# Patient Record
Sex: Male | Born: 1960 | Race: White | Hispanic: No | Marital: Married | Smoking: Current every day smoker
Health system: Southern US, Community
[De-identification: ages and names within clinical notes are randomized; demographics above are authoritative.]

## PROBLEM LIST (undated history)

## (undated) DIAGNOSIS — G459 Transient cerebral ischemic attack, unspecified: Secondary | ICD-10-CM

---

## 2006-01-21 ENCOUNTER — Ambulatory Visit: Payer: Self-pay | Admitting: Gastroenterology

## 2006-04-22 ENCOUNTER — Ambulatory Visit: Payer: Self-pay | Admitting: Gastroenterology

## 2006-06-04 ENCOUNTER — Encounter (INDEPENDENT_AMBULATORY_CARE_PROVIDER_SITE_OTHER): Payer: Self-pay | Admitting: Specialist

## 2006-06-04 ENCOUNTER — Ambulatory Visit: Admission: RE | Admit: 2006-06-04 | Discharge: 2006-06-04 | Payer: Self-pay | Admitting: Gastroenterology

## 2006-06-04 IMAGING — US US BIOPSY
1 series · 10 of 10 positions shown · non-contrast
Comparison: none

CLINICAL DATA: Hepatitis C.  Request is made for random core liver biopsy.
ULTRASOUND-GUIDED RANDOM CORE LIVER BIOPSY ? [DATE]:

[Series 1: unknown · 0.33mm/px · 10 of 10 slices shown]
[im 1/10]
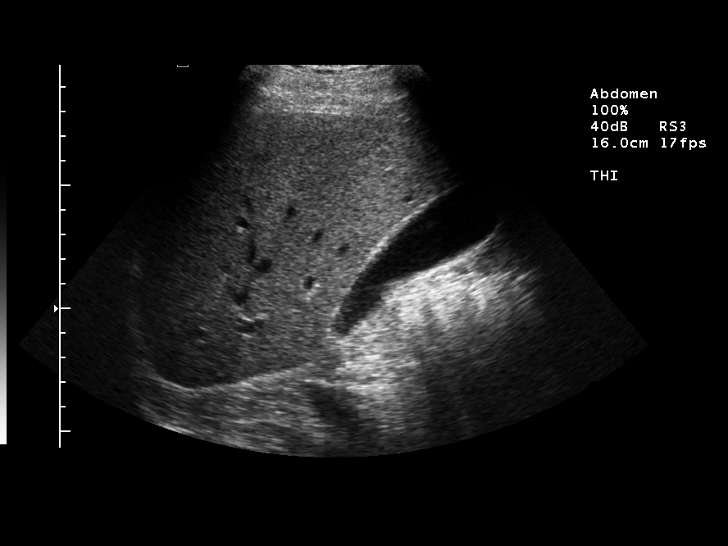
[im 2/10]
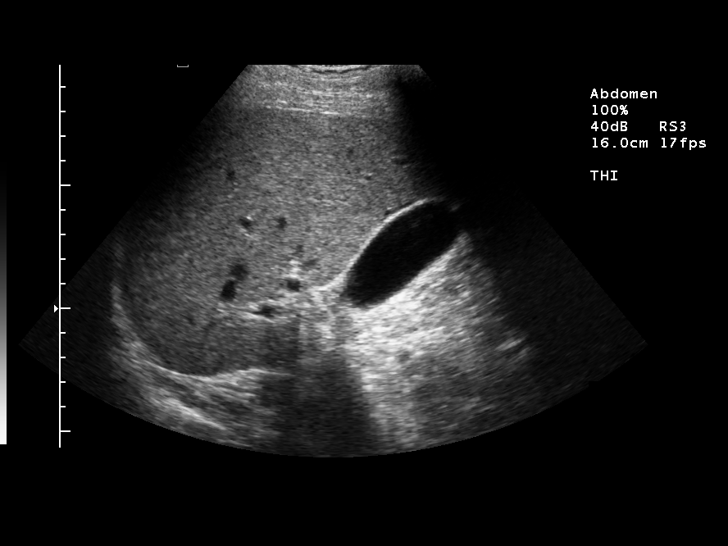
[im 3/10]
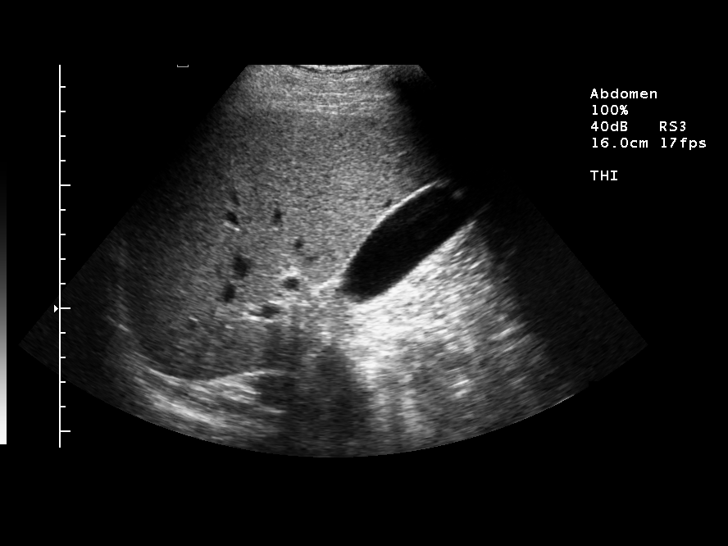
[im 4/10]
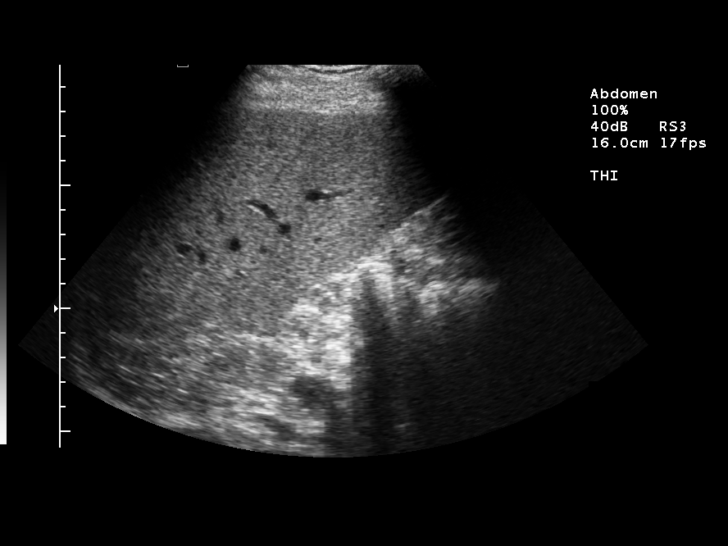
[im 5/10]
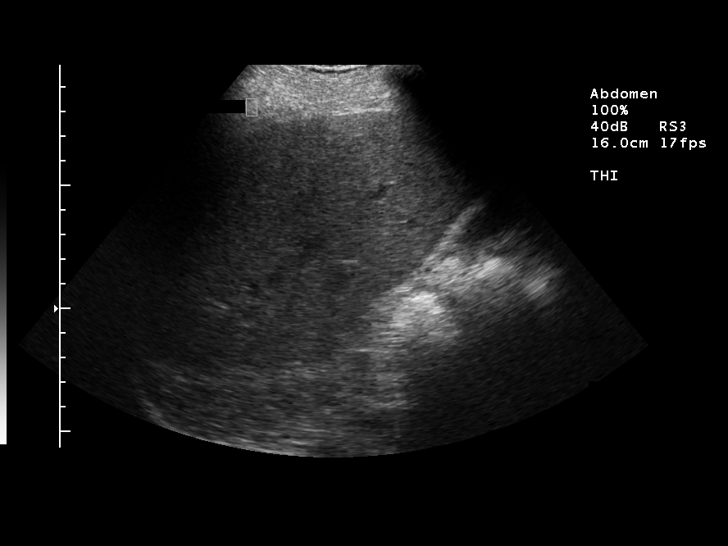
[im 6/10]
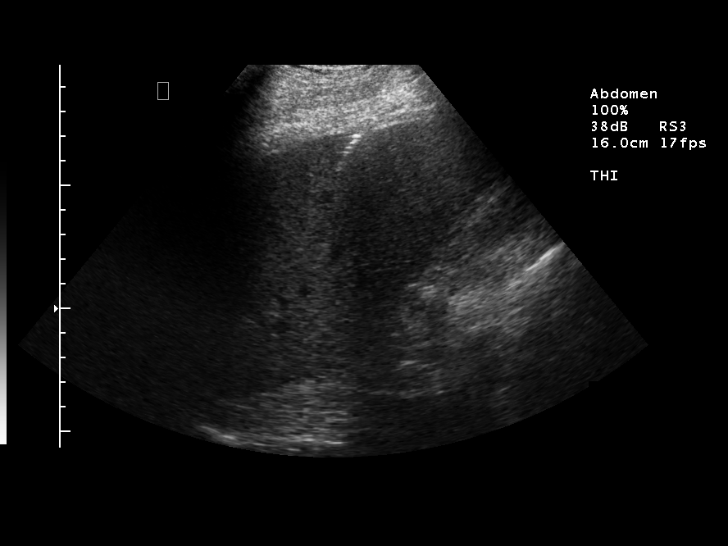
[im 7/10]
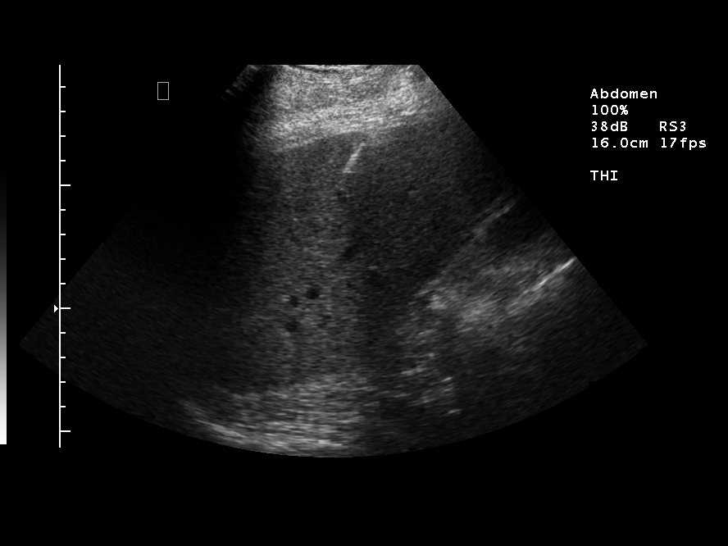
[im 8/10]
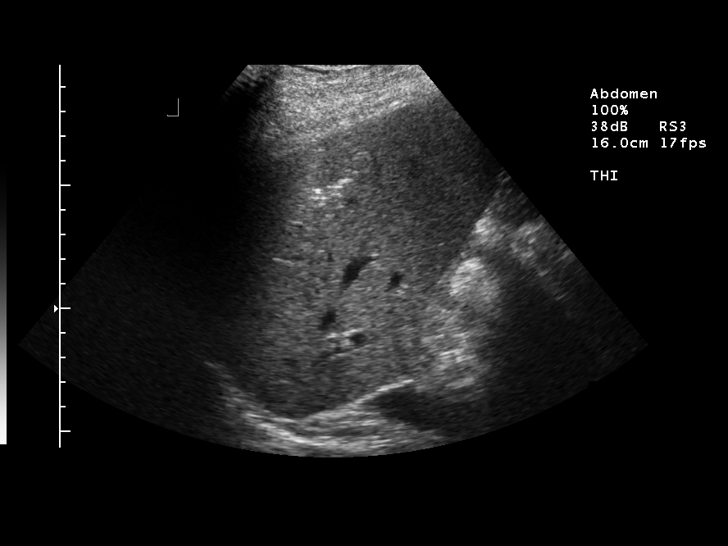
[im 9/10]
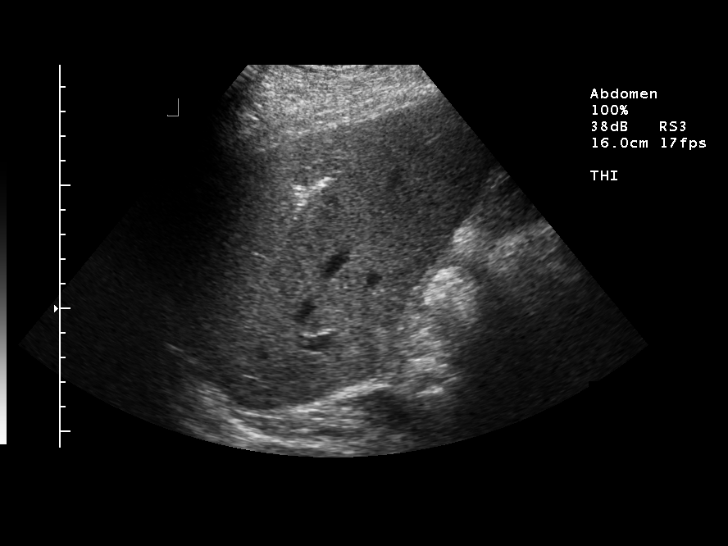
[im 10/10]
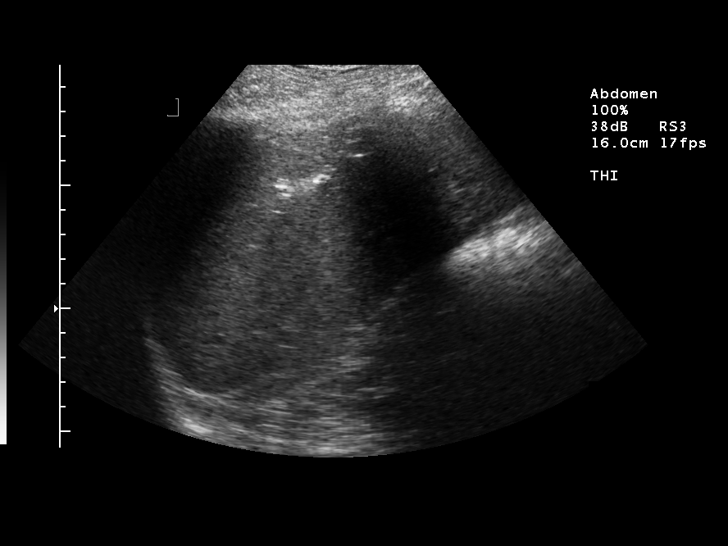

[10 of 10 positions shown; findings below may reference images not displayed]

FINDINGS: An ultrasound-guided random core liver biopsy was thoroughly discussed with the patient and questions were answered.  The benefits, risks, alternatives, and complications were also discussed.  The patient understands and wishes to proceed with the procedure.  Verbal as well as written consent was obtained.
Under ultrasound guidance, an appropriate skin site was marked.  The patient was then prepped and draped in the normal sterile fashion.  1% lidocaine was used for local anesthesia.  Through a 17-gauge guiding trocar, 4 passes were then made into the right hepatic lobe with an 18-gauge biopsy gun.  Ultrasound imaging confirmed appropriate needle placement in the liver parenchyma.  Specimens were sent to pathology for further evaluation.  The patient tolerated the procedure well and there were no immediate complications.  
Medications utilized:  Versed 4 mg IV, fentanyl 100 micrograms IV.  Cardiorespiratory monitoring was performed by the interventional radiology nurse during the procedure.  The patient?s vital signs remained stable throughout the procedure, and he will be observed in the [REDACTED] for an additional 3 hours post-biopsy and then discharged home afterwards if stable.
Total Time of Sedation:  20 minutes.
IMPRESSION: Successful ultrasound-guided random core liver biopsy of the right hepatic lobe as discussed above.

## 2006-07-08 ENCOUNTER — Ambulatory Visit: Payer: Self-pay | Admitting: Gastroenterology

## 2006-10-02 ENCOUNTER — Ambulatory Visit: Payer: Self-pay | Admitting: Gastroenterology

## 2006-11-13 ENCOUNTER — Ambulatory Visit: Payer: Self-pay | Admitting: Gastroenterology

## 2007-01-06 ENCOUNTER — Ambulatory Visit: Payer: Self-pay | Admitting: Gastroenterology

## 2007-04-23 ENCOUNTER — Ambulatory Visit: Payer: Self-pay | Admitting: Gastroenterology

## 2007-04-30 ENCOUNTER — Ambulatory Visit: Payer: Self-pay | Admitting: Gastroenterology

## 2007-05-14 ENCOUNTER — Ambulatory Visit: Payer: Self-pay | Admitting: Gastroenterology

## 2007-05-28 ENCOUNTER — Ambulatory Visit: Payer: Self-pay | Admitting: Gastroenterology

## 2007-06-11 ENCOUNTER — Ambulatory Visit: Payer: Self-pay | Admitting: Gastroenterology

## 2007-07-09 ENCOUNTER — Ambulatory Visit: Payer: Self-pay | Admitting: Gastroenterology

## 2007-08-06 ENCOUNTER — Ambulatory Visit: Payer: Self-pay | Admitting: Gastroenterology

## 2007-09-03 ENCOUNTER — Ambulatory Visit: Payer: Self-pay | Admitting: Gastroenterology

## 2007-10-01 ENCOUNTER — Ambulatory Visit: Payer: Self-pay | Admitting: Gastroenterology

## 2007-10-29 ENCOUNTER — Ambulatory Visit: Payer: Self-pay | Admitting: Gastroenterology

## 2007-11-26 ENCOUNTER — Ambulatory Visit: Payer: Self-pay | Admitting: Gastroenterology

## 2007-12-24 ENCOUNTER — Ambulatory Visit: Payer: Self-pay | Admitting: Gastroenterology

## 2008-01-26 ENCOUNTER — Ambulatory Visit: Payer: Self-pay | Admitting: Gastroenterology

## 2008-03-03 ENCOUNTER — Ambulatory Visit: Payer: Self-pay | Admitting: Gastroenterology

## 2008-06-30 ENCOUNTER — Ambulatory Visit: Payer: Self-pay | Admitting: Gastroenterology

## 2009-05-11 ENCOUNTER — Ambulatory Visit: Payer: Self-pay | Admitting: Gastroenterology

## 2019-09-09 ENCOUNTER — Encounter (HOSPITAL_COMMUNITY): Payer: Self-pay

## 2019-09-09 ENCOUNTER — Emergency Department (HOSPITAL_COMMUNITY): Payer: Worker's Compensation

## 2019-09-09 ENCOUNTER — Emergency Department (HOSPITAL_COMMUNITY)
Admission: EM | Admit: 2019-09-09 | Discharge: 2019-09-09 | Disposition: A | Discharge status: Home / Self Care | Payer: Worker's Compensation | Attending: Emergency Medicine | Admitting: Emergency Medicine

## 2019-09-09 DIAGNOSIS — S4991XA Unspecified injury of right shoulder and upper arm, initial encounter: Secondary | ICD-10-CM | POA: Insufficient documentation

## 2019-09-09 DIAGNOSIS — Y929 Unspecified place or not applicable: Secondary | ICD-10-CM | POA: Diagnosis not present

## 2019-09-09 DIAGNOSIS — M25511 Pain in right shoulder: Secondary | ICD-10-CM | POA: Insufficient documentation

## 2019-09-09 DIAGNOSIS — Y9389 Activity, other specified: Secondary | ICD-10-CM | POA: Diagnosis not present

## 2019-09-09 DIAGNOSIS — S0083XA Contusion of other part of head, initial encounter: Secondary | ICD-10-CM | POA: Insufficient documentation

## 2019-09-09 DIAGNOSIS — W010XXA Fall on same level from slipping, tripping and stumbling without subsequent striking against object, initial encounter: Secondary | ICD-10-CM | POA: Diagnosis not present

## 2019-09-09 DIAGNOSIS — Z7982 Long term (current) use of aspirin: Secondary | ICD-10-CM | POA: Insufficient documentation

## 2019-09-09 DIAGNOSIS — Y990 Civilian activity done for income or pay: Secondary | ICD-10-CM | POA: Insufficient documentation

## 2019-09-09 DIAGNOSIS — S0990XA Unspecified injury of head, initial encounter: Secondary | ICD-10-CM

## 2019-09-09 DIAGNOSIS — W19XXXA Unspecified fall, initial encounter: Secondary | ICD-10-CM

## 2019-09-09 DIAGNOSIS — Z8673 Personal history of transient ischemic attack (TIA), and cerebral infarction without residual deficits: Secondary | ICD-10-CM | POA: Insufficient documentation

## 2019-09-09 HISTORY — DX: Transient cerebral ischemic attack, unspecified: G45.9

## 2019-09-09 LAB — CBC
HCT: 40.7 % (ref 39.0–52.0)
Hemoglobin: 13.1 g/dL (ref 13.0–17.0)
MCH: 31.4 pg (ref 26.0–34.0)
MCHC: 32.2 g/dL (ref 30.0–36.0)
MCV: 97.6 fL (ref 80.0–100.0)
Platelets: 195 10*3/uL (ref 150–400)
RBC: 4.17 MIL/uL — ABNORMAL LOW (ref 4.22–5.81)
RDW: 13.8 % (ref 11.5–15.5)
WBC: 4.6 10*3/uL (ref 4.0–10.5)
nRBC: 0 % (ref 0.0–0.2)

## 2019-09-09 LAB — COMPREHENSIVE METABOLIC PANEL
ALT: 21 U/L (ref 0–44)
AST: 19 U/L (ref 15–41)
Albumin: 3.6 g/dL (ref 3.5–5.0)
Alkaline Phosphatase: 68 U/L (ref 38–126)
Anion gap: 10 (ref 5–15)
BUN: 17 mg/dL (ref 6–20)
CO2: 23 mmol/L (ref 22–32)
Calcium: 9 mg/dL (ref 8.9–10.3)
Chloride: 103 mmol/L (ref 98–111)
Creatinine, Ser: 1.15 mg/dL (ref 0.61–1.24)
GFR calc Af Amer: >60 mL/min (ref >60)
GFR calc non Af Amer: >60 mL/min (ref >60)
Glucose, Bld: 123 mg/dL — ABNORMAL HIGH (ref 70–99)
Potassium: 4.2 mmol/L (ref 3.5–5.1)
Sodium: 136 mmol/L (ref 135–145)
Total Bilirubin: 0.7 mg/dL (ref 0.3–1.2)
Total Protein: 6.9 g/dL (ref 6.5–8.1)

## 2019-09-09 LAB — PROTIME-INR
INR: 1.1 (ref 0.8–1.2)
Prothrombin Time: 13.8 seconds (ref 11.4–15.2)

## 2019-09-09 LAB — I-STAT CHEM 8, ED
BUN: 19 mg/dL (ref 6–20)
Calcium, Ion: 1.15 mmol/L (ref 1.15–1.40)
Chloride: 101 mmol/L (ref 98–111)
Creatinine, Ser: 1.1 mg/dL (ref 0.61–1.24)
Glucose, Bld: 120 mg/dL — ABNORMAL HIGH (ref 70–99)
HCT: 38 % — ABNORMAL LOW (ref 39.0–52.0)
Hemoglobin: 12.9 g/dL — ABNORMAL LOW (ref 13.0–17.0)
Potassium: 4.1 mmol/L (ref 3.5–5.1)
Sodium: 137 mmol/L (ref 135–145)
TCO2: 24 mmol/L (ref 22–32)

## 2019-09-09 LAB — DIFFERENTIAL
Abs Immature Granulocytes: 0.01 10*3/uL (ref 0.00–0.07)
Basophils Absolute: 0.1 10*3/uL (ref 0.0–0.1)
Basophils Relative: 1 %
Eosinophils Absolute: 0.1 10*3/uL (ref 0.0–0.5)
Eosinophils Relative: 2 %
Immature Granulocytes: 0 %
Lymphocytes Relative: 32 %
Lymphs Abs: 1.5 10*3/uL (ref 0.7–4.0)
Monocytes Absolute: 0.6 10*3/uL (ref 0.1–1.0)
Monocytes Relative: 13 %
Neutro Abs: 2.4 10*3/uL (ref 1.7–7.7)
Neutrophils Relative %: 52 %

## 2019-09-09 LAB — APTT: aPTT: 27 seconds (ref 24–36)

## 2019-09-09 IMAGING — CT CT HEAD W/O CM
3 series · 15 of 47 positions shown, 18 images · non-contrast
Comparison: None.

CLINICAL DATA: Head injury at work yesterday. No loss of
consciousness.

EXAM:
CT HEAD WITHOUT CONTRAST
TECHNIQUE: Contiguous axial images were obtained from the base of the skull
through the vertex without intravenous contrast.

[Series 3: head 5.0 h30s · axial · 0.44mm/px · z∈[-74,+61]mm · 9 of 33 slices shown, 12 images]
[im 3/33  brain]
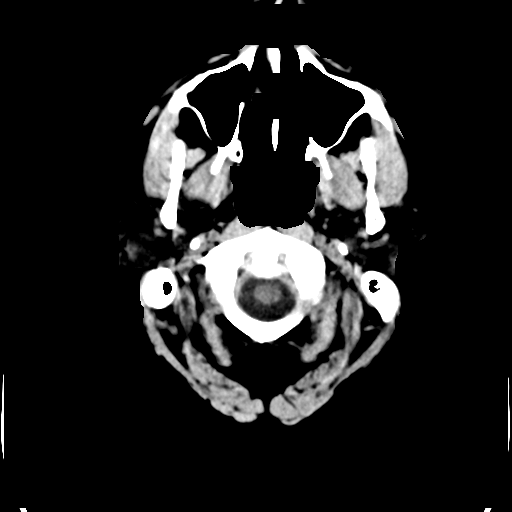
[im 3/33  bone]
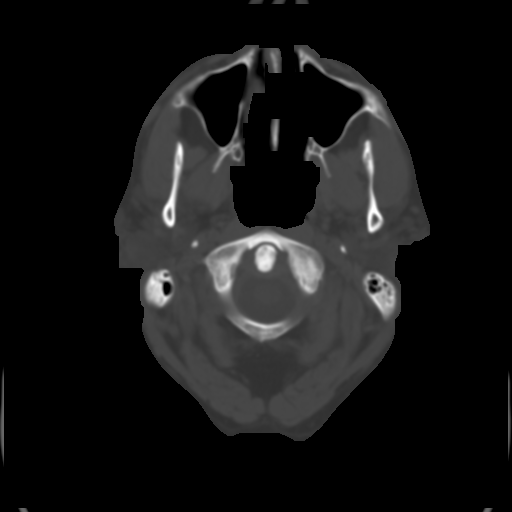
[im 6/33  brain]
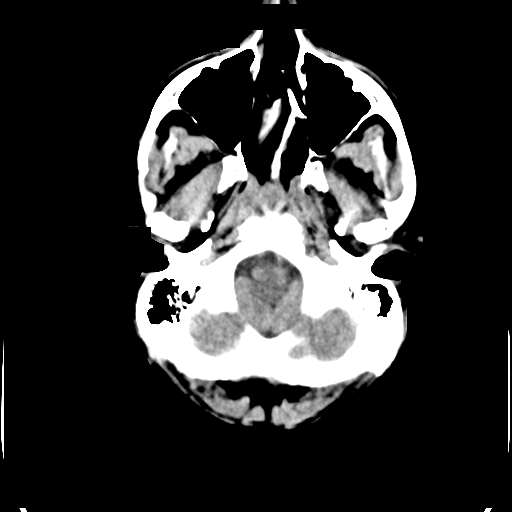
[im 9/33  brain]
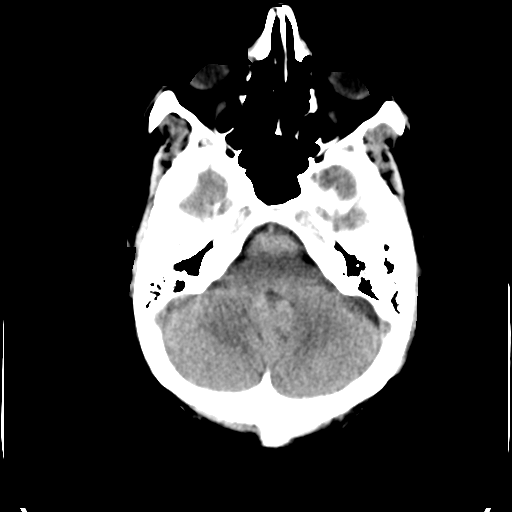
[im 13/33  brain]
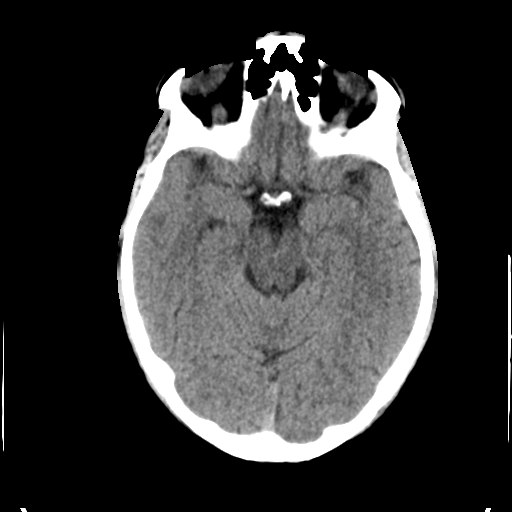
[im 17/33  brain]
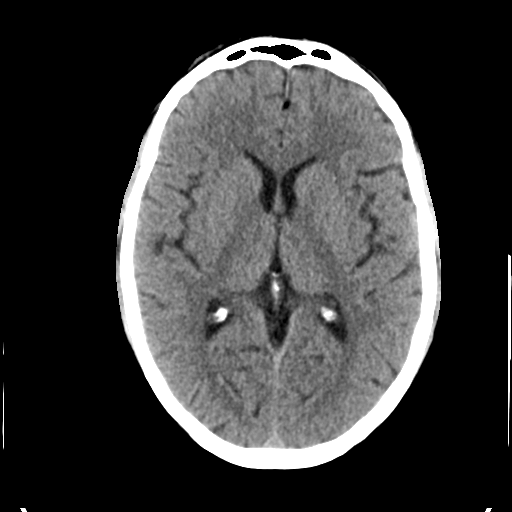
[im 17/33  bone]
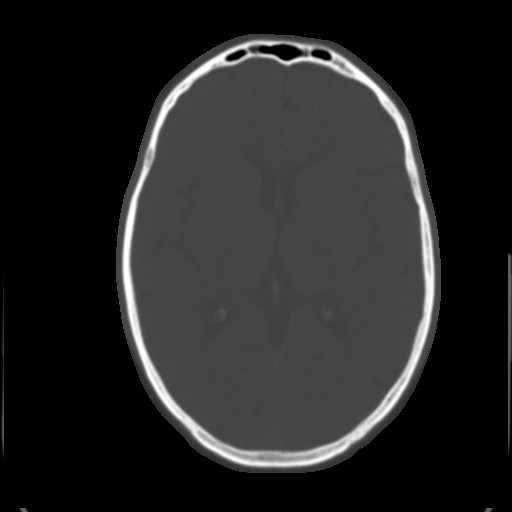
[im 20/33  brain]
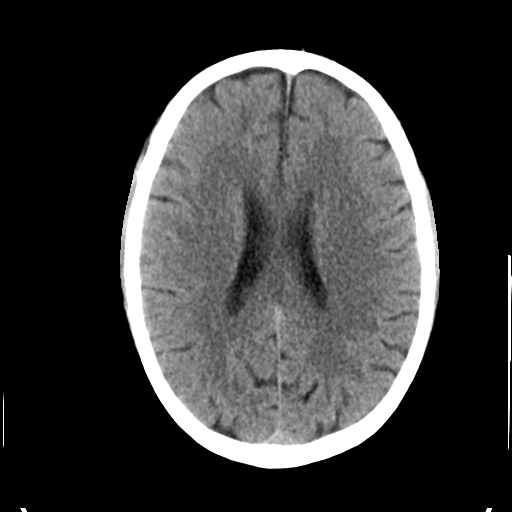
[im 24/33  brain]
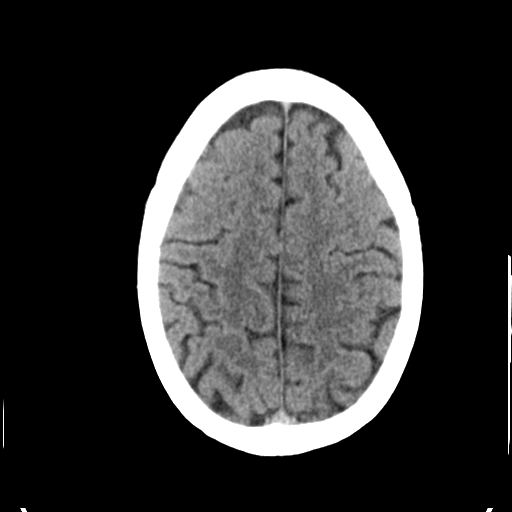
[im 27/33  brain]
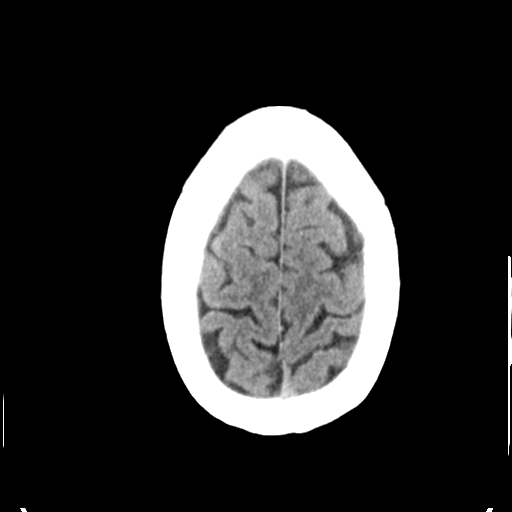
[im 30/33  brain]
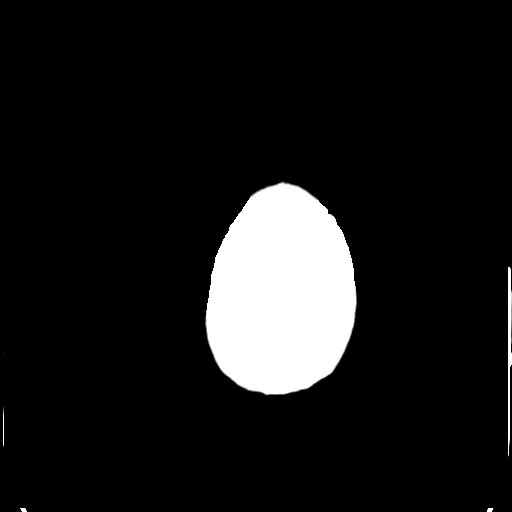
[im 30/33  bone]
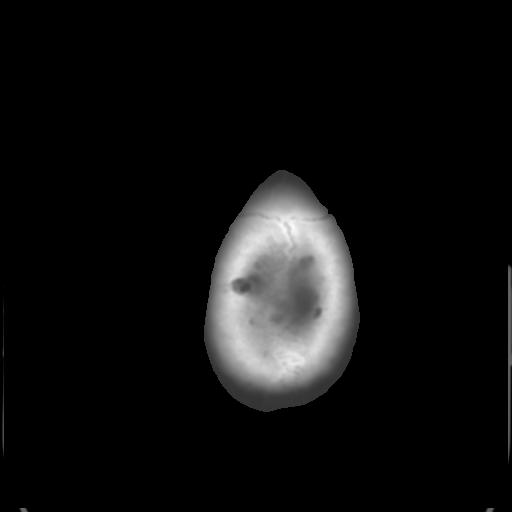

[Series 5: head 3.0 mpr cor · coronal · 0.32mm/px · 3 of 75 slices shown]
[im 25/75  brain]
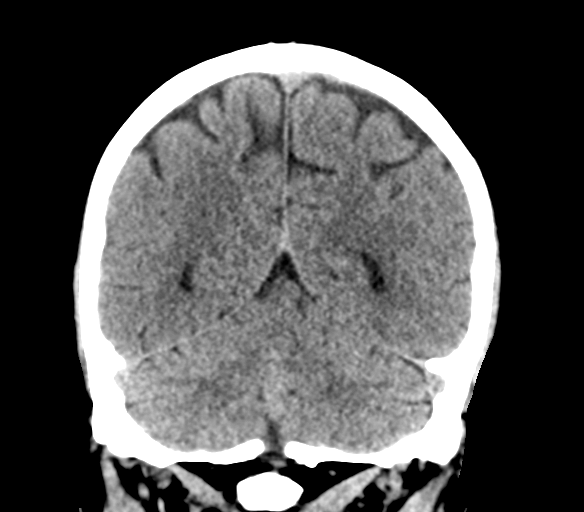
[im 33/75  brain]
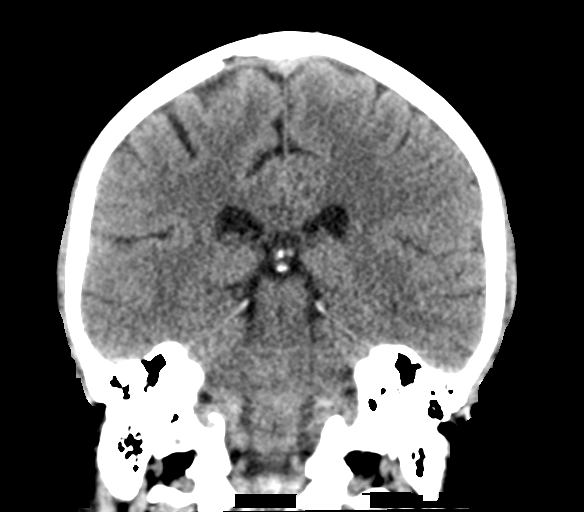
[im 42/75  brain]
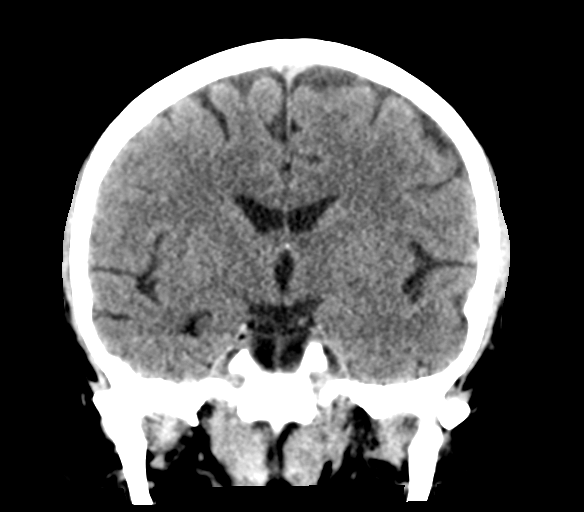

[Series 6: head 3.0 mpr sag · sagittal · 0.32mm/px · 3 of 67 slices shown]
[im 23/67  brain]
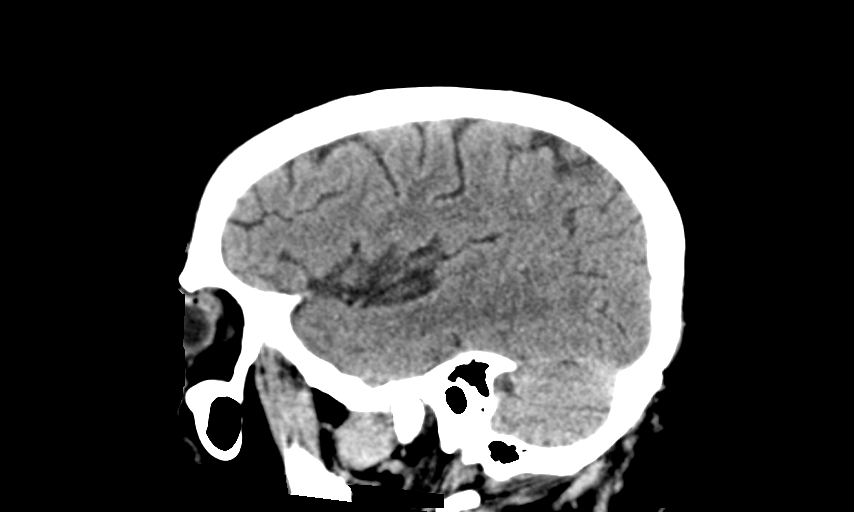
[im 34/67  brain]
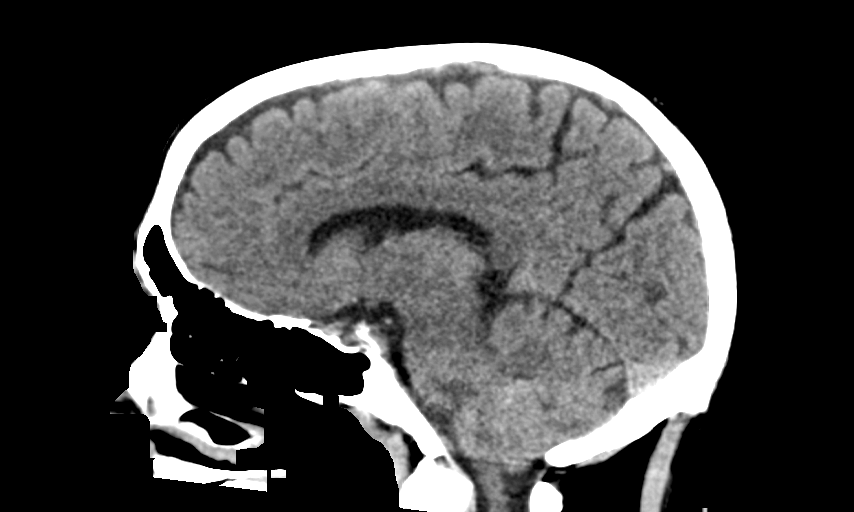
[im 45/67  brain]
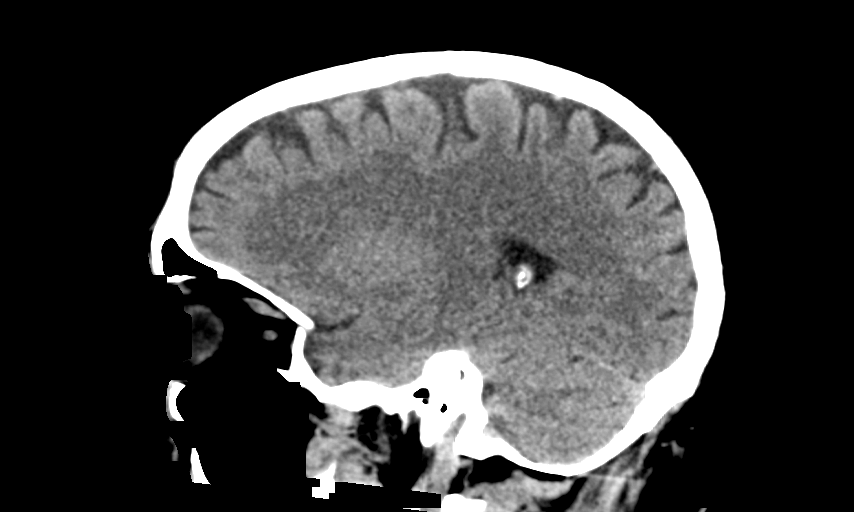

[15 of 47 positions shown; findings below may reference images not displayed]

FINDINGS: Brain: No evidence of acute infarction, hemorrhage, hydrocephalus,
extra-axial collection or mass lesion/mass effect.

Vascular: No hyperdense vessel or unexpected calcification.

Skull: Normal. Negative for fracture or focal lesion.

Sinuses/Orbits: No acute finding.

Other: None.
IMPRESSION: Normal head CT.

## 2019-09-09 IMAGING — DX DG SHOULDER 2+V*R*
3 series · 3 of 3 positions shown · non-contrast
Comparison: No recent.

CLINICAL DATA: Fall.

EXAM:
RIGHT SHOULDER - 2+ VIEW

[w shoulder external right]
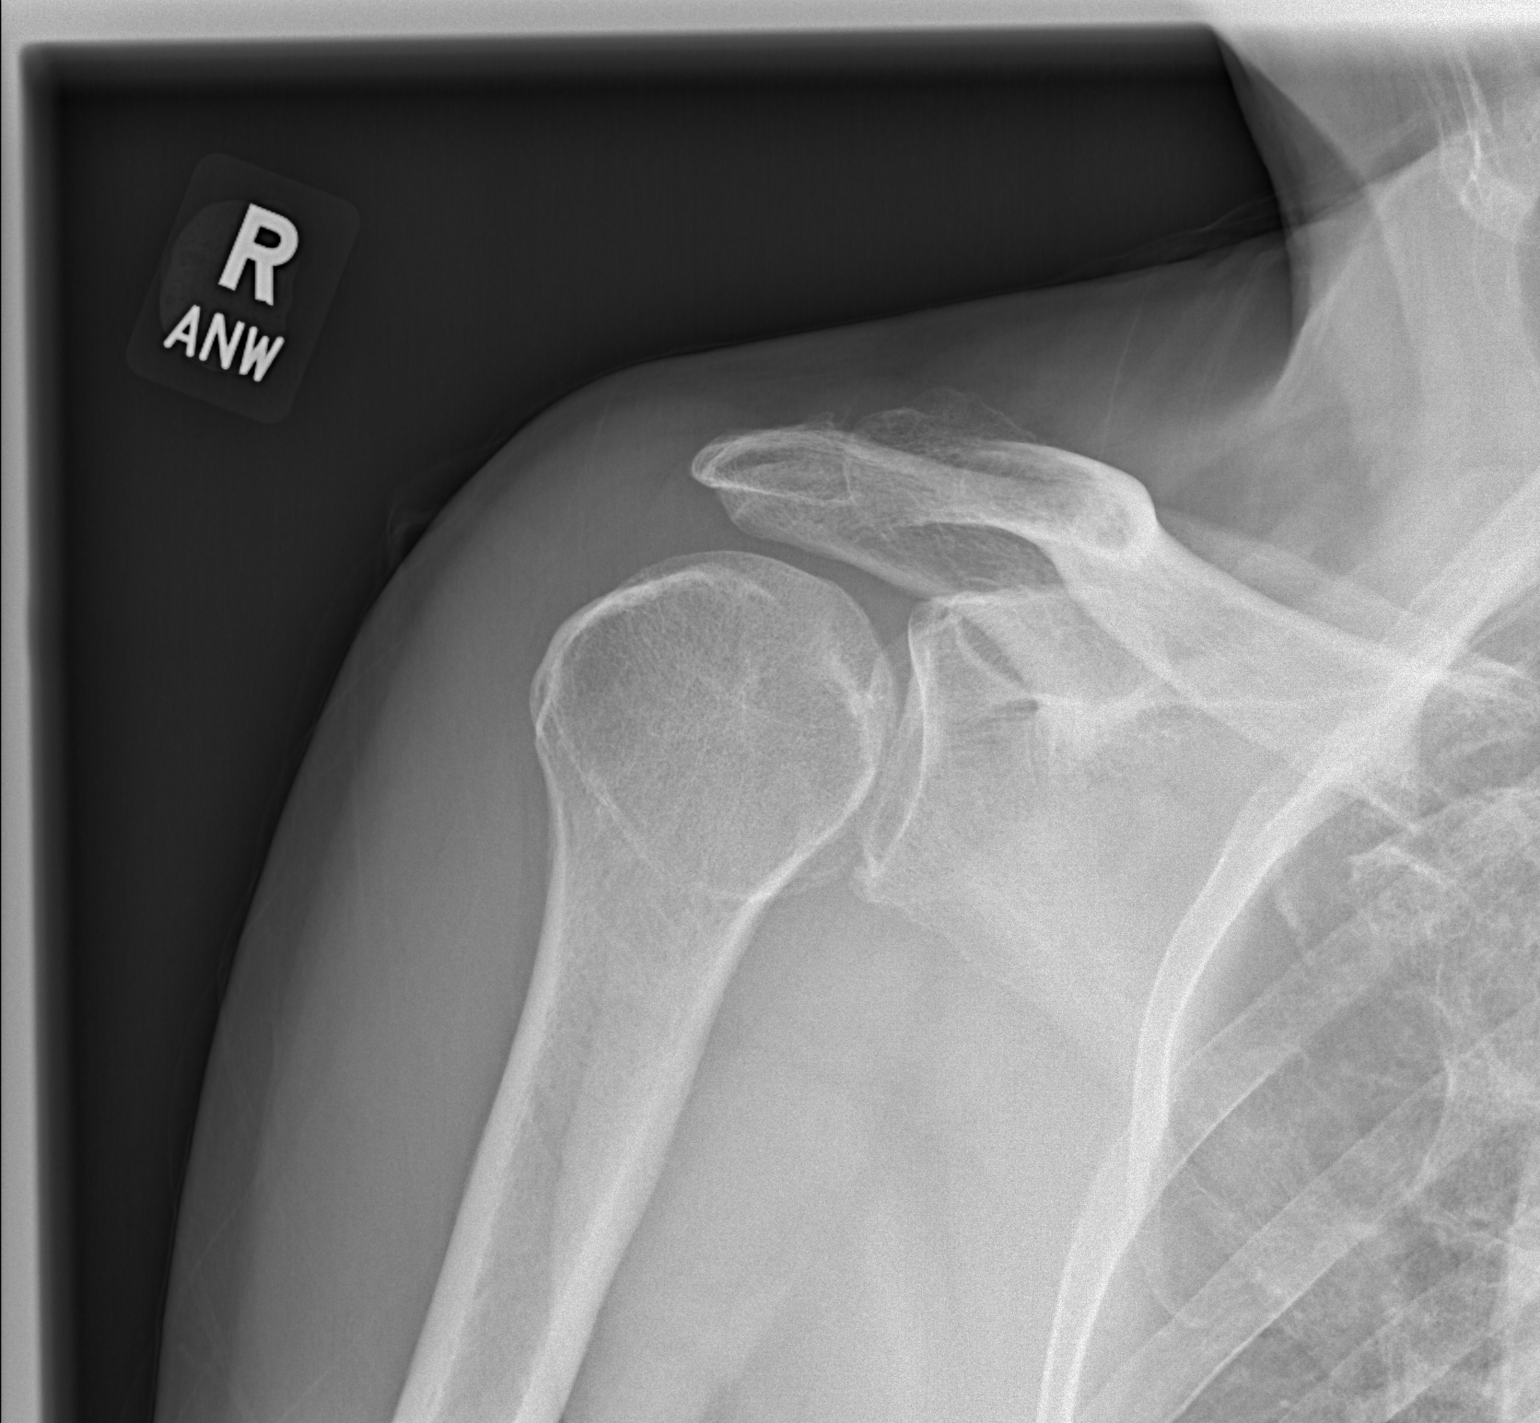

[w shoulder y-view right]
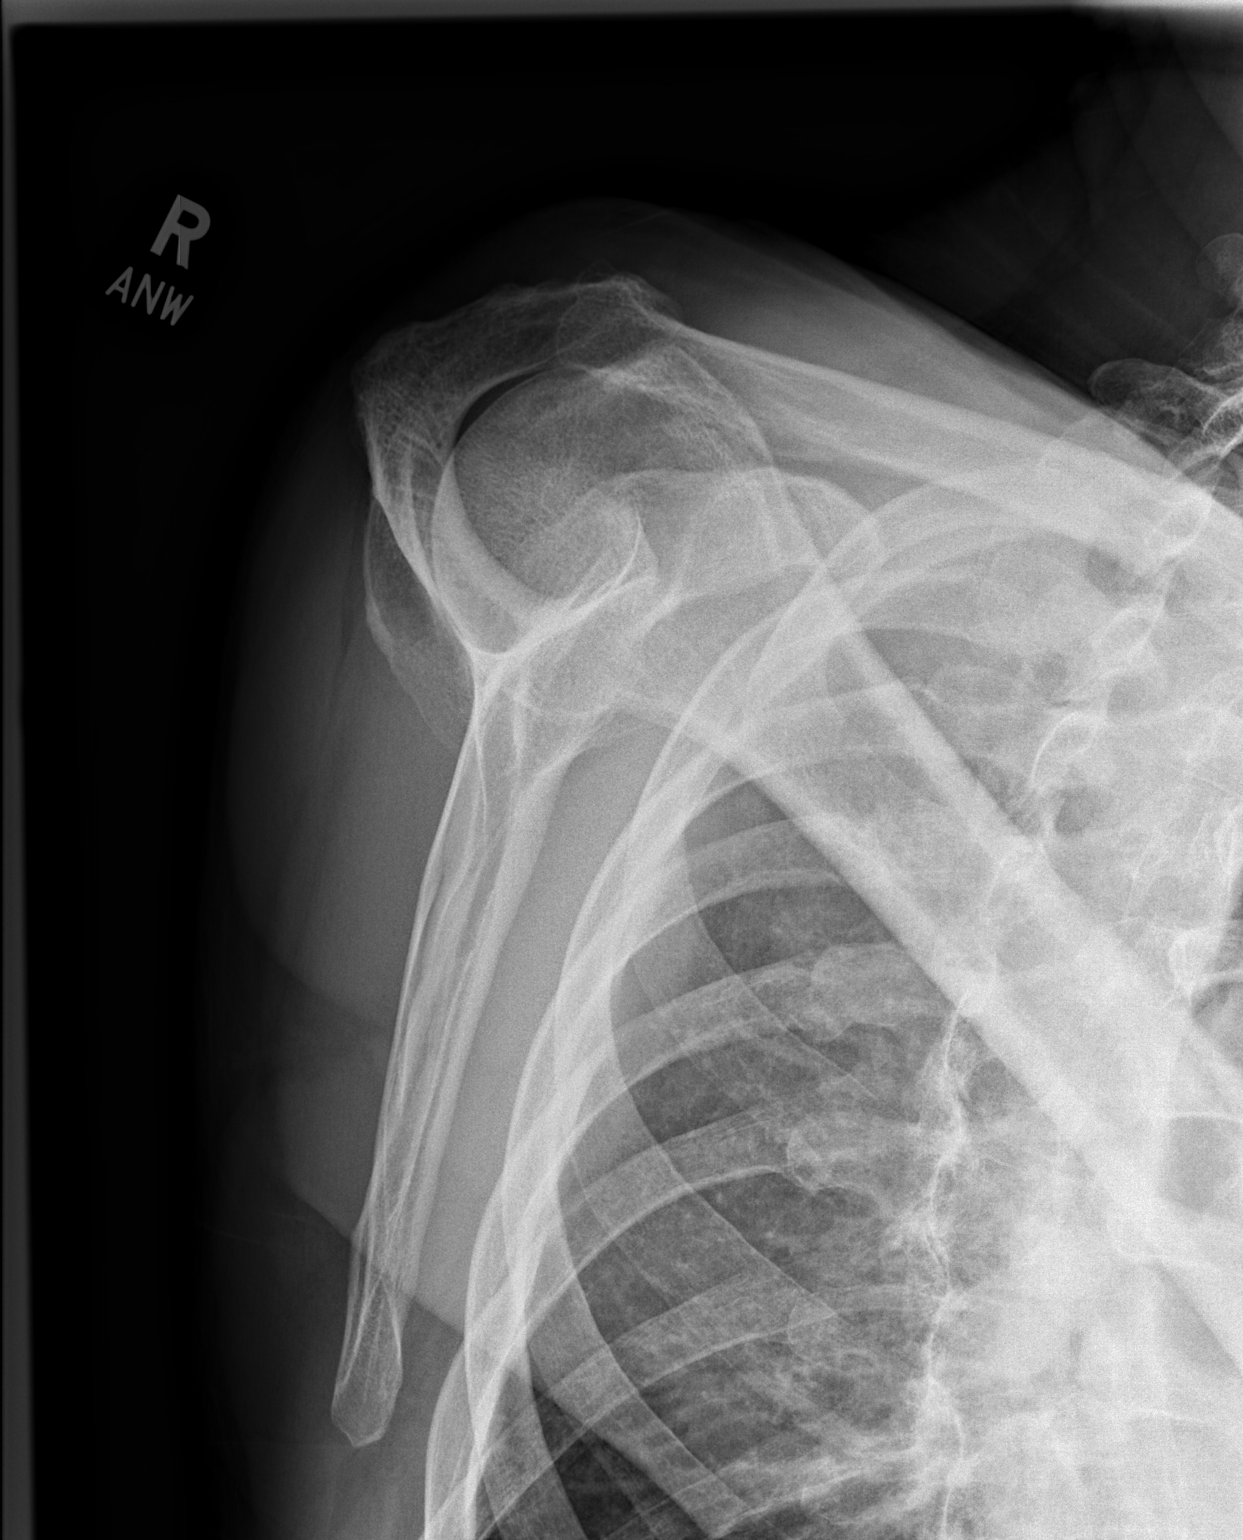

[w shoulder axillary right]
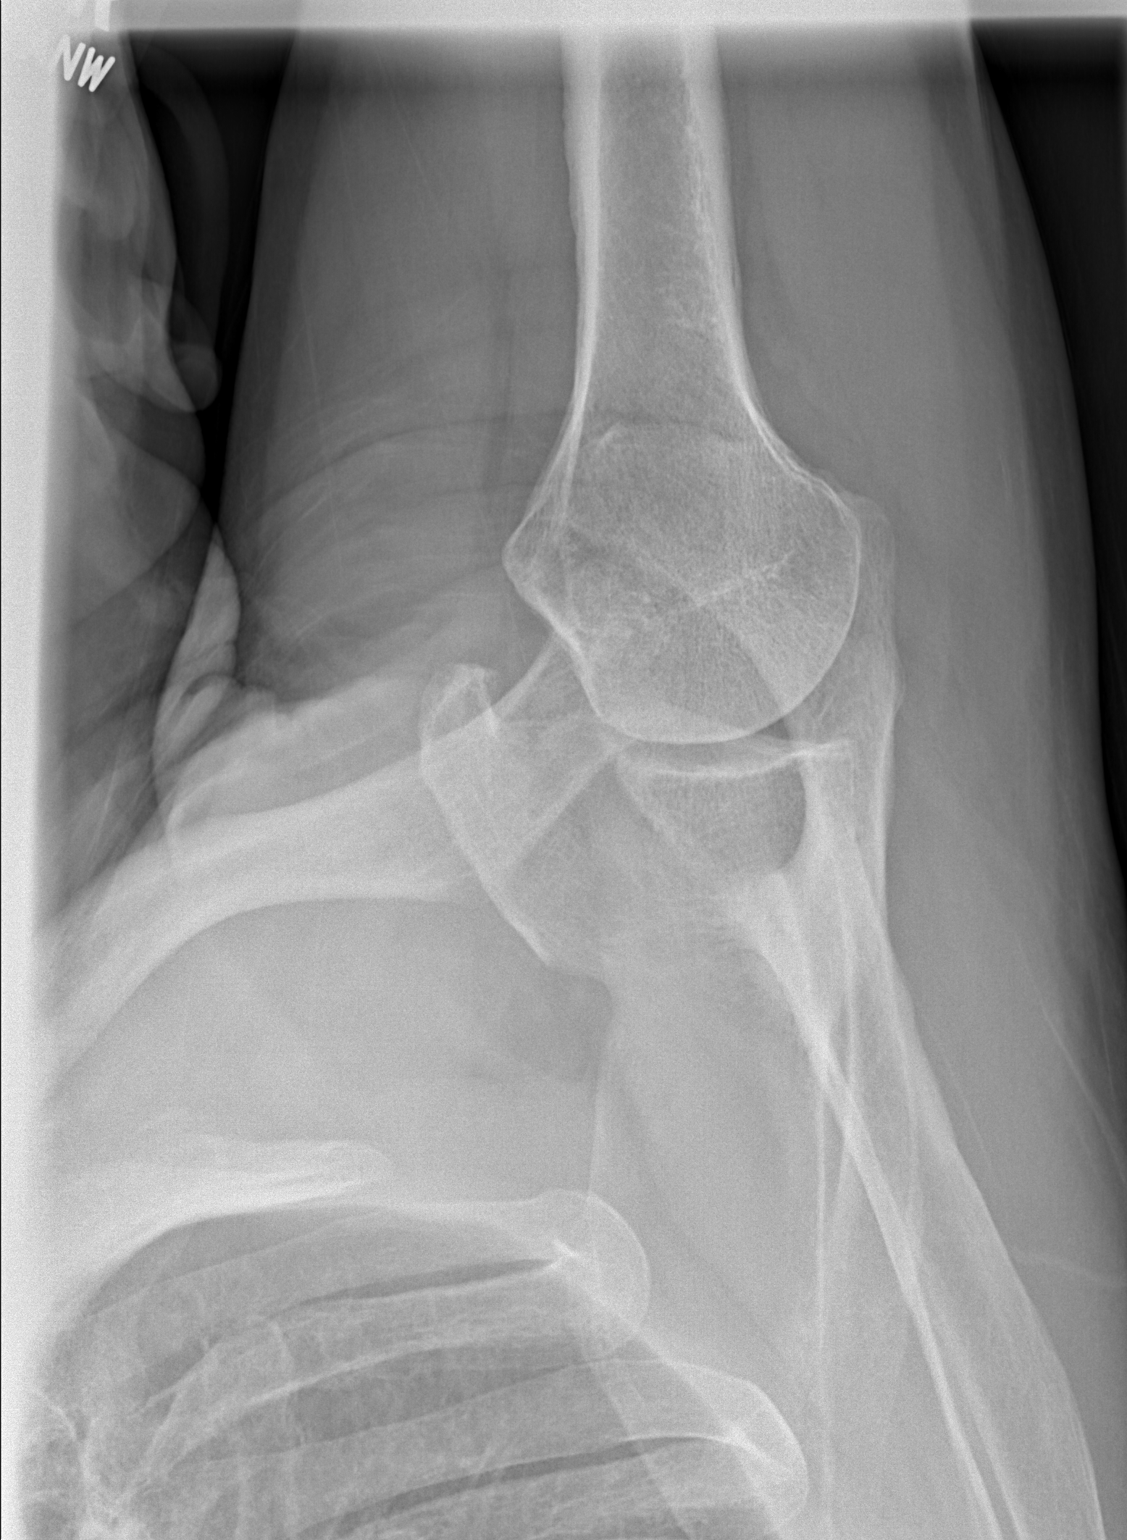

[3 of 3 positions shown; findings below may reference images not displayed]

FINDINGS: Acromioclavicular glenohumeral degenerative change. No acute
abnormality identified. No evidence of fracture. No evidence of
dislocation or separation.
IMPRESSION: Acromioclavicular glenohumeral degenerative change. No acute
abnormality.

## 2019-09-09 MED ORDER — SODIUM CHLORIDE 0.9% FLUSH
3.0000 mL | Freq: Once | INTRAVENOUS | Status: DC
Start: 2019-09-09 — End: 2019-09-10

## 2019-09-09 NOTE — ED Triage Notes (Signed)
Pt arrives to ED w/ c/o mechanical fall yesterday. Pt hit head on cement, denies loc. Pt takes 324 mg aspirin daily and since then c/o difficulty concentrating. Small hematoma noted to R side of head. Pt also c/o R sided shoulder pain that worsens w/ movement. Pt AOx4, neuro intact.

## 2019-09-09 NOTE — ED Provider Notes (Signed)
[facility] Provider Note   CSN: [id number] Arrival date & time: 09/09/19  1119     History Chief Complaint  Patient presents with  . Fall    Brian Ochoa is a 59 y.o. male.  HPI Patient is a 59 year old male with a history as noted below who presents due to a mechanical fall that occurred yesterday.  Patient works as a Development worker, community.  He states that he tripped over an object and struck his right shoulder and forehead on a cement surface.  No LOC at the time.  He notes a history of TIA and takes 324 mg of aspirin daily for this.  He woke this morning and felt that he was having difficulty concentrating.  He states that he seemed a bit forgetful this morning and due to this decided to come to the emergency department for evaluation.  Since arrival he feels that his symptoms have alleviated.  He notes some mild right-sided head pain as well as diffuse right shoulder pain that worsens with right upper extremity movement.  He has not taken anything for his symptoms.  He denies chest pain, shortness of breath, abdominal pain, nausea, vomiting, dizziness, lightheadedness, visual changes, bowel or bladder incontinence, numbness, tingling.    Past Medical History:  Diagnosis Date  . TIA (transient ischemic attack)     There are no problems to display for this patient.   History reviewed. No pertinent surgical history.     No family history on file.  Social History   Tobacco Use  . Smoking status: Not on file  Substance Use Topics  . Alcohol use: Not on file  . Drug use: Not on file    Home Medications Prior to Admission medications   Not on File    Allergies    Patient has no allergy information on record.  Review of Systems   Review of Systems  All other systems reviewed and are negative. Ten systems reviewed and are negative for acute change, except as noted in the HPI.    Physical Exam Updated Vital Signs BP 117/73 (BP Location:  Right Arm)   Pulse 76   Temp 98.3 F (36.8 C) (Oral)   Resp 16   Ht 5\' 11"  (1.803 m)   Wt 86.2 kg   SpO2 98%   BMI 26.50 kg/m   Physical Exam Vitals and nursing note reviewed.  Constitutional:      General: He is not in acute distress.    Appearance: Normal appearance. He is not ill-appearing, toxic-appearing or diaphoretic.  HENT:     Head: Normocephalic.     Comments: Moderate hematoma with overlying abrasion noted to the right forehead.    Right Ear: External ear normal.     Left Ear: External ear normal.     Nose: Nose normal.     Mouth/Throat:     Mouth: Mucous membranes are moist.     Pharynx: Oropharynx is clear. No oropharyngeal exudate or posterior oropharyngeal erythema.  Eyes:     General: No scleral icterus.       Right eye: No discharge.        Left eye: No discharge.     Extraocular Movements: Extraocular movements intact.     Conjunctiva/sclera: Conjunctivae normal.     Pupils: Pupils are equal, round, and reactive to light.  Neck:     Comments: No midline C, T, L-spine tenderness. Cardiovascular:     Rate and Rhythm: Normal rate and regular  rhythm.     Pulses: Normal pulses.     Heart sounds: Normal heart sounds. No murmur heard.  No friction rub. No gallop.   Pulmonary:     Effort: Pulmonary effort is normal. No respiratory distress.     Breath sounds: Normal breath sounds. No stridor. No wheezing, rhonchi or rales.  Chest:     Chest wall: No tenderness.  Abdominal:     General: Abdomen is flat.     Tenderness: There is no abdominal tenderness.  Musculoskeletal:        General: Normal range of motion.     Cervical back: Normal range of motion and neck supple. No tenderness.  Skin:    General: Skin is warm and dry.  Neurological:     General: No focal deficit present.     Mental Status: He is alert and oriented to person, place, and time.     Comments: Patient is oriented to person, place, time.  Patient is phonating clearly and coherently and  speaks in complete sentences.  Negative pronator drift.  Finger-to-nose intact bilaterally with no visible signs of dysmetria.  Patient is able to ambulate with a steady gait.  Distal sensation intact in all 4 extremities.  Strength is 5 out of 5 in all 4 extremities.  Psychiatric:        Mood and Affect: Mood normal.        Behavior: Behavior normal.    ED Results / Procedures / Treatments   Labs (all labs ordered are listed, but only abnormal results are displayed) Labs Reviewed  CBC - Abnormal; Notable for the following components:      Result Value   RBC 4.17 (*)    All other components within normal limits  COMPREHENSIVE METABOLIC PANEL - Abnormal; Notable for the following components:   Glucose, Bld 123 (*)    All other components within normal limits  I-STAT CHEM 8, ED - Abnormal; Notable for the following components:   Glucose, Bld 120 (*)    Hemoglobin 12.9 (*)    HCT 38.0 (*)    All other components within normal limits  PROTIME-INR  APTT  DIFFERENTIAL  CBG MONITORING, ED    EKG None  Radiology DG Shoulder Right  Result Date: 09/09/2019 CLINICAL DATA:  Fall. EXAM: RIGHT SHOULDER - 2+ VIEW COMPARISON:  No recent. FINDINGS: Acromioclavicular glenohumeral degenerative change. No acute abnormality identified. No evidence of fracture. No evidence of dislocation or separation. IMPRESSION: Acromioclavicular glenohumeral degenerative change. No acute abnormality. Electronically Signed   By: Marcello Moores  Register   On: 09/09/2019 12:07   CT HEAD WO CONTRAST  Result Date: 09/09/2019 CLINICAL DATA:  Head injury at work yesterday. No loss of consciousness. EXAM: CT HEAD WITHOUT CONTRAST TECHNIQUE: Contiguous axial images were obtained from the base of the skull through the vertex without intravenous contrast. COMPARISON:  None. FINDINGS: Brain: No evidence of acute infarction, hemorrhage, hydrocephalus, extra-axial collection or mass lesion/mass effect. Vascular: No hyperdense vessel  or unexpected calcification. Skull: Normal. Negative for fracture or focal lesion. Sinuses/Orbits: No acute finding. Other: None. IMPRESSION: Normal head CT. Electronically Signed   By: Marijo Conception M.D.   On: 09/09/2019 13:53    Procedures Procedures   Medications Ordered in ED Medications  sodium chloride flush (NS) 0.9 % injection 3 mL (has no administration in time range)    ED Course  I have reviewed the triage vital signs and the nursing notes.  Pertinent labs & imaging results that  were available during my care of the patient were reviewed by me and considered in my medical decision making (see chart for details).    MDM Rules/Calculators/A&P                          Patient is a pleasant 59 year old male that presents 1 day status post a mechanical fall.  Initial labs obtained in triage and are generally reassuring.  He has mildly anemic at 12.9.  Mildly hyperglycemic at 120.  CT head without contrast was obtained and was negative for any acute intracranial abnormalities.  X-ray of the right shoulder showed degenerative changes but nothing acute.  I discussed this with the patient.  His symptoms seem likely postconcussive in nature.  Due to his medical history of TIA, patient was given strict return precautions.  Additionally recommended that he follow-up with his primary care provider tomorrow morning regarding this visit to discuss his symptoms.  He and his wife are present and they understand he needs to return to the emergency department with any new or worsening symptoms.  We discussed signs and symptoms of intracranial injuries.  I recommended continued use of Tylenol for management of his right shoulder pain.  Range of motion as tolerated.  Ice/heat as needed.  Their questions were answered and they were amicable at the time of discharge.  His vital signs are stable.  Patient discharged to home/self care.  Condition at discharge: Stable  Note: Portions of this report  may have been transcribed using voice recognition software. Every effort was made to ensure accuracy; however, inadvertent computerized transcription errors may be present.    Final Clinical Impression(s) / ED Diagnoses Final diagnoses:  Fall, initial encounter  Acute pain of right shoulder  Injury of head, initial encounter   Rx / DC Orders ED Discharge Orders    None       Rayna Sexton, PA-C 09/09/19 [id number]    Tegeler, Gwenyth Allegra, MD 09/10/19 [phone]

## 2019-09-09 NOTE — Discharge Instructions (Addendum)
Per our discussion, I would continue to take Tylenol as needed for management of your pain.  I would apply ice/heat to your right shoulder as tolerated.  Continue to move the right upper extremity as your pain tolerates.  Please follow-up with your primary care provider tomorrow morning to discuss this visit as well as her symptoms.  If your symptoms worsen please do not hesitate to return to the emergency department for reevaluation.  It was a pleasure to meet you.

## 2019-12-15 ENCOUNTER — Emergency Department (HOSPITAL_COMMUNITY): Payer: Self-pay

## 2019-12-15 ENCOUNTER — Inpatient Hospital Stay (HOSPITAL_COMMUNITY)
Admission: EM | Admit: 2019-12-15 | Discharge: 2019-12-22 | DRG: 872 | Disposition: A | Discharge status: Home / Self Care | Payer: Self-pay | Attending: Internal Medicine | Admitting: Internal Medicine

## 2019-12-15 DIAGNOSIS — A419 Sepsis, unspecified organism: Principal | ICD-10-CM | POA: Diagnosis present

## 2019-12-15 DIAGNOSIS — Z8673 Personal history of transient ischemic attack (TIA), and cerebral infarction without residual deficits: Secondary | ICD-10-CM

## 2019-12-15 DIAGNOSIS — Z20822 Contact with and (suspected) exposure to covid-19: Secondary | ICD-10-CM | POA: Diagnosis present

## 2019-12-15 DIAGNOSIS — R7401 Elevation of levels of liver transaminase levels: Secondary | ICD-10-CM | POA: Diagnosis present

## 2019-12-15 DIAGNOSIS — K719 Toxic liver disease, unspecified: Secondary | ICD-10-CM | POA: Diagnosis present

## 2019-12-15 DIAGNOSIS — J40 Bronchitis, not specified as acute or chronic: Secondary | ICD-10-CM | POA: Diagnosis present

## 2019-12-15 DIAGNOSIS — K219 Gastro-esophageal reflux disease without esophagitis: Secondary | ICD-10-CM | POA: Diagnosis present

## 2019-12-15 DIAGNOSIS — F1010 Alcohol abuse, uncomplicated: Secondary | ICD-10-CM | POA: Diagnosis present

## 2019-12-15 DIAGNOSIS — F17290 Nicotine dependence, other tobacco product, uncomplicated: Secondary | ICD-10-CM | POA: Diagnosis present

## 2019-12-15 DIAGNOSIS — R509 Fever, unspecified: Secondary | ICD-10-CM

## 2019-12-15 DIAGNOSIS — K759 Inflammatory liver disease, unspecified: Secondary | ICD-10-CM | POA: Diagnosis present

## 2019-12-15 DIAGNOSIS — R748 Abnormal levels of other serum enzymes: Secondary | ICD-10-CM

## 2019-12-15 DIAGNOSIS — E871 Hypo-osmolality and hyponatremia: Secondary | ICD-10-CM | POA: Diagnosis present

## 2019-12-15 DIAGNOSIS — R634 Abnormal weight loss: Secondary | ICD-10-CM | POA: Diagnosis present

## 2019-12-15 DIAGNOSIS — D649 Anemia, unspecified: Secondary | ICD-10-CM | POA: Diagnosis present

## 2019-12-15 DIAGNOSIS — Z8619 Personal history of other infectious and parasitic diseases: Secondary | ICD-10-CM

## 2019-12-15 DIAGNOSIS — R768 Other specified abnormal immunological findings in serum: Secondary | ICD-10-CM | POA: Diagnosis present

## 2019-12-15 DIAGNOSIS — Z6826 Body mass index (BMI) 26.0-26.9, adult: Secondary | ICD-10-CM

## 2019-12-15 DIAGNOSIS — K760 Fatty (change of) liver, not elsewhere classified: Secondary | ICD-10-CM | POA: Diagnosis present

## 2019-12-15 DIAGNOSIS — R519 Headache, unspecified: Secondary | ICD-10-CM | POA: Diagnosis present

## 2019-12-15 DIAGNOSIS — R4182 Altered mental status, unspecified: Secondary | ICD-10-CM | POA: Diagnosis present

## 2019-12-15 LAB — COMPREHENSIVE METABOLIC PANEL
ALT: 289 U/L — ABNORMAL HIGH (ref 0–44)
AST: 143 U/L — ABNORMAL HIGH (ref 15–41)
Albumin: 2 g/dL — ABNORMAL LOW (ref 3.5–5.0)
Alkaline Phosphatase: 230 U/L — ABNORMAL HIGH (ref 38–126)
Anion gap: 11 (ref 5–15)
BUN: 15 mg/dL (ref 6–20)
CO2: 24 mmol/L (ref 22–32)
Calcium: 8.3 mg/dL — ABNORMAL LOW (ref 8.9–10.3)
Chloride: 97 mmol/L — ABNORMAL LOW (ref 98–111)
Creatinine, Ser: 1.05 mg/dL (ref 0.61–1.24)
GFR calc Af Amer: >60 mL/min (ref >60)
GFR calc non Af Amer: >60 mL/min (ref >60)
Glucose, Bld: 176 mg/dL — ABNORMAL HIGH (ref 70–99)
Potassium: 3.8 mmol/L (ref 3.5–5.1)
Sodium: 132 mmol/L — ABNORMAL LOW (ref 135–145)
Total Bilirubin: 0.7 mg/dL (ref 0.3–1.2)
Total Protein: 5.5 g/dL — ABNORMAL LOW (ref 6.5–8.1)

## 2019-12-15 LAB — CBC WITH DIFFERENTIAL/PLATELET
Abs Immature Granulocytes: 0.06 10*3/uL (ref 0.00–0.07)
Basophils Absolute: 0 10*3/uL (ref 0.0–0.1)
Basophils Relative: 0 %
Eosinophils Absolute: 0 10*3/uL (ref 0.0–0.5)
Eosinophils Relative: 0 %
HCT: 32.3 % — ABNORMAL LOW (ref 39.0–52.0)
Hemoglobin: 9.7 g/dL — ABNORMAL LOW (ref 13.0–17.0)
Immature Granulocytes: 1 %
Lymphocytes Relative: 7 %
Lymphs Abs: 0.7 10*3/uL (ref 0.7–4.0)
MCH: 27.3 pg (ref 26.0–34.0)
MCHC: 30 g/dL (ref 30.0–36.0)
MCV: 91 fL (ref 80.0–100.0)
Monocytes Absolute: 1.3 10*3/uL — ABNORMAL HIGH (ref 0.1–1.0)
Monocytes Relative: 14 %
Neutro Abs: 7.1 10*3/uL (ref 1.7–7.7)
Neutrophils Relative %: 78 %
Platelets: 246 10*3/uL (ref 150–400)
RBC: 3.55 MIL/uL — ABNORMAL LOW (ref 4.22–5.81)
RDW: 14.4 % (ref 11.5–15.5)
WBC: 9.2 10*3/uL (ref 4.0–10.5)
nRBC: 0 % (ref 0.0–0.2)

## 2019-12-15 LAB — URINALYSIS, ROUTINE W REFLEX MICROSCOPIC
Bilirubin Urine: NEGATIVE
Glucose, UA: NEGATIVE mg/dL
Hgb urine dipstick: NEGATIVE
Ketones, ur: NEGATIVE mg/dL
Leukocytes,Ua: NEGATIVE
Nitrite: NEGATIVE
Protein, ur: NEGATIVE mg/dL
Specific Gravity, Urine: 1.012 (ref 1.005–1.030)
pH: 6 (ref 5.0–8.0)

## 2019-12-15 LAB — SARS CORONAVIRUS 2 BY RT PCR (HOSPITAL ORDER, PERFORMED IN ~~LOC~~ HOSPITAL LAB): SARS Coronavirus 2: NEGATIVE

## 2019-12-15 LAB — HEPATITIS PANEL, ACUTE
HCV Ab: REACTIVE — AB
Hep A IgM: NONREACTIVE
Hep B C IgM: NONREACTIVE
Hepatitis B Surface Ag: NONREACTIVE

## 2019-12-15 LAB — RAPID URINE DRUG SCREEN, HOSP PERFORMED
Amphetamines: NOT DETECTED
Barbiturates: NOT DETECTED
Benzodiazepines: NOT DETECTED
Cocaine: NOT DETECTED
Opiates: NOT DETECTED
Tetrahydrocannabinol: NOT DETECTED

## 2019-12-15 LAB — PROTIME-INR
INR: 1.3 — ABNORMAL HIGH (ref 0.8–1.2)
Prothrombin Time: 15.4 seconds — ABNORMAL HIGH (ref 11.4–15.2)

## 2019-12-15 LAB — AMMONIA: Ammonia: 25 umol/L (ref 9–35)

## 2019-12-15 LAB — LACTIC ACID, PLASMA
Lactic Acid, Venous: 1.9 mmol/L (ref 0.5–1.9)
Lactic Acid, Venous: 1.4 mmol/L (ref 0.5–1.9)

## 2019-12-15 LAB — POC OCCULT BLOOD, ED: Fecal Occult Bld: NEGATIVE

## 2019-12-15 LAB — APTT: aPTT: 27 seconds (ref 24–36)

## 2019-12-15 IMAGING — US US ABDOMEN LIMITED
1 series · 14 of 25 positions shown · non-contrast
Comparison: None.

CLINICAL DATA: Transaminitis

EXAM:
ULTRASOUND ABDOMEN LIMITED RIGHT UPPER QUADRANT

[Series 1: us abdomen limited ruq · 14 of 81 slices shown]
[im 1/81]
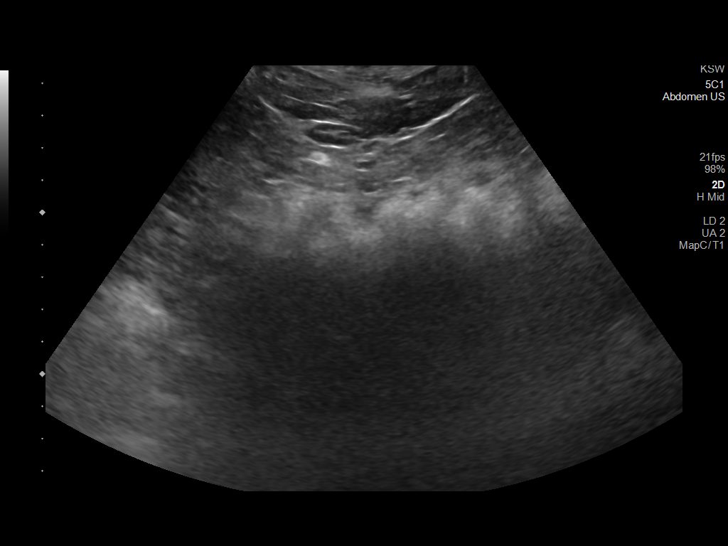
[im 7/81]
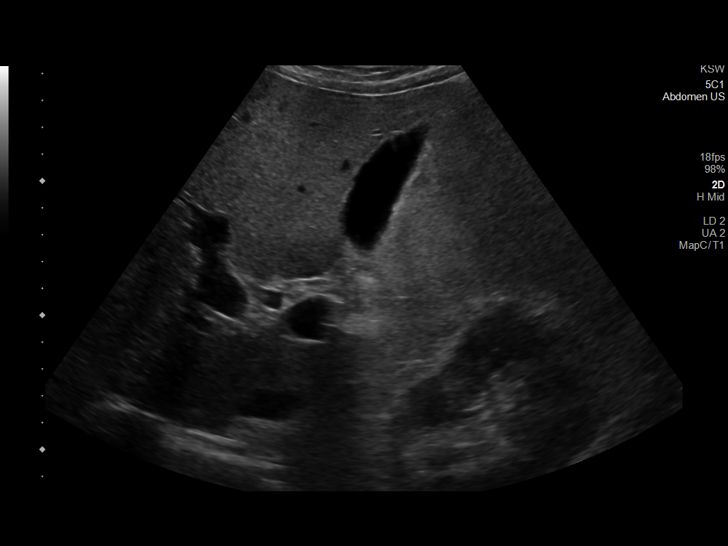
[im 14/81]
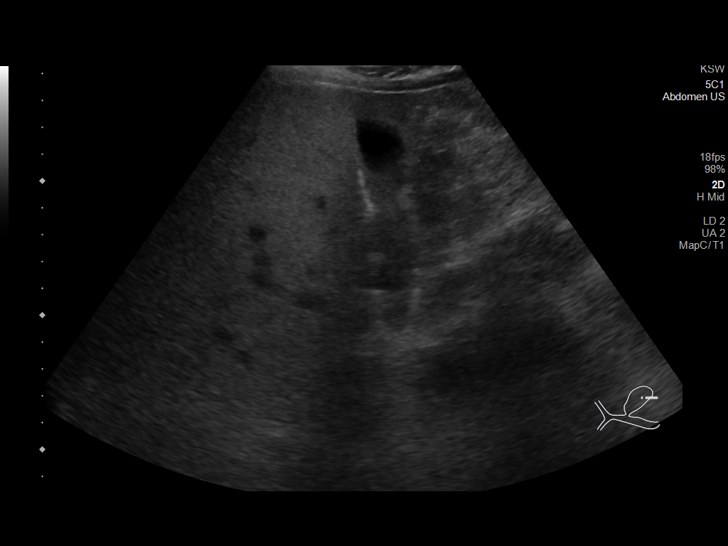
[im 21/81]
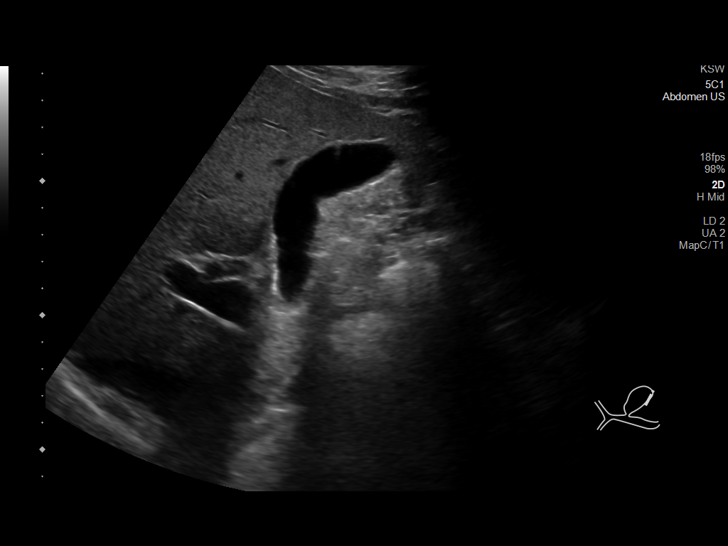
[im 27/81]
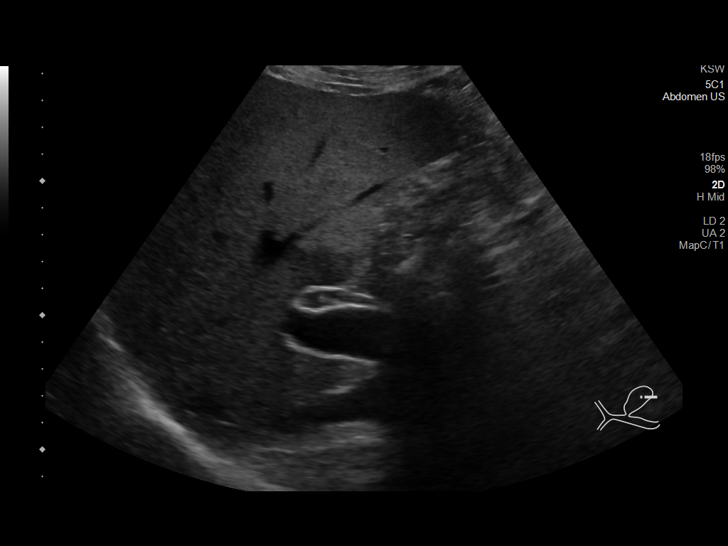
[im 31/81]
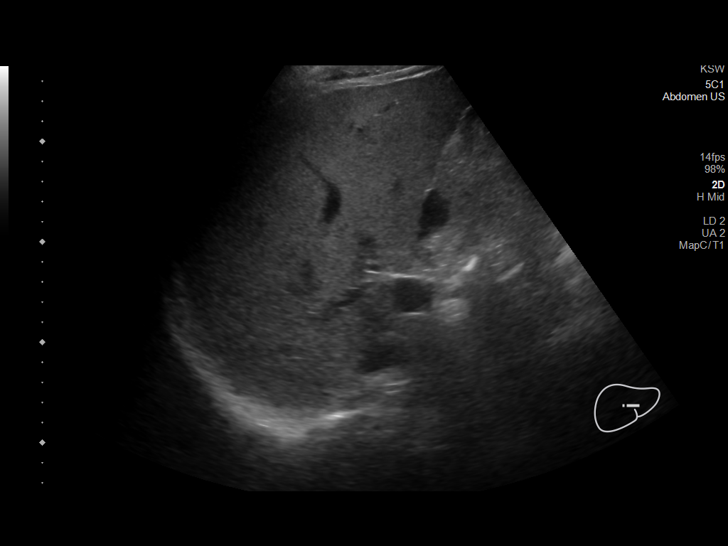
[im 37/81]
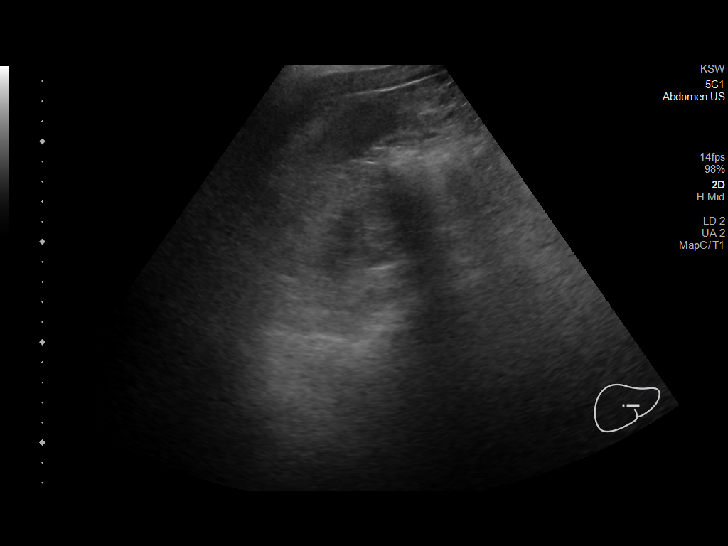
[im 44/81]
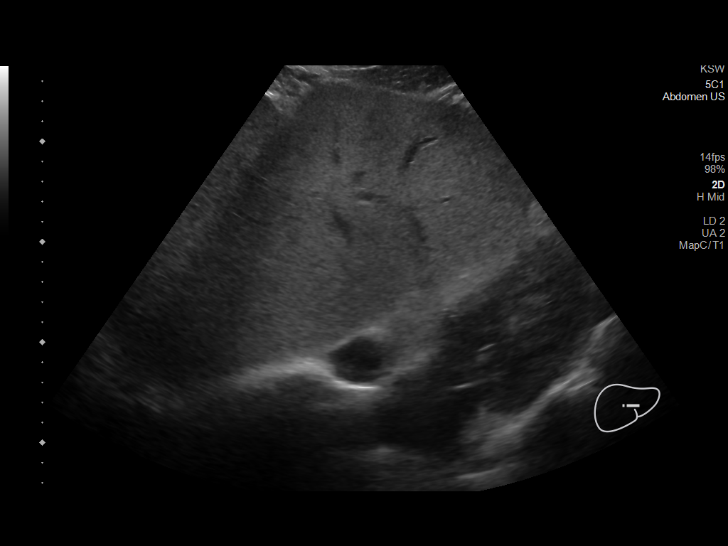
[im 51/81]
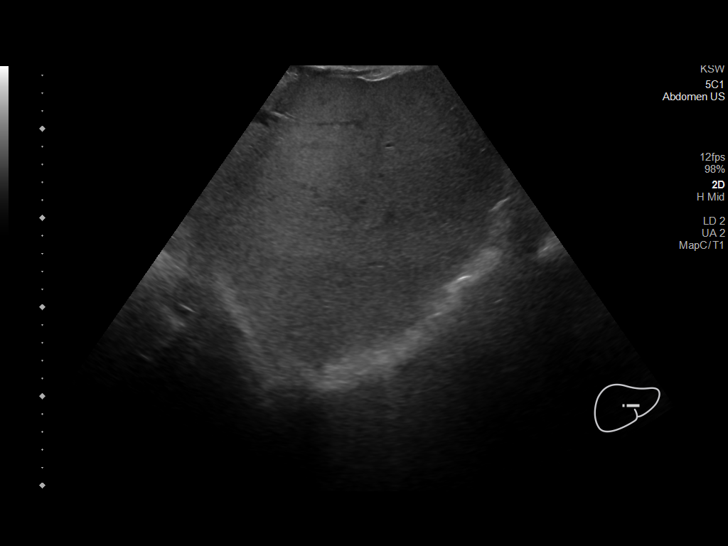
[im 54/81]
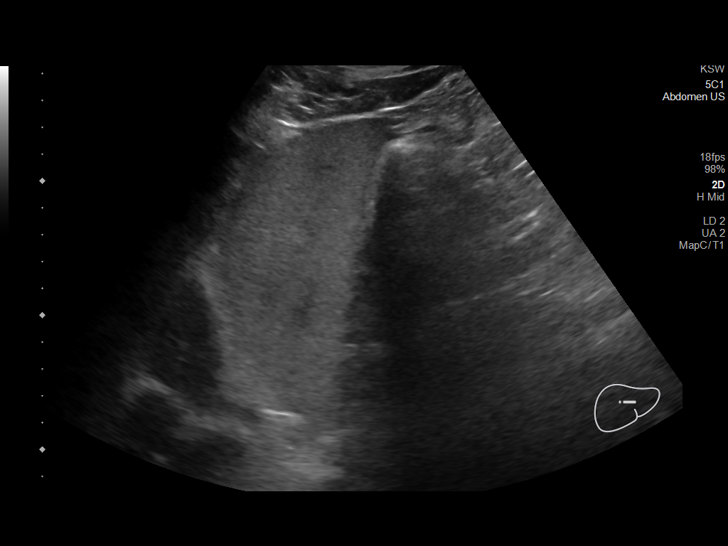
[im 61/81]
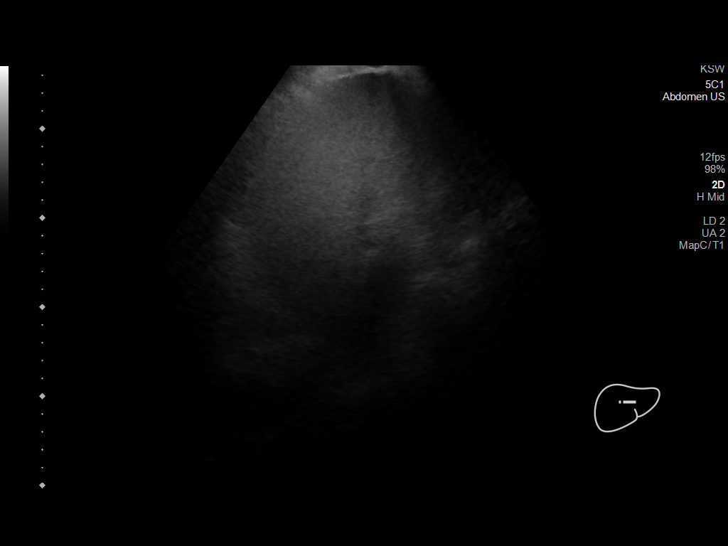
[im 67/81]
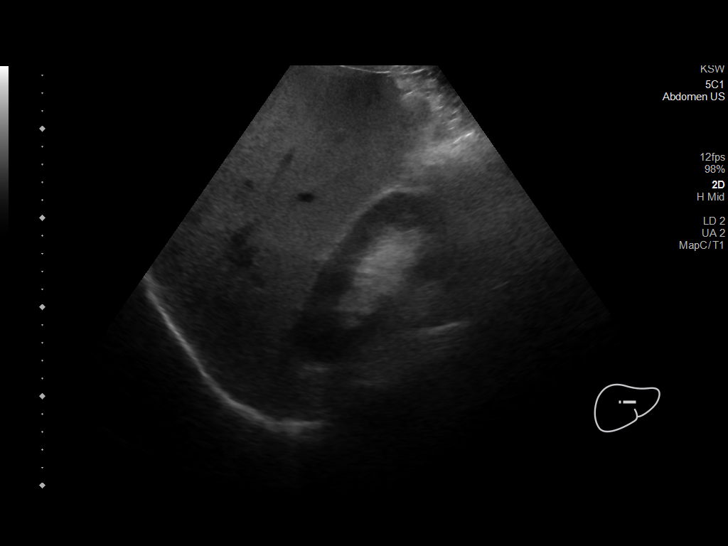
[im 74/81]
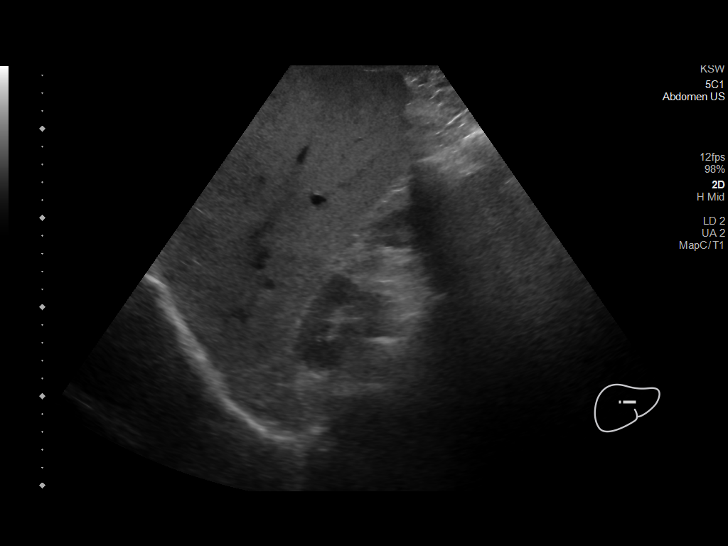
[im 81/81]
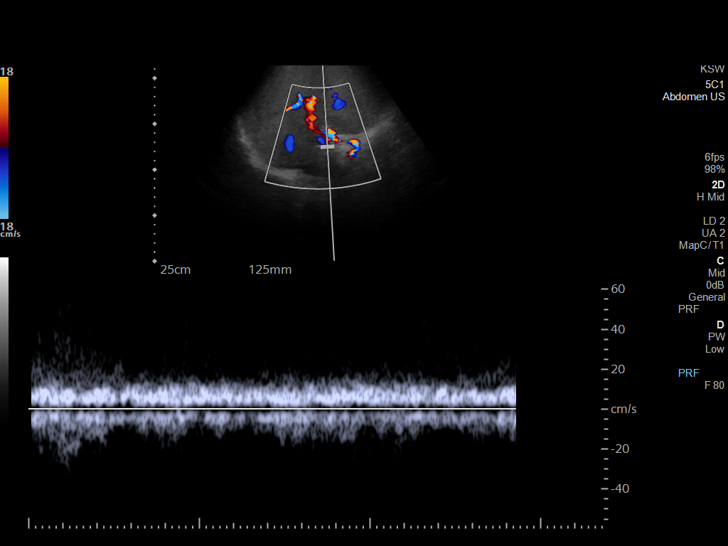

[14 of 25 positions shown; findings below may reference images not displayed]

FINDINGS: Gallbladder:

No gallstones or wall thickening visualized. No sonographic Murphy
sign noted by sonographer.

Common bile duct:

Diameter: 4 mm

Liver:

The liver is enlarged measuring 18.7 cm in the midclavicular line.
There is diffuse increased liver echotexture consistent with hepatic
steatosis. No focal liver abnormality. Portal vein is patent on
color Doppler imaging with normal direction of blood flow towards
the liver.

Other: Right kidney is unremarkable measuring 12.4 cm. No free
fluid.
IMPRESSION: 1. Hepatomegaly, with diffuse hepatic steatosis.
2. Otherwise unremarkable exam.

## 2019-12-15 IMAGING — DX DG CHEST 1V PORT
1 series · 1 of 1 positions shown · non-contrast
Comparison: None.

CLINICAL DATA: Sepsis

EXAM:
PORTABLE CHEST 1 VIEW

[chest ap]
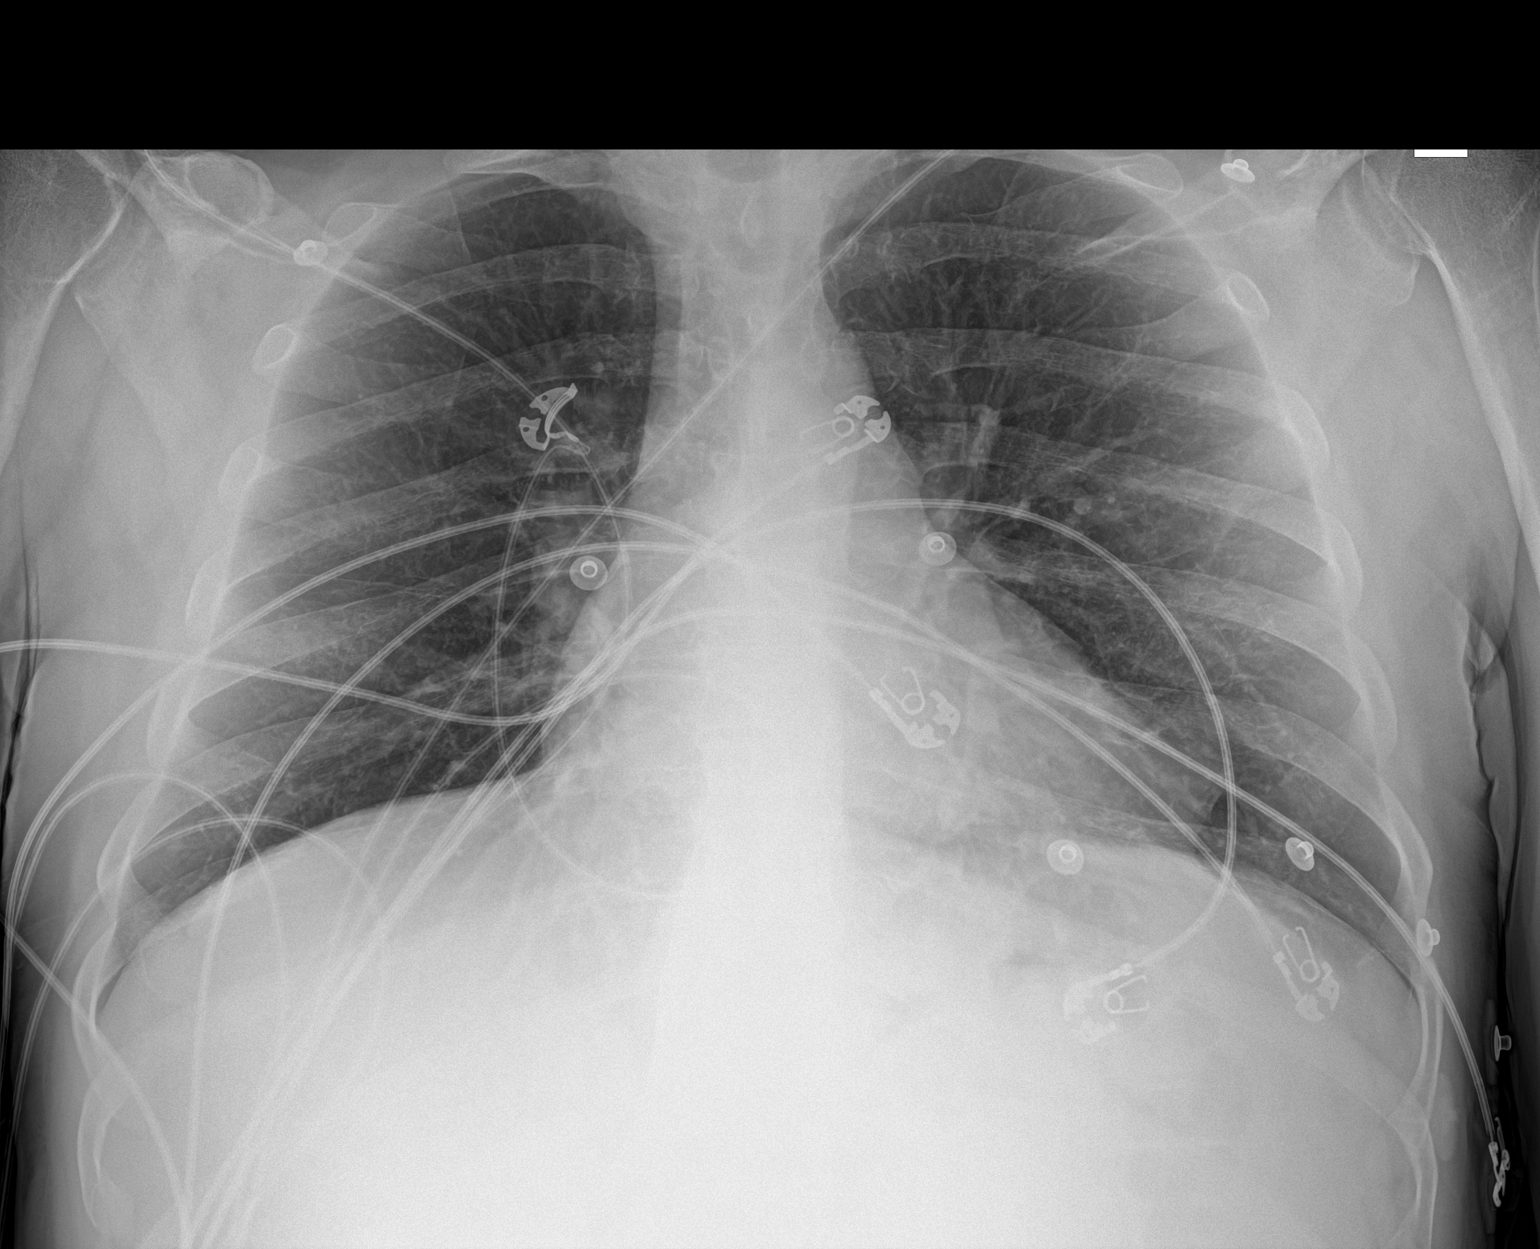

[1 of 1 positions shown; findings below may reference images not displayed]

FINDINGS: The heart size and mediastinal contours are within normal limits. No
focal airspace consolidation, pleural effusion, or pneumothorax. The
visualized skeletal structures are unremarkable.
IMPRESSION: No active disease.

## 2019-12-15 IMAGING — CT CT HEAD W/O CM
4 series · 17 of 47 positions shown, 19 images · non-contrast
Comparison: [DATE] head CT.

CLINICAL DATA: Mental status change

EXAM:
CT HEAD WITHOUT CONTRAST
TECHNIQUE: Contiguous axial images were obtained from the base of the skull
through the vertex without intravenous contrast.

[Series 3: head wo · axial · 0.46mm/px · z∈[+1218,+1333]mm · 7 of 31 slices shown, 9 images]
[im 4/31  brain]
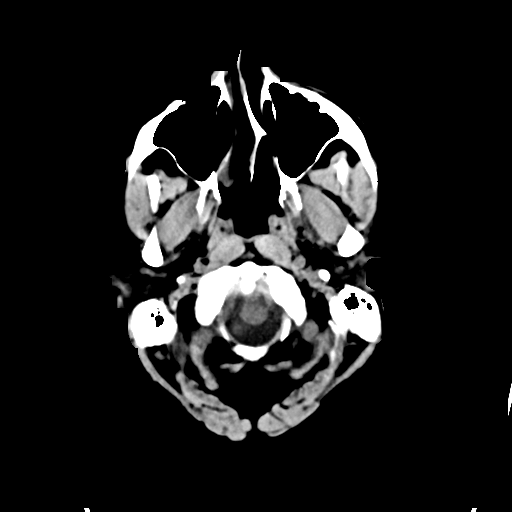
[im 4/31  bone]
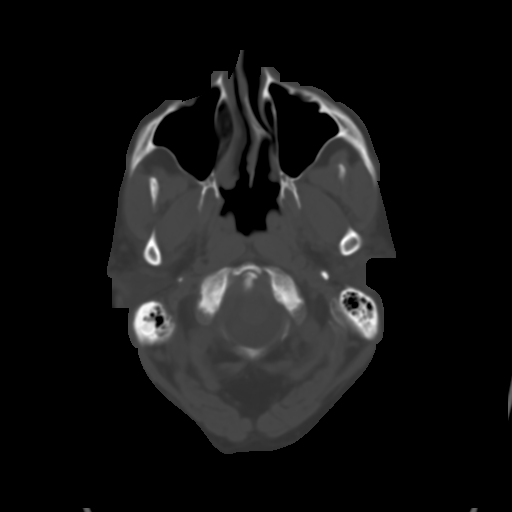
[im 8/31  brain]
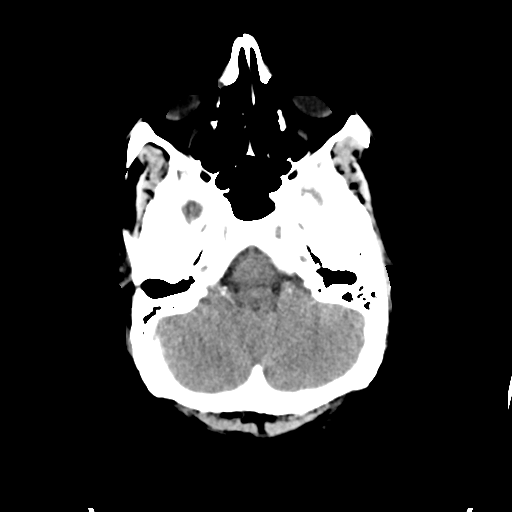
[im 12/31  brain]
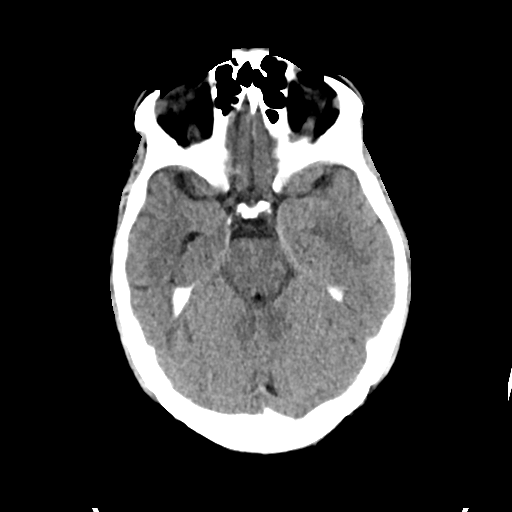
[im 16/31  brain]
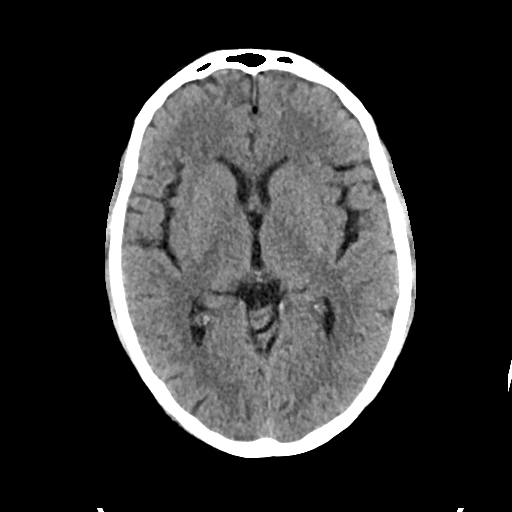
[im 19/31  brain]
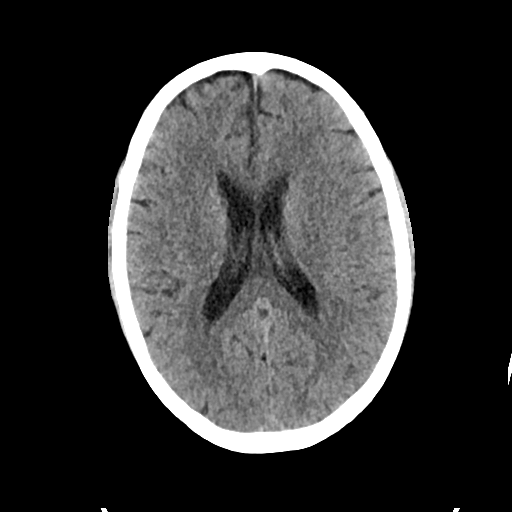
[im 19/31  bone]
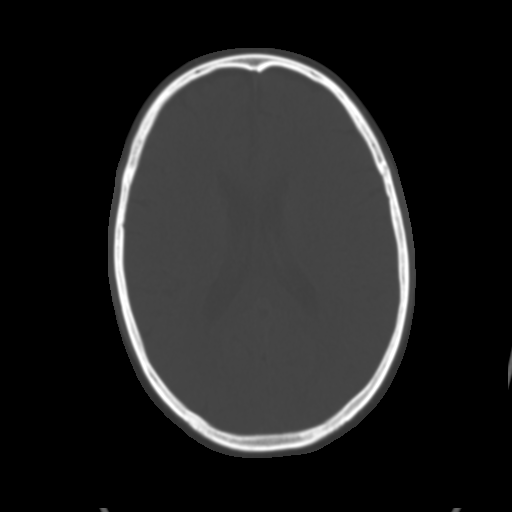
[im 23/31  brain]
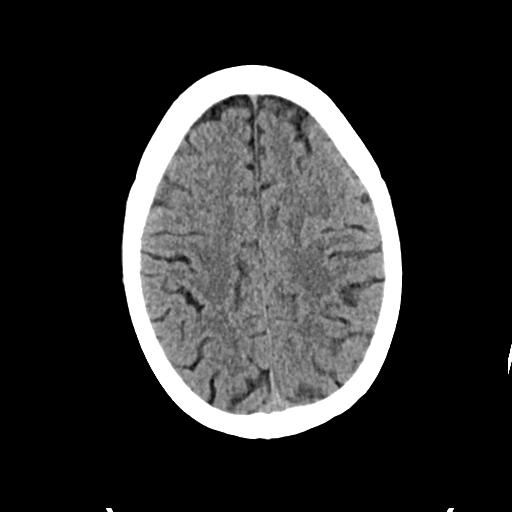
[im 27/31  brain]
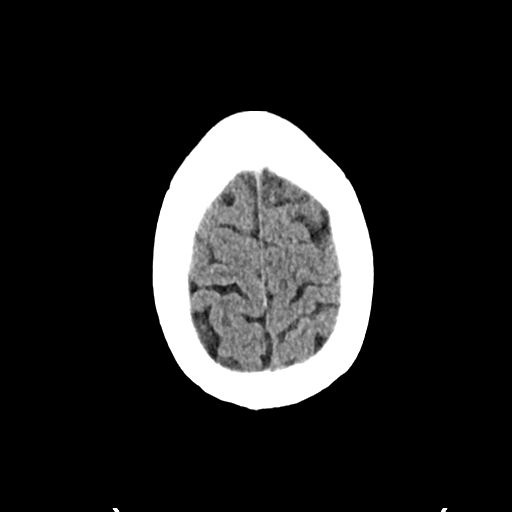

[Series 4: head bone · axial · 0.46mm/px · z∈[+1217,+1271]mm · 4 of 77 slices shown]
[im 8/77  bone]
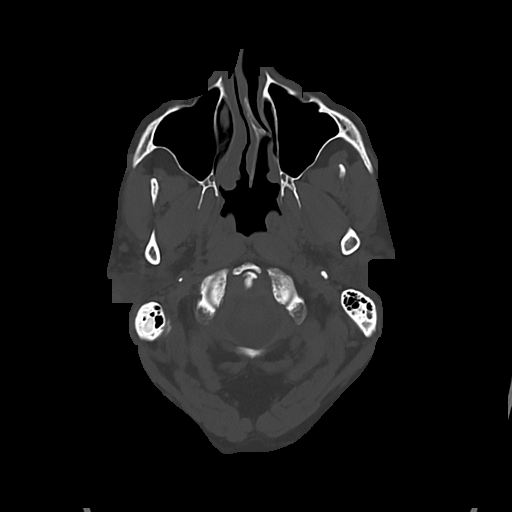
[im 16/77  bone]
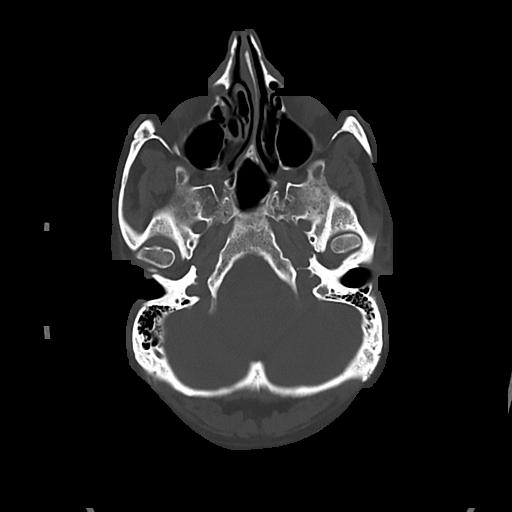
[im 23/77  bone]
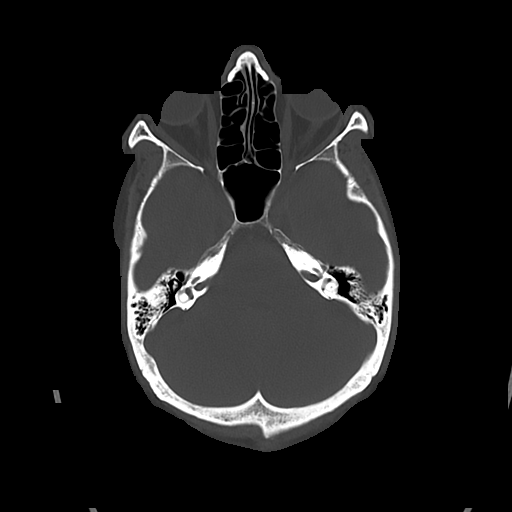
[im 35/77  bone]
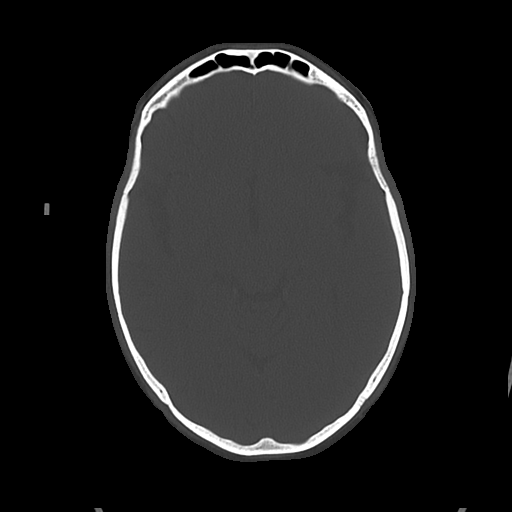

[Series 5: cor soft · coronal · 0.33mm/px · 3 of 72 slices shown]
[im 24/72  brain]
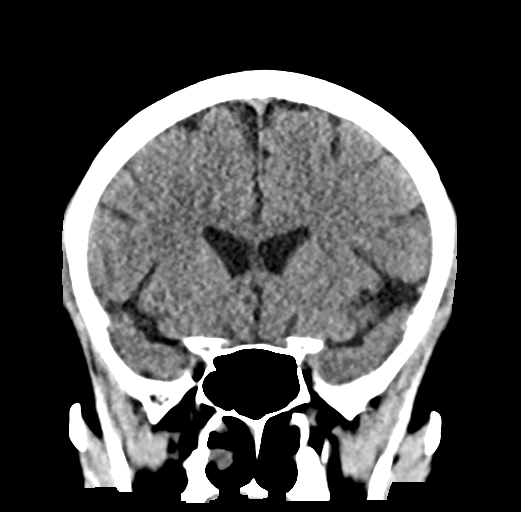
[im 32/72  brain]
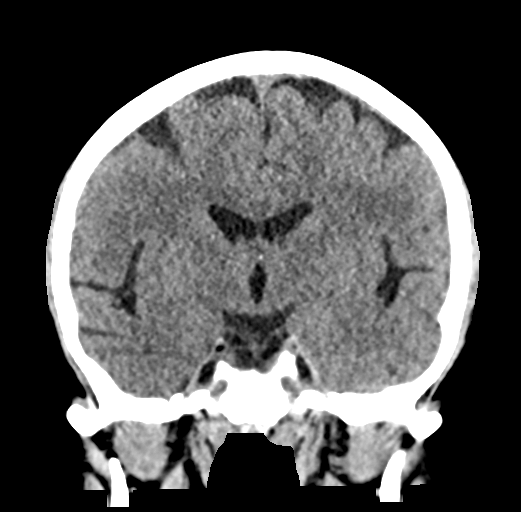
[im 40/72  brain]
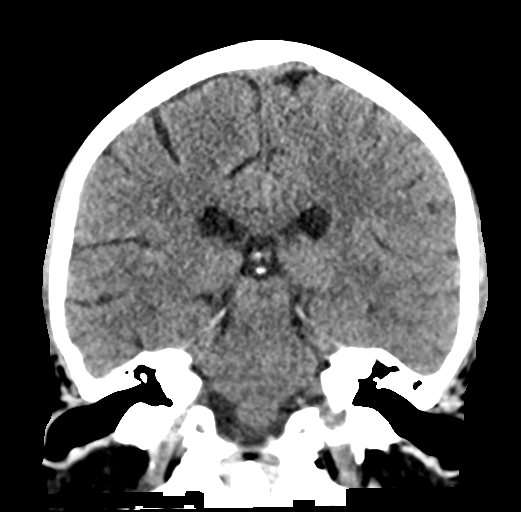

[Series 6: sag soft · sagittal · 0.34mm/px · 3 of 57 slices shown]
[im 19/57  brain]
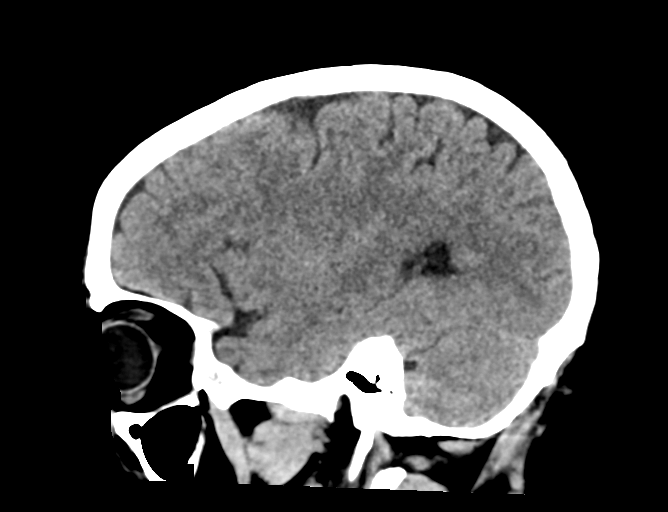
[im 29/57  brain]
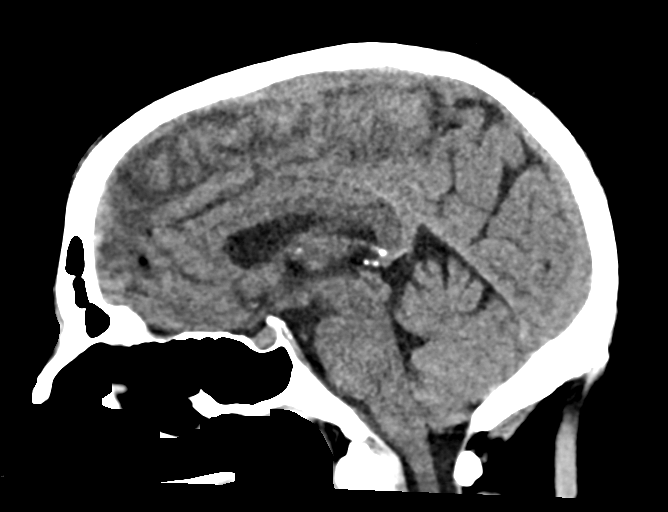
[im 38/57  brain]
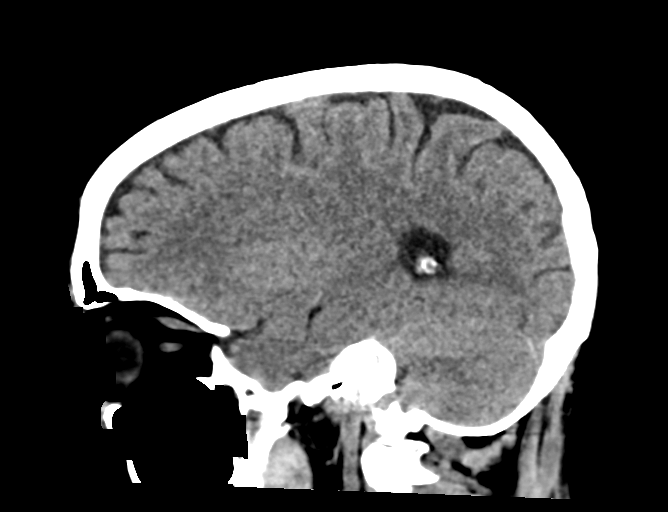

[17 of 47 positions shown; findings below may reference images not displayed]

FINDINGS: Brain: No acute infarct or intracranial hemorrhage. No mass lesion.
No midline shift, ventriculomegaly or extra-axial fluid collection.

Vascular: No hyperdense vessel or unexpected calcification.

Skull: Negative for fracture or focal lesion.

Sinuses/Orbits: Normal orbits. Clear paranasal sinuses. No mastoid
effusion.

Other: None.
IMPRESSION: No acute intracranial process.

## 2019-12-15 MED ORDER — LACTATED RINGERS IV BOLUS (SEPSIS)
1000.0000 mL | Freq: Once | INTRAVENOUS | Status: AC
  Administered 2019-12-15: 1000 mL via INTRAVENOUS

## 2019-12-15 MED ORDER — VANCOMYCIN HCL 1250 MG/250ML IV SOLN
1250.0000 mg | Freq: Two times a day (BID) | INTRAVENOUS | Status: DC
  Administered 2019-12-16 – 2019-12-17 (×3): 1250 mg via INTRAVENOUS
  Filled 2019-12-15 (×6): qty 250

## 2019-12-15 MED ORDER — LACTATED RINGERS IV BOLUS (SEPSIS): 1000.0000 mL | Freq: Once | INTRAVENOUS | Status: DC

## 2019-12-15 MED ORDER — METRONIDAZOLE IN NACL 5-0.79 MG/ML-% IV SOLN
500.0000 mg | Freq: Three times a day (TID) | INTRAVENOUS | Status: DC
  Administered 2019-12-15 – 2019-12-17 (×5): 500 mg via INTRAVENOUS
  Filled 2019-12-15 (×5): qty 100

## 2019-12-15 MED ORDER — MIDAZOLAM HCL 2 MG/2ML IJ SOLN
2.0000 mg | Freq: Once | INTRAMUSCULAR | Status: AC
  Administered 2019-12-15: 2 mg via INTRAVENOUS
  Filled 2019-12-15: qty 2

## 2019-12-15 MED ORDER — VANCOMYCIN HCL 2000 MG/400ML IV SOLN
2000.0000 mg | Freq: Once | INTRAVENOUS | Status: AC
  Administered 2019-12-15: 2000 mg via INTRAVENOUS
  Filled 2019-12-15: qty 400

## 2019-12-15 MED ORDER — VANCOMYCIN HCL IN DEXTROSE 1-5 GM/200ML-% IV SOLN
1000.0000 mg | Freq: Once | INTRAVENOUS | Status: DC
  Filled 2019-12-15: qty 200

## 2019-12-15 MED ORDER — SODIUM CHLORIDE 0.9 % IV SOLN
2.0000 g | Freq: Three times a day (TID) | INTRAVENOUS | Status: DC
  Administered 2019-12-15 – 2019-12-17 (×5): 2 g via INTRAVENOUS
  Filled 2019-12-15 (×5): qty 2

## 2019-12-15 MED ORDER — METRONIDAZOLE IN NACL 5-0.79 MG/ML-% IV SOLN
500.0000 mg | Freq: Once | INTRAVENOUS | Status: AC
  Administered 2019-12-15: 500 mg via INTRAVENOUS
  Filled 2019-12-15: qty 100

## 2019-12-15 MED ORDER — ONDANSETRON HCL 4 MG/2ML IJ SOLN: 4.0000 mg | Freq: Four times a day (QID) | INTRAMUSCULAR | Status: DC | PRN

## 2019-12-15 MED ORDER — SODIUM CHLORIDE 0.9 % IV SOLN
2.0000 g | Freq: Once | INTRAVENOUS | Status: AC
  Administered 2019-12-15: 2 g via INTRAVENOUS
  Filled 2019-12-15: qty 2

## 2019-12-15 MED ORDER — VANCOMYCIN HCL IN DEXTROSE 1-5 GM/200ML-% IV SOLN
1000.0000 mg | Freq: Once | INTRAVENOUS | Status: AC
  Administered 2019-12-16: 1000 mg via INTRAVENOUS
  Filled 2019-12-15: qty 200

## 2019-12-15 MED ORDER — ALBUTEROL SULFATE HFA 108 (90 BASE) MCG/ACT IN AERS
2.0000 | INHALATION_SPRAY | Freq: Four times a day (QID) | RESPIRATORY_TRACT | Status: DC | PRN
  Filled 2019-12-15: qty 6.7

## 2019-12-15 MED ORDER — SODIUM CHLORIDE 0.9 % IV SOLN: 2.0000 g | Freq: Once | INTRAVENOUS | Status: DC

## 2019-12-15 MED ORDER — BENZONATATE 100 MG PO CAPS
100.0000 mg | ORAL_CAPSULE | Freq: Three times a day (TID) | ORAL | Status: DC | PRN
  Administered 2019-12-17: 200 mg via ORAL
  Administered 2019-12-20 (×2): 100 mg via ORAL
  Filled 2019-12-15: qty 2
  Filled 2019-12-15 (×3): qty 1

## 2019-12-15 MED ORDER — LACTATED RINGERS IV SOLN: INTRAVENOUS | Status: AC

## 2019-12-15 MED ORDER — DEXTROSE-NACL 5-0.9 % IV SOLN: INTRAVENOUS | Status: DC

## 2019-12-15 MED ORDER — LIDOCAINE HCL 2 % IJ SOLN
5.0000 mL | Freq: Once | INTRAMUSCULAR | Status: AC
  Administered 2019-12-15: 100 mg via INTRADERMAL
  Filled 2019-12-15: qty 20

## 2019-12-15 MED ORDER — ONDANSETRON HCL 4 MG PO TABS: 4.0000 mg | ORAL_TABLET | Freq: Four times a day (QID) | ORAL | Status: DC | PRN

## 2019-12-15 NOTE — ED Triage Notes (Signed)
BIB by [facility] w/ presentation of AMS of possible infection. EMS states pt had a respiratory infection 3 weeks ago and finished course of antibiotics with no issue. Wife noticed yesterday around 1900 hrs that pt seems to become more altered in mentation. EMS reported a normal stroke screen but the pt has no distinction between R and L sides of body. AOx4. EMS reported fever of 102.5 BP 110/76 and HR 105-110. NAD at this time.

## 2019-12-15 NOTE — ED Notes (Signed)
Pt taken to CT.

## 2019-12-15 NOTE — ED Notes (Signed)
RN to room, pt is currently sleeping ,VSS at this time , pt appears to be in NAD at this moment, RN will continue to monitor

## 2019-12-15 NOTE — ED Provider Notes (Signed)
[facility] EMERGENCY DEPARTMENT Provider Note   CSN: [id number] Arrival date & time: 12/15/19  1248     History Chief Complaint  Patient presents with  . Altered Mental Status    Brian Ochoa is a 59 y.o. male with PMH of TIA presents to the ED via EMS for altered mental status.  History was obtained by EMS and they report that his wife noticed that at approximately 1900 hrs yesterday patient became more altered in his mentation.  On their examination, patient was unable to distinguish between right side and left side of body per EMS.  They also noted him to be febrile to 102.5 F and tachycardic.  He was given Tylenol in route to the hospital.  On my examination, patient is having difficulty remembering medications recently prescribed.  I asked him what year it was and he was having difficulty recalling that it was the year 2021.  He is able to exhibit full range of motion and strength of his extremities with strength intact against resistance.  He can follow commands.  He just feels "off".  He states that he does not feel "here right now".  He denies any illicit drug use or alcohol use.  He states that he developed a cough a few weeks ago and since then has been prescribed numerous medications which has not helped.  Instead, he feels as though he is getting worse.  He endorses intermittent headaches and dizziness.  I called and spoke with his wife, Brian Ochoa.  She reports that all began approximately 3 weeks ago when he was diagnosed with bronchitis versus pneumonia and treated with prednisone and antibiotics.  Since then, he has endorsed intermittent episodes of headache from all of his incessant coughing.  They prescribed Tessalon Perles in an effort to mitigate his cough and headache symptoms.  She states that he has been doing his best to stay hydrated, drinking Gatorade regularly.  However, he became progressively more altered and last evening around 7 PM he laid down for  bed much earlier than usual.  Upon waking up this morning, patient was much more altered.  He was unable to make coffee and was unaware of the current year.  This prompted her to call EMS.    HPI     Past Medical History:  Diagnosis Date  . TIA (transient ischemic attack)     There are no problems to display for this patient.   No past surgical history on file.     No family history on file.  Social History   Tobacco Use  . Smoking status: Not on file  Substance Use Topics  . Alcohol use: Not on file  . Drug use: Not on file    Home Medications Prior to Admission medications   Medication Sig Start Date End Date Taking? Authorizing Provider  albuterol (VENTOLIN HFA) 108 (90 Base) MCG/ACT inhaler Inhale 2 puffs into the lungs every 6 (six) hours as needed for wheezing or shortness of breath.   Yes [provider]  benzonatate (TESSALON) 100 MG capsule Take 100-200 mg by mouth 3 (three) times daily as needed for cough.   Yes [provider]    Allergies    Patient has no known allergies.  Review of Systems   Review of Systems  Unable to perform ROS: Mental status change    Physical Exam Updated Vital Signs BP 113/65   Pulse 88   Temp 98.5 F (36.9 C) (Oral)   Resp Marland Kitchen)  24   Wt 91.6 kg   SpO2 94%   BMI 28.17 kg/m   Physical Exam Vitals and nursing note reviewed. Exam conducted with a chaperone present.  Constitutional:      General: He is not in acute distress.    Appearance: He is ill-appearing.  HENT:     Head: Normocephalic and atraumatic.  Eyes:     General: No scleral icterus.    Conjunctiva/sclera: Conjunctivae normal.  Neck:     Comments: No meningismus. Cardiovascular:     Rate and Rhythm: Regular rhythm. Tachycardia present.     Pulses: Normal pulses.     Heart sounds: Normal heart sounds.  Pulmonary:     Effort: Pulmonary effort is normal. No respiratory distress.     Breath sounds: Normal breath sounds. No stridor. No  wheezing, rhonchi or rales.  Musculoskeletal:     Cervical back: Normal range of motion and neck supple. No rigidity.  Skin:    General: Skin is dry.  Neurological:     Mental Status: He is alert.     GCS: GCS eye subscore is 4. GCS verbal subscore is 5. GCS motor subscore is 6.  Psychiatric:        Mood and Affect: Mood normal.        Behavior: Behavior normal.        Thought Content: Thought content normal.     ED Results / Procedures / Treatments   Labs (all labs ordered are listed, but only abnormal results are displayed) Labs Reviewed  COMPREHENSIVE METABOLIC PANEL - Abnormal; Notable for the following components:      Result Value   Sodium 132 (*)    Chloride 97 (*)    Glucose, Bld 176 (*)    Calcium 8.3 (*)    Total Protein 5.5 (*)    Albumin 2.0 (*)    AST 143 (*)    ALT 289 (*)    Alkaline Phosphatase 230 (*)    All other components within normal limits  CBC WITH DIFFERENTIAL/PLATELET - Abnormal; Notable for the following components:   RBC 3.55 (*)    Hemoglobin 9.7 (*)    HCT 32.3 (*)    Monocytes Absolute 1.3 (*)    All other components within normal limits  PROTIME-INR - Abnormal; Notable for the following components:   Prothrombin Time 15.4 (*)    INR 1.3 (*)    All other components within normal limits  SARS CORONAVIRUS 2 BY RT PCR (HOSPITAL ORDER, [facility])  CULTURE, BLOOD (ROUTINE X 2)  CULTURE, BLOOD (ROUTINE X 2)  URINE CULTURE  CSF CULTURE  LACTIC ACID, PLASMA  APTT  LACTIC ACID, PLASMA  URINALYSIS, ROUTINE W REFLEX MICROSCOPIC  RAPID URINE DRUG SCREEN, HOSP PERFORMED  TSH  CSF CELL COUNT WITH DIFFERENTIAL  PROTEIN AND GLUCOSE, CSF  AMMONIA  HEPATITIS PANEL, ACUTE    EKG EKG Interpretation  Date/Time:  Wednesday December 15 2019 13:37:36 EDT Ventricular Rate:  94 PR Interval:    QRS Duration: 91 QT Interval:  342 QTC Calculation: 428 R Axis:   72 Text Interpretation: Sinus rhythm No STEMI Confirmed by  Octaviano Glow [phone]) on 12/15/2019 1:39:08 PM   Radiology CT Head Wo Contrast  Result Date: 12/15/2019 CLINICAL DATA:  Mental status change EXAM: CT HEAD WITHOUT CONTRAST TECHNIQUE: Contiguous axial images were obtained from the base of the skull through the vertex without intravenous contrast. COMPARISON:  09/09/2019 head CT. FINDINGS: Brain: No acute  infarct or intracranial hemorrhage. No mass lesion. No midline shift, ventriculomegaly or extra-axial fluid collection. Vascular: No hyperdense vessel or unexpected calcification. Skull: Negative for fracture or focal lesion. Sinuses/Orbits: Normal orbits. Clear paranasal sinuses. No mastoid effusion. Other: None. IMPRESSION: No acute intracranial process. Electronically Signed   By: Primitivo Gauze M.D.   On: 12/15/2019 14:21   DG Chest Port 1 View  Result Date: 12/15/2019 CLINICAL DATA:  Sepsis EXAM: PORTABLE CHEST 1 VIEW COMPARISON:  None. FINDINGS: The heart size and mediastinal contours are within normal limits. No focal airspace consolidation, pleural effusion, or pneumothorax. The visualized skeletal structures are unremarkable. IMPRESSION: No active disease. Electronically Signed   By: Davina Poke D.O.   On: 12/15/2019 14:03    Procedures Procedures (including critical care time)  Medications Ordered in ED Medications  lactated ringers infusion (has no administration in time range)  lactated ringers bolus 1,000 mL (1,000 mLs Intravenous New Bag/Given 12/15/19 1337)    And  lactated ringers bolus 1,000 mL (has no administration in time range)    And  lactated ringers bolus 1,000 mL (1,000 mLs Intravenous New Bag/Given 12/15/19 1431)  vancomycin (VANCOREADY) IVPB 2000 mg/400 mL (2,000 mg Intravenous New Bag/Given 12/15/19 1435)  lidocaine (XYLOCAINE) 2 % (with pres) injection 100 mg (has no administration in time range)  ceFEPIme (MAXIPIME) 2 g in sodium chloride 0.9 % 100 mL IVPB (has no administration in time range)    vancomycin (VANCOREADY) IVPB 1250 mg/250 mL (has no administration in time range)  ceFEPIme (MAXIPIME) 2 g in sodium chloride 0.9 % 100 mL IVPB (2 g Intravenous New Bag/Given 12/15/19 1354)  metroNIDAZOLE (FLAGYL) IVPB 500 mg (500 mg Intravenous New Bag/Given 12/15/19 1352)  midazolam (VERSED) injection 2 mg (2 mg Intravenous Given 12/15/19 1520)    ED Course  I have reviewed the triage vital signs and the nursing notes.  Pertinent labs & imaging results that were available during my care of the patient were reviewed by me and considered in my medical decision making (see chart for details).  Clinical Course as of Dec 15 1606  Wed Dec 15, 2019  1544 We attempted LP, but patient was not the most cooperative with procedure given his AMS.  Unsuccessful.  Patient will need to be admitted to hospitalist for his AMS with unclear etiology.  Ammonia still pending.   [GG]    Clinical Course User Index [GG] Corena Herter, PA-C   MDM Rules/Calculators/A&P                          Given fever of 102.5 F, tachycardia, mild tachypnea, and relatively soft blood pressure at 94/64, ED sepsis order set was initiated.  Patient had received 500 cc NS in route via EMS, will provide an additional 2 L IV LR to complete 30 cc/kg.  We will also obtain head CT given his AMS of unclear etiology and UDS.  Patient had been vaccinated for COVID-19 and tested negative x2, but given his exposures and cough symptoms, will repeat COVID-19 testing.    Labs CBC with differential: No leukocytosis.  New anemia with hemoglobin of 9.7, down from 12.9 and labs obtained 3 months ago. Blood cultures: Pending. Lactic acid: 1.9 >>  UA: Urine culture: Pending. UDS: CMP: New transaminitis with AST of 143 and ALT of 289, increased from normal limits in labs obtained 3 months ago.  Albumin is also significantly lower at 2.0 along with his protein at  5.5.  Elevated alkaline phosphatase to 230.  We will add an ammonia. COVID-19:  Negative. Ammonia: Pending.  Imaging I personally reviewed plain films obtained of chest which demonstrate no acute cardiopulmonary disease. CT head without contrast was personally reviewed and demonstrates no acute intracranial process.    EKG is reviewed and demonstrates normal sinus rhythm.  We attempted LP, but patient was not the most cooperative with procedure given his AMS.  Unsuccessful.  Patient will need to be admitted to hospitalist for his AMS with unclear etiology.  Ammonia still pending.  Will also obtain hepatitis panel.    At shift change care was transferred to Simonne Martinet, PA-C who will follow pending studies, re-evaluate, and determine disposition.  Plan is for admission for undifferentiated AMS.  She paged IR about possible LP prior to hospitalist admission.     Final Clinical Impression(s) / ED Diagnoses Final diagnoses:  Altered mental status, unspecified altered mental status type  Transaminitis    Rx / DC Orders ED Discharge Orders    None       Corena Herter, PA-C 12/15/19 1608    Wyvonnia Dusky, MD 12/15/19 1757

## 2019-12-15 NOTE — ED Notes (Signed)
Lumbar puncture attempted by MD. Failed. No sample collected.

## 2019-12-15 NOTE — Progress Notes (Signed)
Pharmacy Antibiotic Note  Brian Ochoa is a 59 y.o. male admitted on 12/15/2019 with sepsis.  Pharmacy has been consulted for vancomycin and cefepime dosing.  Patient presents with AMS. Recent hx of respiratory infection s/p abx and prednisone. Tmax 102.5, WBC 9.2, Lactic acid is 1.9. HR 100s, SBP 100s. Renal function at baseline with SCr 1.05/CrCl ~90.   Estimated AUC 519.  Plan: Vancomycin 2,000mg  IV once Vancomycin 1,250 IV every 12 hours.  Goal trough 15-20 mcg/mL.  Cefepime 2g IV every 8 hours  Follow up with cultures, antibiotic de-escalation and LOT Monitor renal function and clinical progress  Temp (24hrs), Avg:98.5 F (36.9 C), Min:98.5 F (36.9 C), Max:98.5 F (36.9 C)  No results for input(s): WBC, CREATININE, LATICACIDVEN, VANCOTROUGH, VANCOPEAK, VANCORANDOM, GENTTROUGH, GENTPEAK, GENTRANDOM, TOBRATROUGH, TOBRAPEAK, TOBRARND, AMIKACINPEAK, AMIKACINTROU, AMIKACIN in the last 168 hours.  CrCl cannot be calculated (Patient's most recent lab result is older than the maximum 21 days allowed.).    Not on File  Antimicrobials this admission: Vancomycin 9/22 >>  Cefepime 9/22 >>  Metronidazole 9/22 x1  Dose adjustments this admission: N/a  Microbiology results: 9/22 BCx: pending 9/22 UCx: pending  Thank you for allowing pharmacy to be a part of this patients care.  Mercy Riding, PharmD PGY1 Acute Care Pharmacy Resident Please refer to [facility] for unit-specific pharmacist

## 2019-12-15 NOTE — H&P (Signed)
History and Physical   Brian Ochoa JEH:[id number] DOB: [birth year] DOA: 12/15/2019  Referring MD/NP/PA: Ileene Musa, PA  PCP: Aletha Halim., PA-C   Outpatient Specialists: None  Patient coming from: Home  Chief Complaint: Altered mental status  HPI: Brian Ochoa is a 59 y.o. male with medical history significant of prior history of TIA who presented to the ER after the wife noted him to be altered and febrile.  He was also notably tachycardic.  Patient was last known normal few hours earlier.  When EMS arrived they noted patient's symptoms and started on the sepsis protocol.  In the ER also a code sepsis was initiated and patient evaluated.  No obvious source of his sepsis noted.  All work-up done so far proven negative except positive for hepatitis C and transaminitis.  Number puncture was attempted in the ER but failed.  IR to try again possibly tomorrow.  Patient is now more awake and alert and almost back to his baseline.  Drug screen was also negative.  At this point patient suspected to have acute hepatitis C.  As to the cause of his sepsis still unclear could be acute viral illness.  No other source of bacterial infection including UTI or pneumonia.  GI was consulted due to transaminitis and the reactive hepatitis C at this point..  ED Course: Temperature was 102 blood pressure 113/65 pulse 105 respirate 28 oxygen sats 94% room air.  White count 9.2 hemoglobin 9.7 platelet 246.  Sodium 132 potassium 3.8 chloride 97 CO2 24 BUN 15 creatinine 1.05 and calcium 8.3 INR 1.3 and glucose 176.  Urinalysis and urine drug screen were both negative.  Head CT without contrast negative ultrasound of the right upper quadrant showed no evidence of gallstones or obstructive uropathy.  Ammonia level 25 and lactic acid 1.4.  Acute hepatitis panel is only reactive for hepatitis C antibody.  Patient therefore suspected to have hepatitis C infection.  Not sure if this is acute or chronic.  Review of  Systems: As per HPI otherwise 10 point review of systems negative.    Past Medical History:  Diagnosis Date  . TIA (transient ischemic attack)     No past surgical history on file.   has no history on file for tobacco use, alcohol use, and drug use.  No Known Allergies  No family history on file.   Prior to Admission medications   Medication Sig Start Date End Date Taking? Authorizing Provider  albuterol (VENTOLIN HFA) 108 (90 Base) MCG/ACT inhaler Inhale 2 puffs into the lungs every 6 (six) hours as needed for wheezing or shortness of breath.   Yes [provider]  benzonatate (TESSALON) 100 MG capsule Take 100-200 mg by mouth 3 (three) times daily as needed for cough.   Yes [provider]    Physical Exam: Vitals:   12/15/19 1830 12/15/19 1900 12/15/19 1915 12/15/19 1930  BP: 111/75 105/71 107/77 110/87  Pulse: 92     Resp:      Temp:      TempSrc:      SpO2: 97%     Weight:      Height:          Constitutional: More awake and alert no distress Vitals:   12/15/19 1830 12/15/19 1900 12/15/19 1915 12/15/19 1930  BP: 111/75 105/71 107/77 110/87  Pulse: 92     Resp:      Temp:      TempSrc:      SpO2:  97%     Weight:      Height:       Eyes: PERRL, lids and conjunctivae normal ENMT: Mucous membranes are dry. Posterior pharynx clear of any exudate or lesions.Normal dentition.  Neck: normal, supple, no masses, no thyromegaly Respiratory: clear to auscultation bilaterally, no wheezing, no crackles. Normal respiratory effort. No accessory muscle use.  Cardiovascular: Regular rate and rhythm, no murmurs / rubs / gallops. No extremity edema. 2+ pedal pulses. No carotid bruits.  Abdomen: no tenderness, no masses palpated. No hepatosplenomegaly. Bowel sounds positive.  Musculoskeletal: no clubbing / cyanosis. No joint deformity upper and lower extremities. Good ROM, no contractures. Normal muscle tone.  Skin: no rashes, lesions, ulcers. No  induration Neurologic: CN 2-12 grossly intact. Sensation intact, DTR normal. Strength 5/5 in all 4.  Psychiatric: Normal judgment and insight. Alert and oriented x 3. Normal mood.     Labs on Admission: I have personally reviewed following labs and imaging studies  CBC: Recent Labs  Lab 12/15/19 1332  WBC 9.2  NEUTROABS 7.1  HGB 9.7*  HCT 32.3*  MCV 91.0  PLT 161   Basic Metabolic Panel: Recent Labs  Lab 12/15/19 1332  NA 132*  K 3.8  CL 97*  CO2 24  GLUCOSE 176*  BUN 15  CREATININE 1.05  CALCIUM 8.3*   GFR: Estimated Creatinine Clearance: 80.1 mL/min (by C-G formula based on SCr of 1.05 mg/dL). Liver Function Tests: Recent Labs  Lab 12/15/19 1332  AST 143*  ALT 289*  ALKPHOS 230*  BILITOT 0.7  PROT 5.5*  ALBUMIN 2.0*   No results for input(s): LIPASE, AMYLASE in the last 168 hours. Recent Labs  Lab 12/15/19 1600  AMMONIA 25   Coagulation Profile: Recent Labs  Lab 12/15/19 1332  INR 1.3*   Cardiac Enzymes: No results for input(s): CKTOTAL, CKMB, CKMBINDEX, TROPONINI in the last 168 hours. BNP (last 3 results) No results for input(s): PROBNP in the last 8760 hours. HbA1C: No results for input(s): HGBA1C in the last 72 hours. CBG: No results for input(s): GLUCAP in the last 168 hours. Lipid Profile: No results for input(s): CHOL, HDL, LDLCALC, TRIG, CHOLHDL, LDLDIRECT in the last 72 hours. Thyroid Function Tests: No results for input(s): TSH, T4TOTAL, FREET4, T3FREE, THYROIDAB in the last 72 hours. Anemia Panel: No results for input(s): VITAMINB12, FOLATE, FERRITIN, TIBC, IRON, RETICCTPCT in the last 72 hours. Urine analysis:    Component Value Date/Time   COLORURINE YELLOW 12/15/2019 1600   APPEARANCEUR CLEAR 12/15/2019 1600   LABSPEC 1.012 12/15/2019 1600   PHURINE 6.0 12/15/2019 1600   GLUCOSEU NEGATIVE 12/15/2019 1600   HGBUR NEGATIVE 12/15/2019 1600   BILIRUBINUR NEGATIVE 12/15/2019 1600   KETONESUR NEGATIVE 12/15/2019 1600    PROTEINUR NEGATIVE 12/15/2019 1600   NITRITE NEGATIVE 12/15/2019 1600   LEUKOCYTESUR NEGATIVE 12/15/2019 1600   Sepsis Labs: @LABRCNTIP (procalcitonin:4,lacticidven:4) ) Recent Results (from the past 240 hour(s))  SARS Coronavirus 2 by RT PCR (hospital order, performed in [place] hospital lab) Nasopharyngeal Nasopharyngeal Swab     Status: None   Collection Time: 12/15/19  2:09 PM   Specimen: Nasopharyngeal Swab  Result Value Ref Range Status   SARS Coronavirus 2 NEGATIVE NEGATIVE Final    Comment: (NOTE) SARS-CoV-2 target nucleic acids are NOT DETECTED.  The SARS-CoV-2 RNA is generally detectable in upper and lower respiratory specimens during the acute phase of infection. The lowest concentration of SARS-CoV-2 viral copies this assay can detect is 250 copies / mL. A negative result  does not preclude SARS-CoV-2 infection and should not be used as the sole basis for treatment or other patient management decisions.  A negative result may occur with improper specimen collection / handling, submission of specimen other than nasopharyngeal swab, presence of viral mutation(s) within the areas targeted by this assay, and inadequate number of viral copies (<250 copies / mL). A negative result must be combined with clinical observations, patient history, and epidemiological information.  Fact Sheet for Patients:   StrictlyIdeas.no  Fact Sheet for Healthcare Providers: BankingDealers.co.za  This test is not yet approved or  cleared by the [place] FDA and has been authorized for detection and/or diagnosis of SARS-CoV-2 by FDA under an Emergency Use Authorization (EUA).  This EUA will remain in effect (meaning this test can be used) for the duration of the COVID-19 declaration under Section 564(b)(1) of the Act, 21 U.S.C. section 360bbb-3(b)(1), unless the authorization is terminated or revoked sooner.  Performed at [facility], [place] [address] [place], [place] [address]      Radiological Exams on Admission: CT Head Wo Contrast  Result Date: 12/15/2019 CLINICAL DATA:  Mental status change EXAM: CT HEAD WITHOUT CONTRAST TECHNIQUE: Contiguous axial images were obtained from the base of the skull through the vertex without intravenous contrast. COMPARISON:  09/09/2019 head CT. FINDINGS: Brain: No acute infarct or intracranial hemorrhage. No mass lesion. No midline shift, ventriculomegaly or extra-axial fluid collection. Vascular: No hyperdense vessel or unexpected calcification. Skull: Negative for fracture or focal lesion. Sinuses/Orbits: Normal orbits. Clear paranasal sinuses. No mastoid effusion. Other: None. IMPRESSION: No acute intracranial process. Electronically Signed   By: Primitivo Gauze M.D.   On: 12/15/2019 14:21   DG Chest Port 1 View  Result Date: 12/15/2019 CLINICAL DATA:  Sepsis EXAM: PORTABLE CHEST 1 VIEW COMPARISON:  None. FINDINGS: The heart size and mediastinal contours are within normal limits. No focal airspace consolidation, pleural effusion, or pneumothorax. The visualized skeletal structures are unremarkable. IMPRESSION: No active disease. Electronically Signed   By: Davina Poke D.O.   On: 12/15/2019 14:03   US Abdomen Limited RUQ  Result Date: 12/15/2019 CLINICAL DATA:  Transaminitis EXAM: ULTRASOUND ABDOMEN LIMITED RIGHT UPPER QUADRANT COMPARISON:  None. FINDINGS: Gallbladder: No gallstones or wall thickening visualized. No sonographic Murphy sign noted by sonographer. Common bile duct: Diameter: 4 mm Liver: The liver is enlarged measuring 18.7 cm in the midclavicular line. There is diffuse increased liver echotexture consistent with hepatic steatosis. No focal liver abnormality. Portal vein is patent on color Doppler imaging with normal direction of blood flow towards the liver. Other: Right kidney is unremarkable measuring 12.4 cm. No free fluid. IMPRESSION: 1. Hepatomegaly, with  diffuse hepatic steatosis. 2. Otherwise unremarkable exam. Electronically Signed   By: Randa Ngo M.D.   On: 12/15/2019 18:25      Assessment/Plan Principal Problem:   Sepsis ([place]) Active Problems:   Hepatitis C antibody positive in blood   Hyponatremia   Transaminitis     #1 sepsis: Patient meets criteria for sepsis with a fever heart rate as well as respiratory rate.  No obvious source.  Patient will be initiated on the sepsis pathway with IV antibiotics fluid resuscitation.  Blood cultures obtained.  His positive hepatitis C and transaminitis could be part of the cause.  Unable to do lumbar puncture but again patient has no meningeal symptoms at this point.  We will continue supportive care and wait for culture results.  #2 hepatitis C positive: Not clear if  this is acute infection or cardiac status or even chronic hep C.  Patient had no prior diagnosis.  LFTs elevated at this point with AST 143 ALT 289 total protein 5.4 alkaline phosphatase 230 albumin down to 2.0.  Initial lactic acid as indicated was 1.9.  Patient has normal LFTs back in June.  GI consulted.  Does not appear to be obstructed.  Denied alcohol use denied any excessive Tylenol use or any hepatotoxic medications.  Will follow GI recommendations.  #3 hyponatremia: Resuscitated with some fluids.  Continue monitoring  #4 transaminitis: As per #2 above.  #5 altered mental status: In the setting of fever suspected intracranial cause including meningitis or encephalitis.  Patient however has recovered quickly.  Lumbar puncture attempted but unsuccessful.  Empirically on antibiotics.  Continue to monitor his mental capacity.  Consider MRI of the brain especially with past history of TIA   DVT prophylaxis: Lovenox Code Status: Full code Family Communication: Wife Disposition Plan: Home Consults called: Gastroenterology, LB Admission status: Inpatient  Severity of Illness: The appropriate patient status for this  patient is INPATIENT. Inpatient status is judged to be reasonable and necessary in order to provide the required intensity of service to ensure the patient's safety. The patient's presenting symptoms, physical exam findings, and initial radiographic and laboratory data in the context of their chronic comorbidities is felt to place them at high risk for further clinical deterioration. Furthermore, it is not anticipated that the patient will be medically stable for discharge from the hospital within 2 midnights of admission. The following factors support the patient status of inpatient.   " The patient's presenting symptoms include altered mental status. " The worrisome physical exam findings include mild confusion and dryness. " The initial radiographic and laboratory data are worrisome because of positive hep C and transaminitis. " The chronic co-morbidities include history of TIA.   * I certify that at the point of admission it is my clinical judgment that the patient will require inpatient hospital care spanning beyond 2 midnights from the point of admission due to high intensity of service, high risk for further deterioration and high frequency of surveillance required.Barbette Merino MD [facility] Pager [phone]  If 7PM-7AM, please contact night-coverage [email or web address] Password [facility]  12/15/2019, 7:56 PM

## 2019-12-15 NOTE — ED Provider Notes (Signed)
Care assumed from Krista Blue, PA-C at shift change with ammonia, RUQ U/S pending.  In brief, this patient is a 59 year old male past history of TIA who presents for evaluation of altered mental status.  Wife noticed that approximately on EMS arrival, patient was found to be febrile at 102.5 and tachycardic.  Code sepsis initiated.  Please see previous note from provider for full history/physical exam.   Physical Exam  BP 111/75    Pulse 92    Temp 98.7 F (37.1 C) (Oral)    Resp (!) 28    Ht _0  (1.778 m)    Wt 86.2 kg    SpO2 97%    BMI 27.26 kg/m   Physical Exam   The exam was performed with a chaperone present. Digital Rectal Exam reveals sphincter with good tone.  To nonthrombosed external hemorrhoids.. No masses or fissures. Stool color is brown with no overt blood.   ED Course/Procedures   Clinical Course as of Dec 14 1924  Wed Dec 15, 2019  1544 We attempted LP, but patient was not the most cooperative with procedure given his AMS.  Unsuccessful.  Patient will need to be admitted to hospitalist for his AMS with unclear etiology.  Ammonia still pending.   [GG]    Clinical Course User Index [GG] Corena Herter, PA-C    Procedures   Results for orders placed or performed during the hospital encounter of 12/15/19 (from the past 24 hour(s))  Lactic acid, plasma     Status: None   Collection Time: 12/15/19  1:32 PM  Result Value Ref Range   Lactic Acid, Venous 1.9 0.5 - 1.9 mmol/L  Comprehensive metabolic panel     Status: Abnormal   Collection Time: 12/15/19  1:32 PM  Result Value Ref Range   Sodium 132 (L) 135 - 145 mmol/L   Potassium 3.8 3.5 - 5.1 mmol/L   Chloride 97 (L) 98 - 111 mmol/L   CO2 24 22 - 32 mmol/L   Glucose, Bld 176 (H) 70 - 99 mg/dL   BUN 15 6 - 20 mg/dL   Creatinine, Ser 1.05 0.61 - 1.24 mg/dL   Calcium 8.3 (L) 8.9 - 10.3 mg/dL   Total Protein 5.5 (L) 6.5 - 8.1 g/dL   Albumin 2.0 (L) 3.5 - 5.0 g/dL   AST 143 (H) 15 - 41 U/L   ALT 289 (H) 0 -  44 U/L   Alkaline Phosphatase 230 (H) 38 - 126 U/L   Total Bilirubin 0.7 0.3 - 1.2 mg/dL   GFR calc non Af Amer >60 >60 mL/min   GFR calc Af Amer >60 >60 mL/min   Anion gap 11 5 - 15  CBC WITH DIFFERENTIAL     Status: Abnormal   Collection Time: 12/15/19  1:32 PM  Result Value Ref Range   WBC 9.2 4.0 - 10.5 K/uL   RBC 3.55 (L) 4.22 - 5.81 MIL/uL   Hemoglobin 9.7 (L) 13.0 - 17.0 g/dL   HCT 32.3 (L) 39 - 52 %   MCV 91.0 80.0 - 100.0 fL   MCH 27.3 26.0 - 34.0 pg   MCHC 30.0 30.0 - 36.0 g/dL   RDW 14.4 11.5 - 15.5 %   Platelets 246 150 - 400 K/uL   nRBC 0.0 0.0 - 0.2 %   Neutrophils Relative % 78 %   Neutro Abs 7.1 1.7 - 7.7 K/uL   Lymphocytes Relative 7 %   Lymphs Abs 0.7 0.7 - 4.0 K/uL  Monocytes Relative 14 %   Monocytes Absolute 1.3 (H) 0 - 1 K/uL   Eosinophils Relative 0 %   Eosinophils Absolute 0.0 0 - 0 K/uL   Basophils Relative 0 %   Basophils Absolute 0.0 0 - 0 K/uL   Immature Granulocytes 1 %   Abs Immature Granulocytes 0.06 0.00 - 0.07 K/uL  Protime-INR     Status: Abnormal   Collection Time: 12/15/19  1:32 PM  Result Value Ref Range   Prothrombin Time 15.4 (H) 11.4 - 15.2 seconds   INR 1.3 (H) 0.8 - 1.2  APTT     Status: None   Collection Time: 12/15/19  1:32 PM  Result Value Ref Range   aPTT 27 24 - 36 seconds  SARS Coronavirus 2 by RT PCR (hospital order, performed in [place] hospital lab) Nasopharyngeal Nasopharyngeal Swab     Status: None   Collection Time: 12/15/19  2:09 PM   Specimen: Nasopharyngeal Swab  Result Value Ref Range   SARS Coronavirus 2 NEGATIVE NEGATIVE  Lactic acid, plasma     Status: None   Collection Time: 12/15/19  4:00 PM  Result Value Ref Range   Lactic Acid, Venous 1.4 0.5 - 1.9 mmol/L  Urinalysis, Routine w reflex microscopic Urine, Clean Catch     Status: None   Collection Time: 12/15/19  4:00 PM  Result Value Ref Range   Color, Urine YELLOW YELLOW   APPearance CLEAR CLEAR   Specific Gravity, Urine 1.012 1.005 - 1.030    pH 6.0 5.0 - 8.0   Glucose, UA NEGATIVE NEGATIVE mg/dL   Hgb urine dipstick NEGATIVE NEGATIVE   Bilirubin Urine NEGATIVE NEGATIVE   Ketones, ur NEGATIVE NEGATIVE mg/dL   Protein, ur NEGATIVE NEGATIVE mg/dL   Nitrite NEGATIVE NEGATIVE   Leukocytes,Ua NEGATIVE NEGATIVE  Rapid urine drug screen (hospital performed)     Status: None   Collection Time: 12/15/19  4:00 PM  Result Value Ref Range   Opiates NONE DETECTED NONE DETECTED   Cocaine NONE DETECTED NONE DETECTED   Benzodiazepines NONE DETECTED NONE DETECTED   Amphetamines NONE DETECTED NONE DETECTED   Tetrahydrocannabinol NONE DETECTED NONE DETECTED   Barbiturates NONE DETECTED NONE DETECTED  Ammonia     Status: None   Collection Time: 12/15/19  4:00 PM  Result Value Ref Range   Ammonia 25 9 - 35 umol/L  Hepatitis panel, acute     Status: Abnormal   Collection Time: 12/15/19  4:30 PM  Result Value Ref Range   Hepatitis B Surface Ag NON REACTIVE NON REACTIVE   HCV Ab Reactive (A) NON REACTIVE   Hep A IgM NON REACTIVE NON REACTIVE   Hep B C IgM NON REACTIVE NON REACTIVE  POC occult blood, ED Provider will collect     Status: None   Collection Time: 12/15/19  6:50 PM  Result Value Ref Range   Fecal Occult Bld NEGATIVE NEGATIVE    MDM    PLAN: Patient pending ammonia, right upper quadrant ultrasound.  We will plan for admission.  MDM:  Covid negative.  Lactic is 1.9.  CBC shows no leukocytosis.  Hemoglobin is 9.7.  CMP shows BUN of 15, creatinine of 1.05.  His LFTs are significantly elevated with an AST of 143, ALT of 289, alk phos of 230.  Total bili is 0.7.  The last LFTs that he has gotten were about 3 months ago which were normal at that time.  Per previous provider, patient not significantly tender in  the right upper quadrant.  We will plan for right upper quadrant ultrasound.  Previous team attempted LP but patient was uncooperative and was moving.  Previous PA discussed with Dr. Nyoka Cowden (IR) who recommends stabilization  overnight with attempt at IR LP tomorrow. Patient has been covered with broad spectrum antibiotics.   Ammonia is normal. UDS is unremarkable. UA shows no evidence of infectious etiology.  HCV antibody is reactive.  Given his anemia, fecal occult was done.  No obvious blood on digital rectal exam, and is negative.  Patient will be admitted for altered mental status, transaminitis.  I have secure messaged [facility] for there consult during admission.   Discussed patient with Dr. Jonelle Sidle (hospitalist) who accepts patient for admission.   1. Altered mental status, unspecified altered mental status type   2. Transaminitis    Portions of this note were generated with [employer]. Dictation errors may occur despite best attempts at proofreading.    Volanda Napoleon, PA-C 12/15/19 1926    Breck Coons, MD 12/16/19 [phone]

## 2019-12-15 NOTE — ED Provider Notes (Signed)
.  Lumbar Puncture  Date/Time: 12/15/2019 5:57 PM Performed by: Wyvonnia Dusky, MD Authorized by: Wyvonnia Dusky, MD   Consent:    Consent obtained:  Written   Consent given by:  Spouse   Risks discussed:  Bleeding, infection, headache, nerve damage, repeat procedure and pain   Alternatives discussed:  No treatment and delayed treatment Pre-procedure details:    Procedure purpose:  Diagnostic   Preparation: Patient was prepped and draped in usual sterile fashion   Sedation:    Sedation type:  Anxiolysis Anesthesia (see MAR for exact dosages):    Anesthesia method:  Local infiltration   Local anesthetic:  Lidocaine 1% w/o epi Procedure details:    Lumbar space:  L4-L5 interspace   Patient position:  Sitting   Needle gauge:  22   Ultrasound guidance: no     Number of attempts:  2 Comments:     Unsuccessful LP due to patient noncompliance, aborted after 2 attempts to achieve CSF      Wyvonnia Dusky, MD 12/15/19 1758

## 2019-12-16 ENCOUNTER — Other Ambulatory Visit: Payer: Self-pay

## 2019-12-16 ENCOUNTER — Inpatient Hospital Stay (HOSPITAL_COMMUNITY): Payer: Self-pay

## 2019-12-16 LAB — COMPREHENSIVE METABOLIC PANEL
ALT: 307 U/L — ABNORMAL HIGH (ref 0–44)
AST: 132 U/L — ABNORMAL HIGH (ref 15–41)
Albumin: 1.9 g/dL — ABNORMAL LOW (ref 3.5–5.0)
Alkaline Phosphatase: 202 U/L — ABNORMAL HIGH (ref 38–126)
Anion gap: 11 (ref 5–15)
BUN: 11 mg/dL (ref 6–20)
CO2: 24 mmol/L (ref 22–32)
Calcium: 8.1 mg/dL — ABNORMAL LOW (ref 8.9–10.3)
Chloride: 100 mmol/L (ref 98–111)
Creatinine, Ser: 0.89 mg/dL (ref 0.61–1.24)
GFR calc Af Amer: >60 mL/min (ref >60)
GFR calc non Af Amer: >60 mL/min (ref >60)
Glucose, Bld: 150 mg/dL — ABNORMAL HIGH (ref 70–99)
Potassium: 3.7 mmol/L (ref 3.5–5.1)
Sodium: 135 mmol/L (ref 135–145)
Total Bilirubin: 0.9 mg/dL (ref 0.3–1.2)
Total Protein: 5.3 g/dL — ABNORMAL LOW (ref 6.5–8.1)

## 2019-12-16 LAB — CORTISOL-AM, BLOOD: Cortisol - AM: 21.8 ug/dL (ref 6.7–22.6)

## 2019-12-16 LAB — CBC
HCT: 29.9 % — ABNORMAL LOW (ref 39.0–52.0)
Hemoglobin: 9.1 g/dL — ABNORMAL LOW (ref 13.0–17.0)
MCH: 27.5 pg (ref 26.0–34.0)
MCHC: 30.4 g/dL (ref 30.0–36.0)
MCV: 90.3 fL (ref 80.0–100.0)
Platelets: 204 10*3/uL (ref 150–400)
RBC: 3.31 MIL/uL — ABNORMAL LOW (ref 4.22–5.81)
RDW: 14.6 % (ref 11.5–15.5)
WBC: 6.1 10*3/uL (ref 4.0–10.5)
nRBC: 0 % (ref 0.0–0.2)

## 2019-12-16 LAB — PROTIME-INR
INR: 1.3 — ABNORMAL HIGH (ref 0.8–1.2)
Prothrombin Time: 15.3 seconds — ABNORMAL HIGH (ref 11.4–15.2)

## 2019-12-16 LAB — URINE CULTURE: Culture: NO GROWTH

## 2019-12-16 LAB — HIV ANTIBODY (ROUTINE TESTING W REFLEX): HIV Screen 4th Generation wRfx: NONREACTIVE

## 2019-12-16 LAB — PROCALCITONIN: Procalcitonin: 0.25 ng/mL

## 2019-12-16 IMAGING — MR MR HEAD W/O CM
4 of 6 series · 19 of 48 positions shown · non-contrast
Comparison: Noncontrast head CT [DATE].

CLINICAL DATA: Provided history: Mental status change, unknown
cause. Additional provided: Altered and afebrile, tachycardic.

EXAM:
MRI HEAD WITHOUT CONTRAST
TECHNIQUE: Multiplanar, multiecho pulse sequences of the brain and surrounding
structures were obtained without intravenous contrast.

[Series 2: DWI · axial · 3.0mm · 0.94mm/px · z∈[-83,+63]mm · 9 of 100 slices shown (1 of 2)]
[im 1/100]
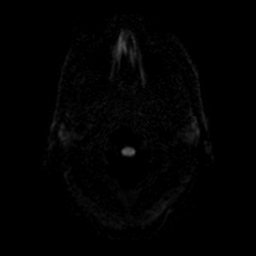
[im 15/100]
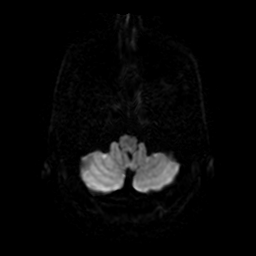
[im 29/100]
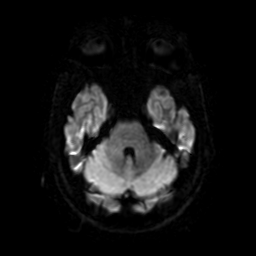
[im 43/100]
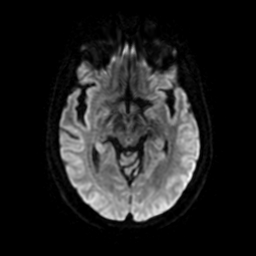
[im 50/100]
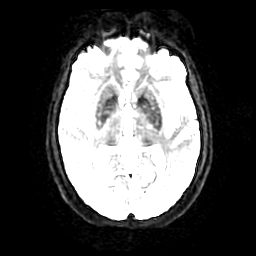
[im 57/100]
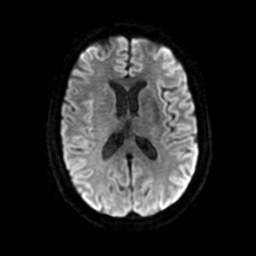
[im 71/100]
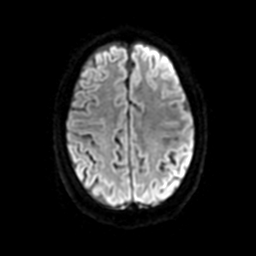
[im 85/100]
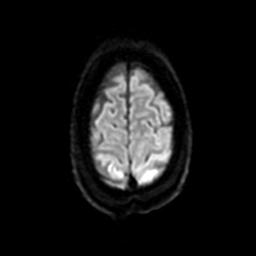
[im 100/100]
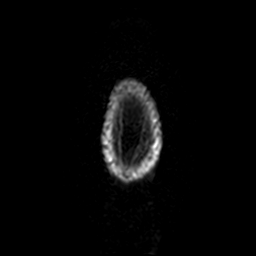

[Series 3: DWI · coronal · 4.0mm · 0.94mm/px · 3 of 74 slices shown (2 of 2)]
[im 8/74]
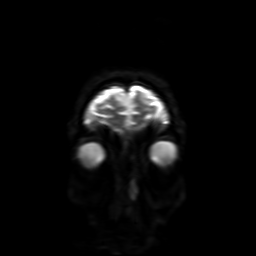
[im 37/74]
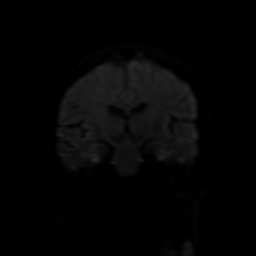
[im 66/74]
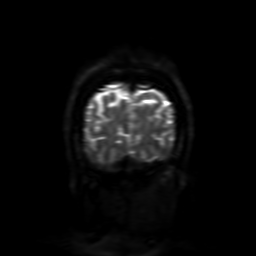

[Series 4: FLAIR · sagittal · 5.0mm · 0.23mm/px · 4 of 25 slices shown]
[im 1/25]
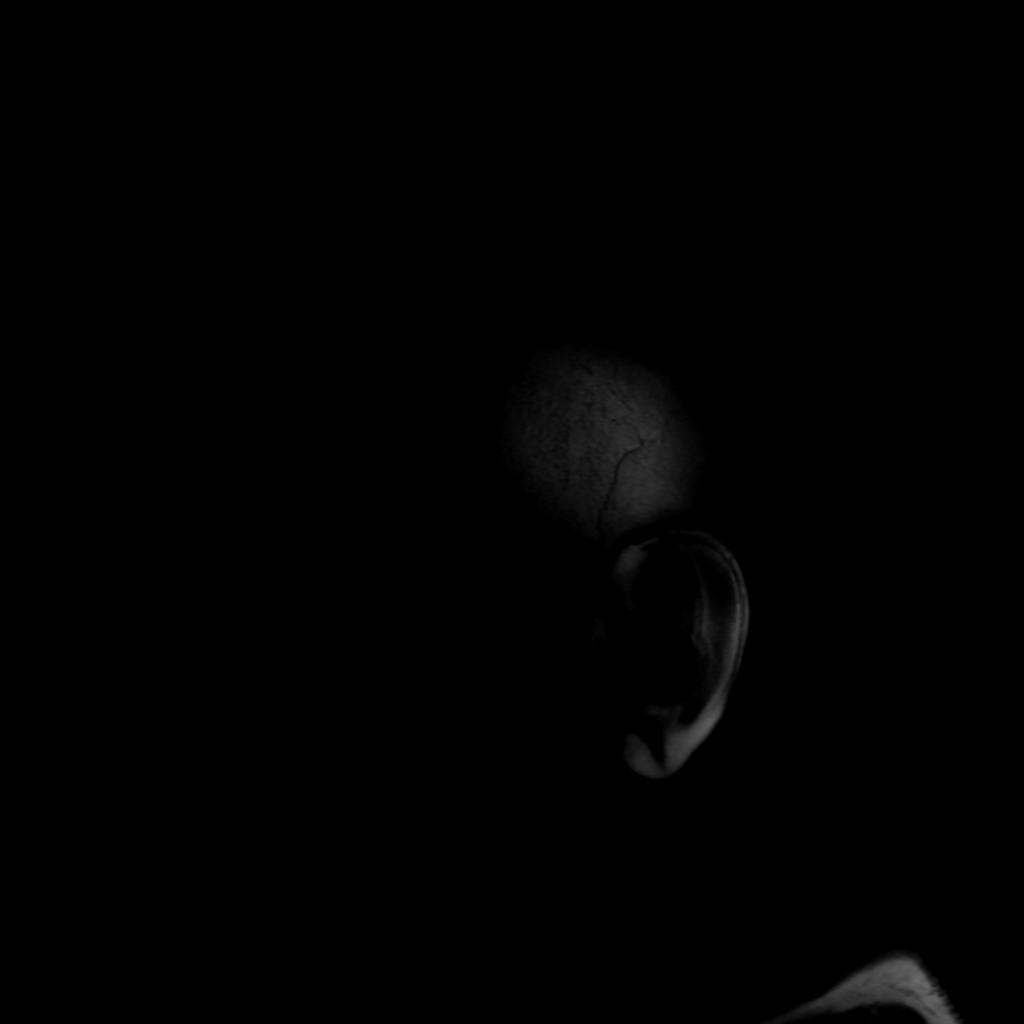
[im 9/25]
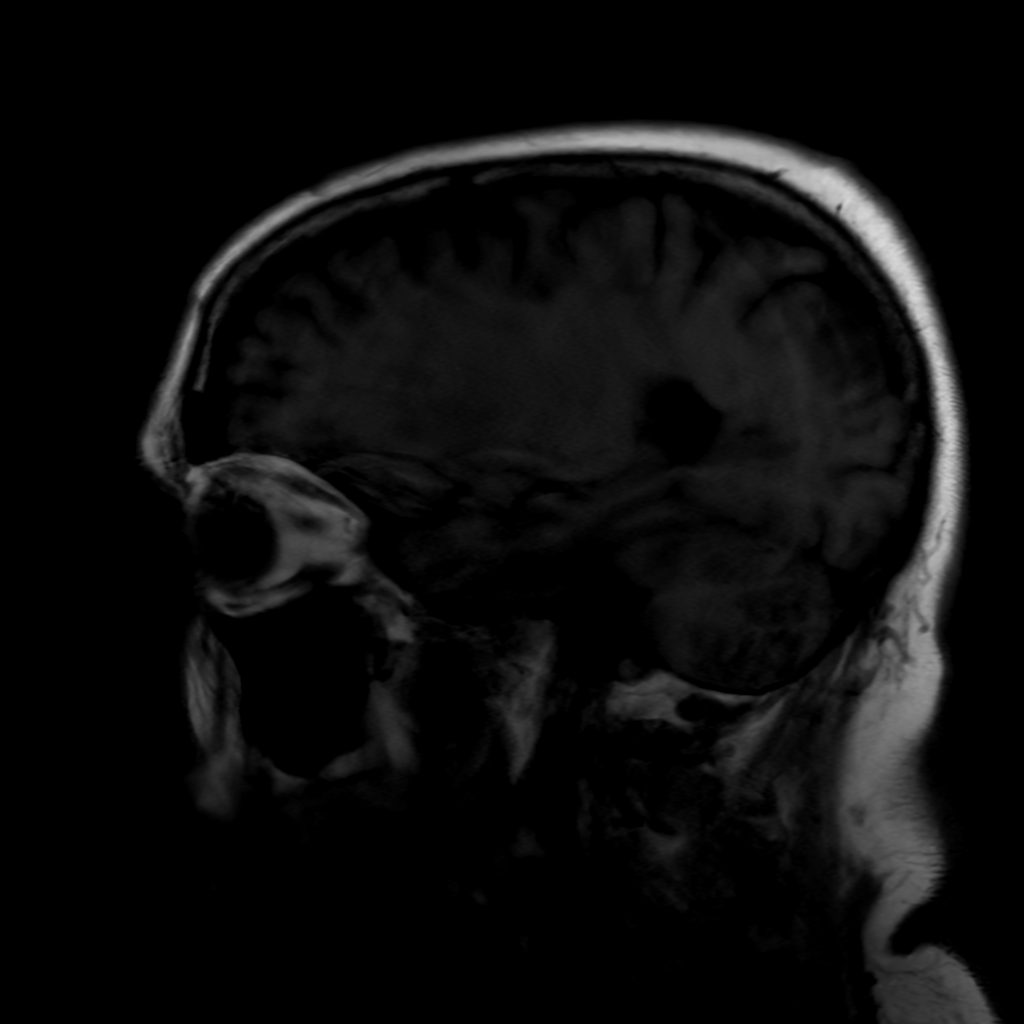
[im 17/25]
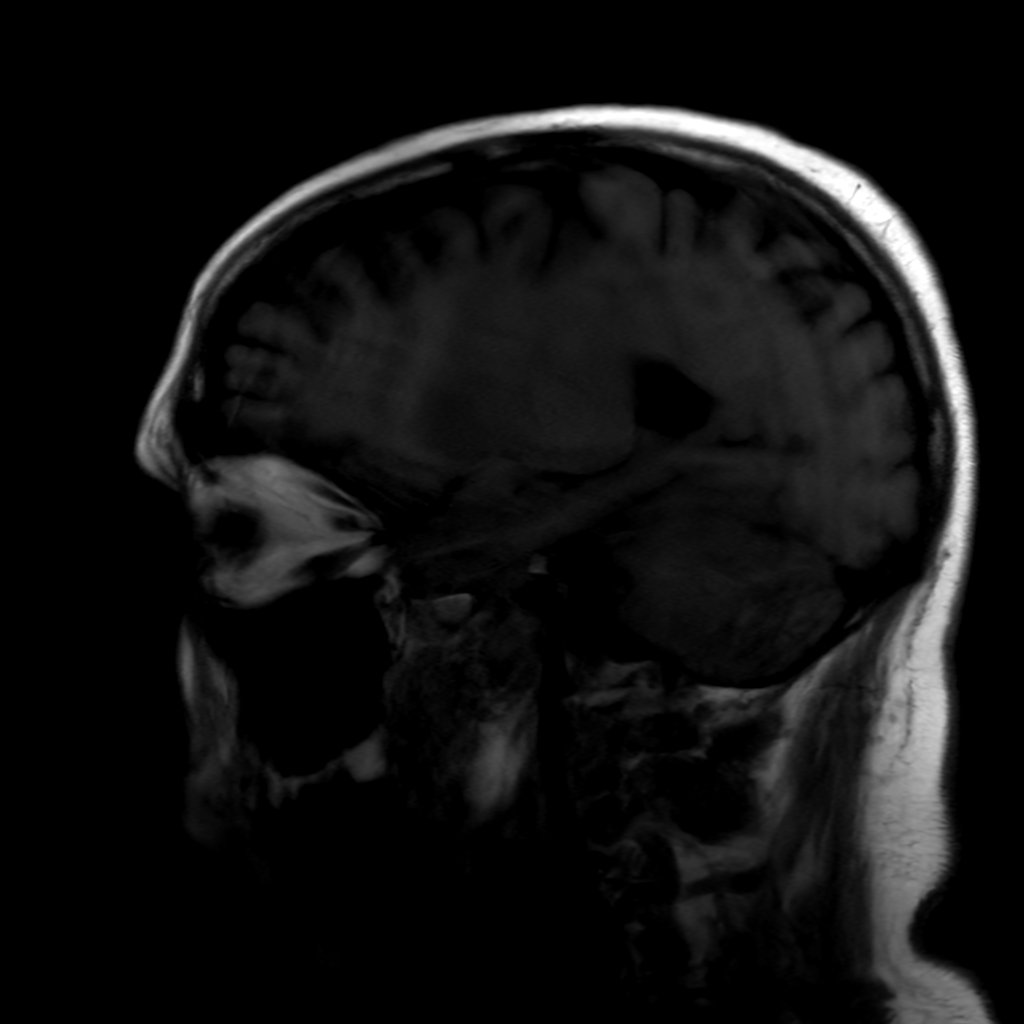
[im 25/25]
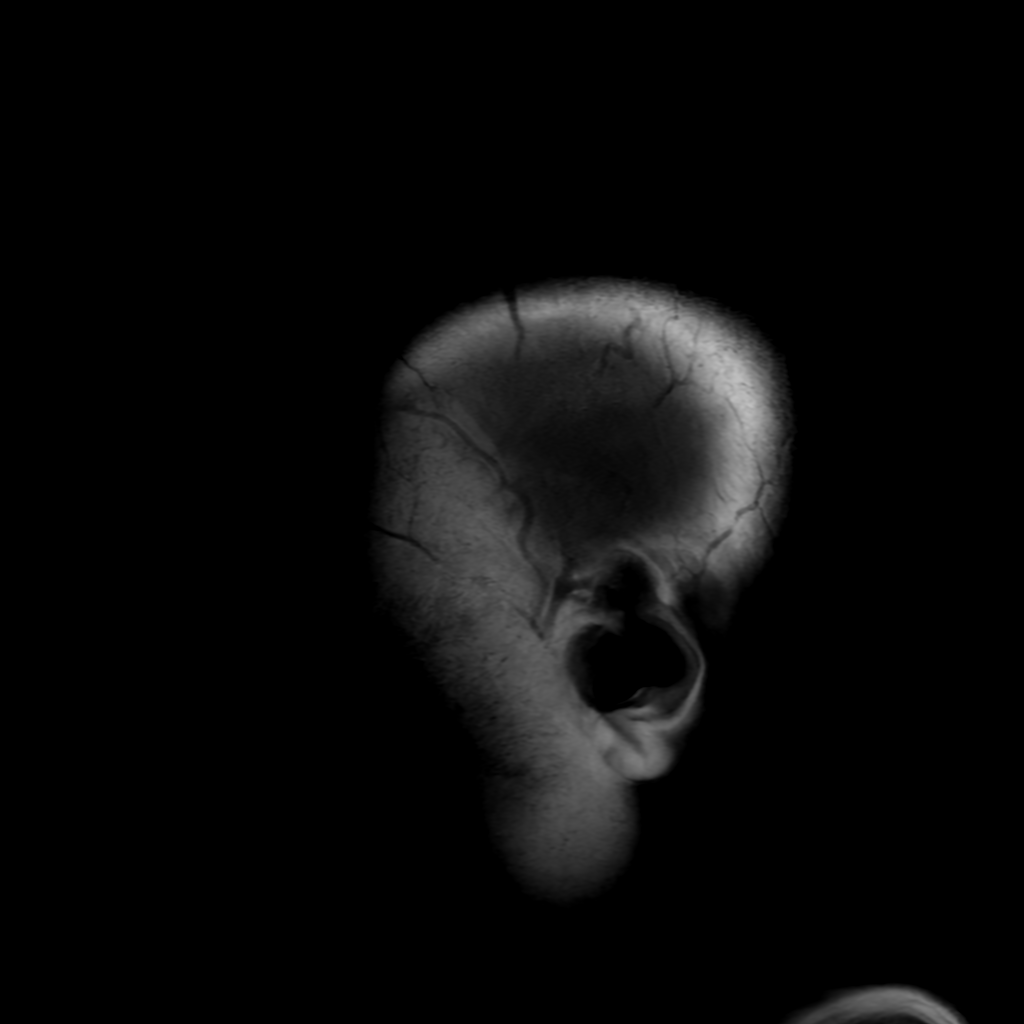

[Series 250: ADC · axial · 3.0mm · 0.94mm/px · z∈[-63,+42]mm · 3 of 50 slices shown]
[im 8/50]
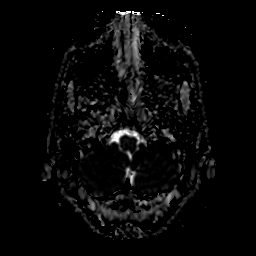
[im 29/50]
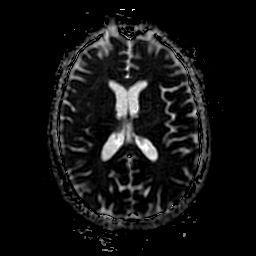
[im 43/50]
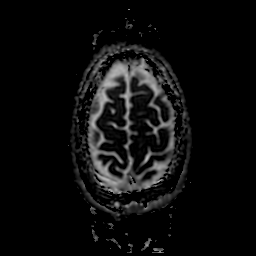

[19 of 48 positions shown; findings below may reference images not displayed]

FINDINGS: Brain:

The patient was unable to tolerate the full examination. As a
result, only axial and coronal diffusion-weighted sequences, an
axial T1 weighted sequence and an axial T2 weighted sequence could
be obtained. The sagittal T1 weighted and axial T2 weighted
sequences are moderately motion degraded.

Cerebral volume is normal for age.

There is no acute infarct.

Within described limitations, there is no evidence of an
intracranial mass. No extra-axial fluid collection is identified. No
midline shift.

Vascular: Expected proximal arterial flow voids.

Skull and upper cervical spine: Within described limitations, no
focal marrow lesion is identified.

Sinuses/Orbits: Visualized orbits show no acute finding. Mild
ethmoid sinus mucosal thickening. Trace left mastoid effusion.
IMPRESSION: Prematurely terminated and motion degraded examination as described.

The diffusion-weighted imaging is of good quality. There is no
evidence of acute infarct.

Mild ethmoid sinus mucosal thickening.

Trace left mastoid effusion.

## 2019-12-16 MED ORDER — ACETAMINOPHEN 325 MG PO TABS
650.0000 mg | ORAL_TABLET | Freq: Four times a day (QID) | ORAL | Status: DC | PRN
  Administered 2019-12-16 – 2019-12-21 (×8): 650 mg via ORAL
  Filled 2019-12-16 (×9): qty 2

## 2019-12-16 NOTE — ED Notes (Signed)
Attempted to call report, RN will call back.

## 2019-12-16 NOTE — ED Notes (Signed)
Pt continues to remove monitoring equipment despite redirecting patient. Pt laying in bed in NAD. Bed in low position.

## 2019-12-16 NOTE — ED Notes (Signed)
Assuming care of patient at this time. Pt resting with eyes closed. Resp even and unlabored. Skin warm and dry. Bed in low position.

## 2019-12-16 NOTE — ED Notes (Signed)
Pt found in restroom outside of room, gown off, leads off, PIV taken out and medication infusing into floor. Redirected patient back to room after using restroom, instructed pt to call if needing help or to use bathroom. Pt verbalized understanding. Gown replaced and pt placed back into bed with BP cuff and pulse ox on.

## 2019-12-16 NOTE — ED Notes (Signed)
Pt continues to remove telemetry leads despite redirection.

## 2019-12-16 NOTE — ED Notes (Addendum)
Pt removed infusing IV. No damage at site, IV infusions re-started with new tubing at other IV site.

## 2019-12-16 NOTE — ED Notes (Signed)
Report called, pt to go to 3M-6 via stretcher by tech.

## 2019-12-16 NOTE — Consult Note (Signed)
Reason for Consult: Abnormal liver enzymes, AMS, and history of HCV Referring Physician: [facility]  Maryella Shivers HPI: This is a 59 year old male with a PMH of TIA and HCV s/p treatment in 2008 through [facility] admitted with AMS.  The patient and his wife, who provides most of the history, reported that he started to experience an upper respiratory issue 3 weeks ago.  He tested negative for COVID at work and he was treated with steroids and some other medication.  He developed a worsening cough and two weeks ago he was started on doxycycline.  Over this past week the patient was not fairing any better and his wife noticed that he was not as mentally acute two days prior to admission.  On the day of admission, he was not himself and EMS was required to transport him to the hospital.  His work up showed that his liver enzymes were elevated:  AST 143, ALT 289, AP 230, and TB 0.7.  Imaging of his head did not reveal any acute abnormalities.  The RUQ U/S today showed hepatomegaly and steatosis.  With supportive care he is better today, but his wife states that he is not back to his baseline.  There is no history of any new medications outside of the recently treatment for his upper respiratory issues or any herbal medication use.  He does drink ETOH, but he states that it is minimal.  Certainly, he denies any use of ETOH over the past three weeks with his upper respiratory complaints.  At a baseline he only drinks 1 lemonade beer/seltzer every few days.  Past Medical History:  Diagnosis Date  . TIA (transient ischemic attack)     No past surgical history on file.  No family history on file.  Social History:  has no history on file for tobacco use, alcohol use, and drug use.  Allergies: No Known Allergies  Medications:  Scheduled:  Continuous: . ceFEPime (MAXIPIME) IV Stopped (12/16/19 9371)  . dextrose 5 % and 0.9% NaCl 100 mL/hr at 12/16/19 1046  . lactated ringers    . metronidazole  Stopped (12/16/19 6967)  . vancomycin      Results for orders placed or performed during the hospital encounter of 12/15/19 (from the past 24 hour(s))  Lactic acid, plasma     Status: None   Collection Time: 12/15/19  1:32 PM  Result Value Ref Range   Lactic Acid, Venous 1.9 0.5 - 1.9 mmol/L  Comprehensive metabolic panel     Status: Abnormal   Collection Time: 12/15/19  1:32 PM  Result Value Ref Range   Sodium 132 (L) 135 - 145 mmol/L   Potassium 3.8 3.5 - 5.1 mmol/L   Chloride 97 (L) 98 - 111 mmol/L   CO2 24 22 - 32 mmol/L   Glucose, Bld 176 (H) 70 - 99 mg/dL   BUN 15 6 - 20 mg/dL   Creatinine, Ser 1.05 0.61 - 1.24 mg/dL   Calcium 8.3 (L) 8.9 - 10.3 mg/dL   Total Protein 5.5 (L) 6.5 - 8.1 g/dL   Albumin 2.0 (L) 3.5 - 5.0 g/dL   AST 143 (H) 15 - 41 U/L   ALT 289 (H) 0 - 44 U/L   Alkaline Phosphatase 230 (H) 38 - 126 U/L   Total Bilirubin 0.7 0.3 - 1.2 mg/dL   GFR calc non Af Amer >60 >60 mL/min   GFR calc Af Amer >60 >60 mL/min   Anion gap 11 5 - 15  CBC WITH DIFFERENTIAL     Status: Abnormal   Collection Time: 12/15/19  1:32 PM  Result Value Ref Range   WBC 9.2 4.0 - 10.5 K/uL   RBC 3.55 (L) 4.22 - 5.81 MIL/uL   Hemoglobin 9.7 (L) 13.0 - 17.0 g/dL   HCT 32.3 (L) 39 - 52 %   MCV 91.0 80.0 - 100.0 fL   MCH 27.3 26.0 - 34.0 pg   MCHC 30.0 30.0 - 36.0 g/dL   RDW 14.4 11.5 - 15.5 %   Platelets 246 150 - 400 K/uL   nRBC 0.0 0.0 - 0.2 %   Neutrophils Relative % 78 %   Neutro Abs 7.1 1.7 - 7.7 K/uL   Lymphocytes Relative 7 %   Lymphs Abs 0.7 0.7 - 4.0 K/uL   Monocytes Relative 14 %   Monocytes Absolute 1.3 (H) 0 - 1 K/uL   Eosinophils Relative 0 %   Eosinophils Absolute 0.0 0 - 0 K/uL   Basophils Relative 0 %   Basophils Absolute 0.0 0 - 0 K/uL   Immature Granulocytes 1 %   Abs Immature Granulocytes 0.06 0.00 - 0.07 K/uL  Protime-INR     Status: Abnormal   Collection Time: 12/15/19  1:32 PM  Result Value Ref Range   Prothrombin Time 15.4 (H) 11.4 - 15.2 seconds    INR 1.3 (H) 0.8 - 1.2  APTT     Status: None   Collection Time: 12/15/19  1:32 PM  Result Value Ref Range   aPTT 27 24 - 36 seconds  Blood Culture (routine x 2)     Status: None (Preliminary result)   Collection Time: 12/15/19  1:32 PM   Specimen: BLOOD  Result Value Ref Range   Specimen Description BLOOD LEFT ANTECUBITAL    Special Requests      BOTTLES DRAWN AEROBIC AND ANAEROBIC Blood Culture results may not be optimal due to an inadequate volume of blood received in culture bottles   Culture      NO GROWTH < 24 HOURS Performed at [facility], [address], [place], [place] [address]    Report Status PENDING   Blood Culture (routine x 2)     Status: None (Preliminary result)   Collection Time: 12/15/19  1:48 PM   Specimen: BLOOD RIGHT FOREARM  Result Value Ref Range   Specimen Description BLOOD RIGHT FOREARM    Special Requests      BOTTLES DRAWN AEROBIC AND ANAEROBIC Blood Culture results may not be optimal due to an inadequate volume of blood received in culture bottles   Culture      NO GROWTH < 24 HOURS Performed at [facility], [place] [address], [place], [place] [address]    Report Status PENDING   SARS Coronavirus 2 by RT PCR (hospital order, performed in [facility] hospital lab) Nasopharyngeal Nasopharyngeal Swab     Status: None   Collection Time: 12/15/19  2:09 PM   Specimen: Nasopharyngeal Swab  Result Value Ref Range   SARS Coronavirus 2 NEGATIVE NEGATIVE  Lactic acid, plasma     Status: None   Collection Time: 12/15/19  4:00 PM  Result Value Ref Range   Lactic Acid, Venous 1.4 0.5 - 1.9 mmol/L  Urinalysis, Routine w reflex microscopic Urine, Clean Catch     Status: None   Collection Time: 12/15/19  4:00 PM  Result Value Ref Range   Color, Urine YELLOW YELLOW   APPearance CLEAR CLEAR  Specific Gravity, Urine 1.012 1.005 - 1.030   pH 6.0 5.0 - 8.0   Glucose, UA NEGATIVE NEGATIVE mg/dL   Hgb urine dipstick NEGATIVE NEGATIVE   Bilirubin Urine  NEGATIVE NEGATIVE   Ketones, ur NEGATIVE NEGATIVE mg/dL   Protein, ur NEGATIVE NEGATIVE mg/dL   Nitrite NEGATIVE NEGATIVE   Leukocytes,Ua NEGATIVE NEGATIVE  Rapid urine drug screen (hospital performed)     Status: None   Collection Time: 12/15/19  4:00 PM  Result Value Ref Range   Opiates NONE DETECTED NONE DETECTED   Cocaine NONE DETECTED NONE DETECTED   Benzodiazepines NONE DETECTED NONE DETECTED   Amphetamines NONE DETECTED NONE DETECTED   Tetrahydrocannabinol NONE DETECTED NONE DETECTED   Barbiturates NONE DETECTED NONE DETECTED  Ammonia     Status: None   Collection Time: 12/15/19  4:00 PM  Result Value Ref Range   Ammonia 25 9 - 35 umol/L  Hepatitis panel, acute     Status: Abnormal   Collection Time: 12/15/19  4:30 PM  Result Value Ref Range   Hepatitis B Surface Ag NON REACTIVE NON REACTIVE   HCV Ab Reactive (A) NON REACTIVE   Hep A IgM NON REACTIVE NON REACTIVE   Hep B C IgM NON REACTIVE NON REACTIVE  POC occult blood, ED Provider will collect     Status: None   Collection Time: 12/15/19  6:50 PM  Result Value Ref Range   Fecal Occult Bld NEGATIVE NEGATIVE  HIV Antibody (routine testing w rflx)     Status: None   Collection Time: 12/16/19  3:51 AM  Result Value Ref Range   HIV Screen 4th Generation wRfx Non Reactive Non Reactive  Protime-INR     Status: Abnormal   Collection Time: 12/16/19  3:51 AM  Result Value Ref Range   Prothrombin Time 15.3 (H) 11.4 - 15.2 seconds   INR 1.3 (H) 0.8 - 1.2  Cortisol-am, blood     Status: None   Collection Time: 12/16/19  3:51 AM  Result Value Ref Range   Cortisol - AM 21.8 6.7 - 22.6 ug/dL  Procalcitonin     Status: None   Collection Time: 12/16/19  3:51 AM  Result Value Ref Range   Procalcitonin 0.25 ng/mL  CBC     Status: Abnormal   Collection Time: 12/16/19  3:51 AM  Result Value Ref Range   WBC 6.1 4.0 - 10.5 K/uL   RBC 3.31 (L) 4.22 - 5.81 MIL/uL   Hemoglobin 9.1 (L) 13.0 - 17.0 g/dL   HCT 29.9 (L) 39 - 52 %   MCV  90.3 80.0 - 100.0 fL   MCH 27.5 26.0 - 34.0 pg   MCHC 30.4 30.0 - 36.0 g/dL   RDW 14.6 11.5 - 15.5 %   Platelets 204 150 - 400 K/uL   nRBC 0.0 0.0 - 0.2 %  Comprehensive metabolic panel     Status: Abnormal   Collection Time: 12/16/19  3:51 AM  Result Value Ref Range   Sodium 135 135 - 145 mmol/L   Potassium 3.7 3.5 - 5.1 mmol/L   Chloride 100 98 - 111 mmol/L   CO2 24 22 - 32 mmol/L   Glucose, Bld 150 (H) 70 - 99 mg/dL   BUN 11 6 - 20 mg/dL   Creatinine, Ser 0.89 0.61 - 1.24 mg/dL   Calcium 8.1 (L) 8.9 - 10.3 mg/dL   Total Protein 5.3 (L) 6.5 - 8.1 g/dL   Albumin 1.9 (L) 3.5 - 5.0 g/dL  AST 132 (H) 15 - 41 U/L   ALT 307 (H) 0 - 44 U/L   Alkaline Phosphatase 202 (H) 38 - 126 U/L   Total Bilirubin 0.9 0.3 - 1.2 mg/dL   GFR calc non Af Amer >60 >60 mL/min   GFR calc Af Amer >60 >60 mL/min   Anion gap 11 5 - 15     CT Head Wo Contrast  Result Date: 12/15/2019 CLINICAL DATA:  Mental status change EXAM: CT HEAD WITHOUT CONTRAST TECHNIQUE: Contiguous axial images were obtained from the base of the skull through the vertex without intravenous contrast. COMPARISON:  09/09/2019 head CT. FINDINGS: Brain: No acute infarct or intracranial hemorrhage. No mass lesion. No midline shift, ventriculomegaly or extra-axial fluid collection. Vascular: No hyperdense vessel or unexpected calcification. Skull: Negative for fracture or focal lesion. Sinuses/Orbits: Normal orbits. Clear paranasal sinuses. No mastoid effusion. Other: None. IMPRESSION: No acute intracranial process. Electronically Signed   By: Primitivo Gauze M.D.   On: 12/15/2019 14:21   MR BRAIN WO CONTRAST  Result Date: 12/16/2019 CLINICAL DATA:  Provided history: Mental status change, unknown cause. Additional provided: Altered and afebrile, tachycardic. EXAM: MRI HEAD WITHOUT CONTRAST TECHNIQUE: Multiplanar, multiecho pulse sequences of the brain and surrounding structures were obtained without intravenous contrast. COMPARISON:   Noncontrast head CT 12/15/2019. FINDINGS: Brain: The patient was unable to tolerate the full examination. As a result, only axial and coronal diffusion-weighted sequences, an axial T1 weighted sequence and an axial T2 weighted sequence could be obtained. The sagittal T1 weighted and axial T2 weighted sequences are moderately motion degraded. Cerebral volume is normal for age. There is no acute infarct. Within described limitations, there is no evidence of an intracranial mass. No extra-axial fluid collection is identified. No midline shift. Vascular: Expected proximal arterial flow voids. Skull and upper cervical spine: Within described limitations, no focal marrow lesion is identified. Sinuses/Orbits: Visualized orbits show no acute finding. Mild ethmoid sinus mucosal thickening. Trace left mastoid effusion. IMPRESSION: Prematurely terminated and motion degraded examination as described. The diffusion-weighted imaging is of good quality. There is no evidence of acute infarct. Mild ethmoid sinus mucosal thickening. Trace left mastoid effusion. Electronically Signed   By: Kellie Simmering DO   On: 12/16/2019 10:29   DG Chest Port 1 View  Result Date: 12/15/2019 CLINICAL DATA:  Sepsis EXAM: PORTABLE CHEST 1 VIEW COMPARISON:  None. FINDINGS: The heart size and mediastinal contours are within normal limits. No focal airspace consolidation, pleural effusion, or pneumothorax. The visualized skeletal structures are unremarkable. IMPRESSION: No active disease. Electronically Signed   By: Davina Poke D.O.   On: 12/15/2019 14:03   US Abdomen Limited RUQ  Result Date: 12/15/2019 CLINICAL DATA:  Transaminitis EXAM: ULTRASOUND ABDOMEN LIMITED RIGHT UPPER QUADRANT COMPARISON:  None. FINDINGS: Gallbladder: No gallstones or wall thickening visualized. No sonographic Murphy sign noted by sonographer. Common bile duct: Diameter: 4 mm Liver: The liver is enlarged measuring 18.7 cm in the midclavicular line. There is diffuse  increased liver echotexture consistent with hepatic steatosis. No focal liver abnormality. Portal vein is patent on color Doppler imaging with normal direction of blood flow towards the liver. Other: Right kidney is unremarkable measuring 12.4 cm. No free fluid. IMPRESSION: 1. Hepatomegaly, with diffuse hepatic steatosis. 2. Otherwise unremarkable exam. Electronically Signed   By: Randa Ngo M.D.   On: 12/15/2019 18:25    ROS:  As stated above in the HPI otherwise negative.  Blood pressure 107/60, pulse 97, temperature 98.8 F (37.1 C),  temperature source Oral, resp. rate 18, height 5\' 10"  (1.778 m), weight 86.2 kg, SpO2 94 %.    PE: Gen: NAD, Alert and Oriented HEENT:  [place]/AT, EOMI Neck: Supple, no LAD Lungs: CTA Bilaterally CV: RRR without M/G/R ABD: Soft, NTND, +BS Ext: No C/C/E, no asterixis  Assessment/Plan: 1) Abnormal liver enzymes. 2) AMS - improving. 3) History of HCV. 4) Hepatic steatosis.   The abnormal liver enzymes is most likely a drug reaction.  In this case it is DILI (Drug-induced liver injury).  Doxycycline is the probable cause as he denies any other recent medications. His liver enzymes appear to be improving.  It is not known if the DILI is the source of the AMS, contributing to the AMS, or independent of the AMS.  He has a history of HCV and it sounds as if he had genotype 1 as he was treated with INF and ribavirin for a years.  The records show that he was seen by Dr. Patsy Baltimore and Maceo Pro, but the actual records cannot be reviewed.  He recalls being told that he was "cured" of HCV, which is essentially SVR (sustained viral response).  His most recent liver enzymes on 08/2019 were completely normal.  It is unlikely that he has a recurrence of his HCV causing his liver enzyme abnormalities, but it is proper to check for any recurrence with an HCV RNA.  He will always be HCV Ab +, even with treatment.  Currently there is no evidence of hepatic encephalopathy.  Plan: 1)   Check HCV RNA to ensure that he does not have a flare. 2) Follow liver panel. 3) Continue supportive care. Nima Bamburg D 12/16/2019, 12:11 PM

## 2019-12-16 NOTE — ED Notes (Signed)
Pt up walking to restroom. Pt was naked when he first walked out of room.

## 2019-12-16 NOTE — ED Notes (Signed)
Pt ambulated to bathroom with no assistance. Pt ambulatory with steady gait. Wife at bedside.

## 2019-12-16 NOTE — Progress Notes (Signed)
PROGRESS NOTE    Brian Ochoa  XBM:[id number] DOB: Oct 12, 1960 DOA: 12/15/2019 PCP: Aletha Halim., PA-C   Brief Narrative:  59 years old male with medical history significant for prior history of TIA presented in the ER after his wife noted him to be confused and febrile.  Patient has been admitted for sepsis.  No obvious source was noted,  all the work-up done so far proved negative except positive for hepatitis C and elevated liver enzymes.  RUQ ultrasound showed no evidence of gallstones or obstructive uropathy.  Lumbar puncture was attempted but was not successful.  Urine drug screen is negative..  GI was consulted,  awaiting recommendation.  Given history of TIA,  MRI was completed which is unremarkable for acute infarct.  Assessment & Plan:   Principal Problem:   Sepsis ([place]) Active Problems:   Hepatitis C antibody positive in blood   Hyponatremia   Transaminitis   Sepsis could be due to acute hepatitis:  Patient met criteria for sepsis with a fever, increased heart rate as well as respiratory rate at admission.  No obvious source. Suspecting could be acute hepatitis C. Started on sepsis pathway with IV antibiotics and fluid resuscitation.  Initial lactic acid as indicated was 1.9. Blood cultures : No growth so far,  His positive hepatitis C and transaminitis could be part of the cause. LP attempted but not successful,  Patient has no meningeal symptoms at this point.  We will continue supportive care and wait for culture results.  Continue vancomycin and cefepime for now.  Hepatitis C positive:  Not clear if this is acute infection or chronic hep C.  Patient had no prior diagnosis.  LFTs elevated AST 143, ALT 289, Patient has normal LFTs back in June.  GI consulted.  Awaiting recommendation. Does not appear to be obstructed. Denies alcohol use denied any excessive Tylenol use or any hepatotoxic medications.    Hyponatremia: Resuscitated with some fluids.   Improved.  Elevated Liver Enzymes : Ultrasound abdomen shows hepatic steatosis.  Continue supportive care, recheck labs.  Altered mental status: >> Improving. In the setting of fever suspected intracranial cause including meningitis or encephalitis. Lumbar puncture attempted but unsuccessful.  Empirically started on antibiotics..  Patient however has recovered quickly.  MRI brain negative for any acute abnormality.  Patient is alert , oriented x 3, slightly confused.  DVT prophylaxis: Lovenox Code Status: Full code Family Communication: Discussed with patient no one at bedside. Disposition Plan:Status is: Inpatient  Remains inpatient appropriate because:Inpatient level of care appropriate due to severity of illness   Dispo: The patient is from: Home              Anticipated d/c is to: Home              Anticipated d/c date is: 2 days              Patient currently is not medically stable to d/c.  Consultants:   Gastroenterology  Procedures: CT head, MRI brain, ultrasound abdomen. Antimicrobials: Anti-infectives (From admission, onward)   Start     Dose/Rate Route Frequency Ordered Stop   12/16/19 1000  vancomycin (VANCOREADY) IVPB 1250 mg/250 mL        1,250 mg 166.7 mL/hr over 90 Minutes Intravenous Every 12 hours 12/15/19 1504     12/15/19 2345  ceFEPIme (MAXIPIME) 2 g in sodium chloride 0.9 % 100 mL IVPB  Status:  Discontinued        2 g 200 mL/hr  over 30 Minutes Intravenous  Once 12/15/19 2332 12/15/19 2336   12/15/19 2345  metroNIDAZOLE (FLAGYL) IVPB 500 mg        500 mg 100 mL/hr over 60 Minutes Intravenous Every 8 hours 12/15/19 2332     12/15/19 2345  vancomycin (VANCOCIN) IVPB 1000 mg/200 mL premix        1,000 mg 200 mL/hr over 60 Minutes Intravenous  Once 12/15/19 2332 12/16/19 0210   12/15/19 2200  ceFEPIme (MAXIPIME) 2 g in sodium chloride 0.9 % 100 mL IVPB        2 g 200 mL/hr over 30 Minutes Intravenous Every 8 hours 12/15/19 1504     12/15/19 1430   vancomycin (VANCOREADY) IVPB 2000 mg/400 mL        2,000 mg 200 mL/hr over 120 Minutes Intravenous  Once 12/15/19 1347 12/15/19 1635   12/15/19 1345  ceFEPIme (MAXIPIME) 2 g in sodium chloride 0.9 % 100 mL IVPB        2 g 200 mL/hr over 30 Minutes Intravenous  Once 12/15/19 1333 12/15/19 1424   12/15/19 1345  metroNIDAZOLE (FLAGYL) IVPB 500 mg        500 mg 100 mL/hr over 60 Minutes Intravenous  Once 12/15/19 1333 12/15/19 1452   12/15/19 1345  vancomycin (VANCOCIN) IVPB 1000 mg/200 mL premix  Status:  Discontinued        1,000 mg 200 mL/hr over 60 Minutes Intravenous  Once 12/15/19 1333 12/15/19 1347      Subjective: Patient was seen and examined at bedside.  Overnight events noted.  Patient is alert and oriented x3.   He denies any abdominal pain, nausea, vomiting, diarrhea.  Objective: Vitals:   12/16/19 0715 12/16/19 0800 12/16/19 0805 12/16/19 1100  BP: 94/63 108/64  (!) 107/49  Pulse:   88 99  Resp:      Temp:   98.8 F (37.1 C)   TempSrc:   Oral   SpO2:    95%  Weight:      Height:        Intake/Output Summary (Last 24 hours) at 12/16/2019 1156 Last data filed at 12/16/2019 0923 Gross per 24 hour  Intake 3600 ml  Output --  Net 3600 ml   Filed Weights   12/15/19 1300 12/15/19 1824  Weight: 91.6 kg 86.2 kg    Examination:  General exam: Appears calm and comfortable, Alert and oriented.  Respiratory system: Clear to auscultation. Respiratory effort normal. Cardiovascular system: S1 & S2 heard, RRR. No JVD, murmurs, rubs, gallops or clicks. No pedal edema. Gastrointestinal system: Abdomen is nondistended, soft and nontender. No organomegaly or masses felt.  Normal bowel sounds heard. Central nervous system: Alert and oriented. No focal neurological deficits. Extremities:  No leg edema, No cyanosis, no clubbing. Skin: No rashes, lesions or ulcers Psychiatry: Judgement and insight appear normal. Mood & affect appropriate.     Data Reviewed: I have personally  reviewed following labs and imaging studies  CBC: Recent Labs  Lab 12/15/19 1332 12/16/19 0351  WBC 9.2 6.1  NEUTROABS 7.1  --   HGB 9.7* 9.1*  HCT 32.3* 29.9*  MCV 91.0 90.3  PLT 246 559   Basic Metabolic Panel: Recent Labs  Lab 12/15/19 1332 12/16/19 0351  NA 132* 135  K 3.8 3.7  CL 97* 100  CO2 24 24  GLUCOSE 176* 150*  BUN 15 11  CREATININE 1.05 0.89  CALCIUM 8.3* 8.1*   GFR: Estimated Creatinine Clearance: 94.6 mL/min (by C-G  formula based on SCr of 0.89 mg/dL). Liver Function Tests: Recent Labs  Lab 12/15/19 1332 12/16/19 0351  AST 143* 132*  ALT 289* 307*  ALKPHOS 230* 202*  BILITOT 0.7 0.9  PROT 5.5* 5.3*  ALBUMIN 2.0* 1.9*   No results for input(s): LIPASE, AMYLASE in the last 168 hours. Recent Labs  Lab 12/15/19 1600  AMMONIA 25   Coagulation Profile: Recent Labs  Lab 12/15/19 1332 12/16/19 0351  INR 1.3* 1.3*   Cardiac Enzymes: No results for input(s): CKTOTAL, CKMB, CKMBINDEX, TROPONINI in the last 168 hours. BNP (last 3 results) No results for input(s): PROBNP in the last 8760 hours. HbA1C: No results for input(s): HGBA1C in the last 72 hours. CBG: No results for input(s): GLUCAP in the last 168 hours. Lipid Profile: No results for input(s): CHOL, HDL, LDLCALC, TRIG, CHOLHDL, LDLDIRECT in the last 72 hours. Thyroid Function Tests: No results for input(s): TSH, T4TOTAL, FREET4, T3FREE, THYROIDAB in the last 72 hours. Anemia Panel: No results for input(s): VITAMINB12, FOLATE, FERRITIN, TIBC, IRON, RETICCTPCT in the last 72 hours. Sepsis Labs: Recent Labs  Lab 12/15/19 1332 12/15/19 1600 12/16/19 0351  PROCALCITON  --   --  0.25  LATICACIDVEN 1.9 1.4  --     Recent Results (from the past 240 hour(s))  SARS Coronavirus 2 by RT PCR (hospital order, performed in [facility] hospital lab) Nasopharyngeal Nasopharyngeal Swab     Status: None   Collection Time: 12/15/19  2:09 PM   Specimen: Nasopharyngeal Swab  Result Value Ref  Range Status   SARS Coronavirus 2 NEGATIVE NEGATIVE Final    Comment: (NOTE) SARS-CoV-2 target nucleic acids are NOT DETECTED.  The SARS-CoV-2 RNA is generally detectable in upper and lower respiratory specimens during the acute phase of infection. The lowest concentration of SARS-CoV-2 viral copies this assay can detect is 250 copies / mL. A negative result does not preclude SARS-CoV-2 infection and should not be used as the sole basis for treatment or other patient management decisions.  A negative result may occur with improper specimen collection / handling, submission of specimen other than nasopharyngeal swab, presence of viral mutation(s) within the areas targeted by this assay, and inadequate number of viral copies (<250 copies / mL). A negative result must be combined with clinical observations, patient history, and epidemiological information.  Fact Sheet for Patients:   StrictlyIdeas.no  Fact Sheet for Healthcare Providers: BankingDealers.co.za  This test is not yet approved or  cleared by the [place] FDA and has been authorized for detection and/or diagnosis of SARS-CoV-2 by FDA under an Emergency Use Authorization (EUA).  This EUA will remain in effect (meaning this test can be used) for the duration of the COVID-19 declaration under Section 564(b)(1) of the Act, 21 U.S.C. section 360bbb-3(b)(1), unless the authorization is terminated or revoked sooner.  Performed at [facility], [place] [address] [place], [place] [address]      Radiology Studies: CT Head Wo Contrast  Result Date: 12/15/2019 CLINICAL DATA:  Mental status change EXAM: CT HEAD WITHOUT CONTRAST TECHNIQUE: Contiguous axial images were obtained from the base of the skull through the vertex without intravenous contrast. COMPARISON:  09/09/2019 head CT. FINDINGS: Brain: No acute infarct or intracranial hemorrhage. No mass lesion. No midline shift,  ventriculomegaly or extra-axial fluid collection. Vascular: No hyperdense vessel or unexpected calcification. Skull: Negative for fracture or focal lesion. Sinuses/Orbits: Normal orbits. Clear paranasal sinuses. No mastoid effusion. Other: None. IMPRESSION: No acute intracranial process. Electronically Signed  By: Primitivo Gauze M.D.   On: 12/15/2019 14:21   MR BRAIN WO CONTRAST  Result Date: 12/16/2019 CLINICAL DATA:  Provided history: Mental status change, unknown cause. Additional provided: Altered and afebrile, tachycardic. EXAM: MRI HEAD WITHOUT CONTRAST TECHNIQUE: Multiplanar, multiecho pulse sequences of the brain and surrounding structures were obtained without intravenous contrast. COMPARISON:  Noncontrast head CT 12/15/2019. FINDINGS: Brain: The patient was unable to tolerate the full examination. As a result, only axial and coronal diffusion-weighted sequences, an axial T1 weighted sequence and an axial T2 weighted sequence could be obtained. The sagittal T1 weighted and axial T2 weighted sequences are moderately motion degraded. Cerebral volume is normal for age. There is no acute infarct. Within described limitations, there is no evidence of an intracranial mass. No extra-axial fluid collection is identified. No midline shift. Vascular: Expected proximal arterial flow voids. Skull and upper cervical spine: Within described limitations, no focal marrow lesion is identified. Sinuses/Orbits: Visualized orbits show no acute finding. Mild ethmoid sinus mucosal thickening. Trace left mastoid effusion. IMPRESSION: Prematurely terminated and motion degraded examination as described. The diffusion-weighted imaging is of good quality. There is no evidence of acute infarct. Mild ethmoid sinus mucosal thickening. Trace left mastoid effusion. Electronically Signed   By: Kellie Simmering DO   On: 12/16/2019 10:29   DG Chest Port 1 View  Result Date: 12/15/2019 CLINICAL DATA:  Sepsis EXAM: PORTABLE CHEST 1  VIEW COMPARISON:  None. FINDINGS: The heart size and mediastinal contours are within normal limits. No focal airspace consolidation, pleural effusion, or pneumothorax. The visualized skeletal structures are unremarkable. IMPRESSION: No active disease. Electronically Signed   By: Davina Poke D.O.   On: 12/15/2019 14:03   US Abdomen Limited RUQ  Result Date: 12/15/2019 CLINICAL DATA:  Transaminitis EXAM: ULTRASOUND ABDOMEN LIMITED RIGHT UPPER QUADRANT COMPARISON:  None. FINDINGS: Gallbladder: No gallstones or wall thickening visualized. No sonographic Murphy sign noted by sonographer. Common bile duct: Diameter: 4 mm Liver: The liver is enlarged measuring 18.7 cm in the midclavicular line. There is diffuse increased liver echotexture consistent with hepatic steatosis. No focal liver abnormality. Portal vein is patent on color Doppler imaging with normal direction of blood flow towards the liver. Other: Right kidney is unremarkable measuring 12.4 cm. No free fluid. IMPRESSION: 1. Hepatomegaly, with diffuse hepatic steatosis. 2. Otherwise unremarkable exam. Electronically Signed   By: Randa Ngo M.D.   On: 12/15/2019 18:25   Scheduled Meds: Continuous Infusions: . ceFEPime (MAXIPIME) IV Stopped (12/16/19 3335)  . dextrose 5 % and 0.9% NaCl 100 mL/hr at 12/16/19 1046  . lactated ringers    . metronidazole Stopped (12/16/19 4562)  . vancomycin       LOS: 1 day    Time spent: 35 mins.    Shawna Clamp, MD [facility]   If 7PM-7AM, please contact night-coverage

## 2019-12-16 NOTE — ED Notes (Signed)
Pt in MRI.

## 2019-12-16 NOTE — ED Notes (Signed)
Pt taking off wires, took out PIV, dressing applied. Pt states he did not take off wires, wife at bedside states pt took leads off. Pt oriented to person and place, not oriented to time.

## 2019-12-16 NOTE — ED Notes (Signed)
Admit doctor at bedside discussing plan of care with patient. Report given to MRI.

## 2019-12-17 LAB — COMPREHENSIVE METABOLIC PANEL
ALT: 243 U/L — ABNORMAL HIGH (ref 0–44)
AST: 77 U/L — ABNORMAL HIGH (ref 15–41)
Albumin: 1.8 g/dL — ABNORMAL LOW (ref 3.5–5.0)
Alkaline Phosphatase: 179 U/L — ABNORMAL HIGH (ref 38–126)
Anion gap: 9 (ref 5–15)
BUN: 9 mg/dL (ref 6–20)
CO2: 24 mmol/L (ref 22–32)
Calcium: 7.9 mg/dL — ABNORMAL LOW (ref 8.9–10.3)
Chloride: 101 mmol/L (ref 98–111)
Creatinine, Ser: 0.79 mg/dL (ref 0.61–1.24)
GFR calc Af Amer: >60 mL/min (ref >60)
GFR calc non Af Amer: >60 mL/min (ref >60)
Glucose, Bld: 154 mg/dL — ABNORMAL HIGH (ref 70–99)
Potassium: 3.5 mmol/L (ref 3.5–5.1)
Sodium: 134 mmol/L — ABNORMAL LOW (ref 135–145)
Total Bilirubin: 0.3 mg/dL (ref 0.3–1.2)
Total Protein: 5.1 g/dL — ABNORMAL LOW (ref 6.5–8.1)

## 2019-12-17 LAB — PHOSPHORUS: Phosphorus: 3.3 mg/dL (ref 2.5–4.6)

## 2019-12-17 LAB — CBC
HCT: 29.1 % — ABNORMAL LOW (ref 39.0–52.0)
Hemoglobin: 8.9 g/dL — ABNORMAL LOW (ref 13.0–17.0)
MCH: 27.4 pg (ref 26.0–34.0)
MCHC: 30.6 g/dL (ref 30.0–36.0)
MCV: 89.5 fL (ref 80.0–100.0)
Platelets: 201 10*3/uL (ref 150–400)
RBC: 3.25 MIL/uL — ABNORMAL LOW (ref 4.22–5.81)
RDW: 14.6 % (ref 11.5–15.5)
WBC: 5.5 10*3/uL (ref 4.0–10.5)
nRBC: 0 % (ref 0.0–0.2)

## 2019-12-17 LAB — HCV RNA QUANT RFLX ULTRA OR GENOTYP
HCV RNA Qnt(log copy/mL): UNDETERMINED log10 IU/mL
HepC Qn: NOT DETECTED IU/mL

## 2019-12-17 LAB — MAGNESIUM: Magnesium: 2 mg/dL (ref 1.7–2.4)

## 2019-12-17 MED ORDER — PANTOPRAZOLE SODIUM 40 MG IV SOLR
40.0000 mg | INTRAVENOUS | Status: DC
  Administered 2019-12-17 – 2019-12-18 (×2): 40 mg via INTRAVENOUS
  Filled 2019-12-17 (×2): qty 40

## 2019-12-17 NOTE — Progress Notes (Signed)
New Admission Note:   Arrival Method: from ED via regular bed Mental Orientation: alert and oriented x2 Telemetry: 8m07,CCMD notified Assessment: to be completed Skin: Intact, warm and dry IV: LFA, infusing D5NS @100cc /hr Pain: 0/10 Tubes: None Safety Measures: Safety Fall Prevention Plan has been discussed  Admission: to be completed 5 [place] Orientation: Patient has been orientated to the room, unit and staff.   Family: none at bedside.   1:1 sitter for safety  Orders to be reviewed and implemented. Will continue to monitor the patient. Call light has been placed within reach and bed alarm has been activated.

## 2019-12-17 NOTE — Progress Notes (Signed)
Notified MD Dwyane Dee via secure chat of oral temp 101.32F.   1700 pt received PRN Tylenol per orders.   Paulla Fore, RN

## 2019-12-17 NOTE — Progress Notes (Signed)
Subjective: He thinks that he is feeling better.  No acute events overnight.  Objective: Vital signs in last 24 hours: Temp:  [98.2 F (36.8 C)-101.9 F (38.8 C)] 99.3 F (37.4 C) (09/24 0848) Pulse Rate:  [86-113] 100 (09/24 0848) Resp:  [16-36] 18 (09/24 0848) BP: (91-126)/(40-76) 118/73 (09/24 0848) SpO2:  [93 %-97 %] 96 % (09/24 0848) Weight:  [83.6 kg] 83.6 kg (09/24 0059) Last BM Date: 12/17/19  Intake/Output from previous day: 09/23 0701 - 09/24 0700 In: 3499.8 [P.O.:240; I.V.:2146.2; IV Piggyback:1113.6] Out: 1 [Urine:1] Intake/Output this shift: Total I/O In: 240 [P.O.:240] Out: -   General appearance: alert and no distress GI: soft, non-tender; bowel sounds normal; no masses,  no organomegaly  Lab Results: Recent Labs    12/15/19 1332 12/16/19 0351  WBC 9.2 6.1  HGB 9.7* 9.1*  HCT 32.3* 29.9*  PLT 246 204   BMET Recent Labs    12/15/19 1332 12/16/19 0351  NA 132* 135  K 3.8 3.7  CL 97* 100  CO2 24 24  GLUCOSE 176* 150*  BUN 15 11  CREATININE 1.05 0.89  CALCIUM 8.3* 8.1*   LFT Recent Labs    12/16/19 0351  PROT 5.3*  ALBUMIN 1.9*  AST 132*  ALT 307*  ALKPHOS 202*  BILITOT 0.9   PT/INR Recent Labs    12/15/19 1332 12/16/19 0351  LABPROT 15.4* 15.3*  INR 1.3* 1.3*   Hepatitis Panel Recent Labs    12/15/19 1630  HEPBSAG NON REACTIVE  HCVAB Reactive*  HEPAIGM NON REACTIVE  HEPBIGM NON REACTIVE   C-Diff No results for input(s): CDIFFTOX in the last 72 hours. Fecal Lactopherrin No results for input(s): FECLLACTOFRN in the last 72 hours.  Studies/Results: CT Head Wo Contrast  Result Date: 12/15/2019 CLINICAL DATA:  Mental status change EXAM: CT HEAD WITHOUT CONTRAST TECHNIQUE: Contiguous axial images were obtained from the base of the skull through the vertex without intravenous contrast. COMPARISON:  09/09/2019 head CT. FINDINGS: Brain: No acute infarct or intracranial hemorrhage. No mass lesion. No midline shift,  ventriculomegaly or extra-axial fluid collection. Vascular: No hyperdense vessel or unexpected calcification. Skull: Negative for fracture or focal lesion. Sinuses/Orbits: Normal orbits. Clear paranasal sinuses. No mastoid effusion. Other: None. IMPRESSION: No acute intracranial process. Electronically Signed   By: Primitivo Gauze M.D.   On: 12/15/2019 14:21   MR BRAIN WO CONTRAST  Result Date: 12/16/2019 CLINICAL DATA:  Provided history: Mental status change, unknown cause. Additional provided: Altered and afebrile, tachycardic. EXAM: MRI HEAD WITHOUT CONTRAST TECHNIQUE: Multiplanar, multiecho pulse sequences of the brain and surrounding structures were obtained without intravenous contrast. COMPARISON:  Noncontrast head CT 12/15/2019. FINDINGS: Brain: The patient was unable to tolerate the full examination. As a result, only axial and coronal diffusion-weighted sequences, an axial T1 weighted sequence and an axial T2 weighted sequence could be obtained. The sagittal T1 weighted and axial T2 weighted sequences are moderately motion degraded. Cerebral volume is normal for age. There is no acute infarct. Within described limitations, there is no evidence of an intracranial mass. No extra-axial fluid collection is identified. No midline shift. Vascular: Expected proximal arterial flow voids. Skull and upper cervical spine: Within described limitations, no focal marrow lesion is identified. Sinuses/Orbits: Visualized orbits show no acute finding. Mild ethmoid sinus mucosal thickening. Trace left mastoid effusion. IMPRESSION: Prematurely terminated and motion degraded examination as described. The diffusion-weighted imaging is of good quality. There is no evidence of acute infarct. Mild ethmoid sinus mucosal thickening. Trace left mastoid  effusion. Electronically Signed   By: Kellie Simmering DO   On: 12/16/2019 10:29   DG Chest Port 1 View  Result Date: 12/15/2019 CLINICAL DATA:  Sepsis EXAM: PORTABLE CHEST 1  VIEW COMPARISON:  None. FINDINGS: The heart size and mediastinal contours are within normal limits. No focal airspace consolidation, pleural effusion, or pneumothorax. The visualized skeletal structures are unremarkable. IMPRESSION: No active disease. Electronically Signed   By: Davina Poke D.O.   On: 12/15/2019 14:03   US Abdomen Limited RUQ  Result Date: 12/15/2019 CLINICAL DATA:  Transaminitis EXAM: ULTRASOUND ABDOMEN LIMITED RIGHT UPPER QUADRANT COMPARISON:  None. FINDINGS: Gallbladder: No gallstones or wall thickening visualized. No sonographic Murphy sign noted by sonographer. Common bile duct: Diameter: 4 mm Liver: The liver is enlarged measuring 18.7 cm in the midclavicular line. There is diffuse increased liver echotexture consistent with hepatic steatosis. No focal liver abnormality. Portal vein is patent on color Doppler imaging with normal direction of blood flow towards the liver. Other: Right kidney is unremarkable measuring 12.4 cm. No free fluid. IMPRESSION: 1. Hepatomegaly, with diffuse hepatic steatosis. 2. Otherwise unremarkable exam. Electronically Signed   By: Randa Ngo M.D.   On: 12/15/2019 18:25    Medications:  Scheduled:  Continuous: . ceFEPime (MAXIPIME) IV 2 g (12/17/19 0539)  . dextrose 5 % and 0.9% NaCl 100 mL/hr at 12/16/19 1932  . lactated ringers    . metronidazole 500 mg (12/17/19 0805)  . vancomycin 1,250 mg (12/16/19 2256)    Assessment/Plan: 1) Abnormal liver enzymes - presumed to be DILI. 2) AMS - improving.   The patient is stable.  His liver panel is pending for this AM.  Plan: 1) No new recommendations.  Continue with supportive care.  LOS: 2 days   Brian Ochoa D 12/17/2019, 9:56 AM

## 2019-12-17 NOTE — Progress Notes (Signed)
Pt to transfer to 3E07- waiting for room to be ready.

## 2019-12-17 NOTE — Progress Notes (Signed)
PROGRESS NOTE    Brian Ochoa  OIZ:[id number] DOB: 29-May-1960 DOA: 12/15/2019 PCP: Aletha Halim., PA-C   Brief Narrative:  59 years old male with medical history significant for prior history of TIA presented in the ER after his wife noted him to be confused and febrile.  Patient has been admitted for sepsis.  No obvious source was noted,  all the work-up done so far proved negative except positive for hepatitis C and elevated liver enzymes.  RUQ ultrasound showed no evidence of gallstones or obstructive uropathy.  Lumbar puncture was attempted but was not successful.  Urine drug screen is negative. GI was consulted, states this could drug induced liver injury ( DILI) sec to doxycycline. Advised supportive care. Given history of TIA,  MRI was completed which is unremarkable for acute infarct.  Assessment & Plan:   Principal Problem:   Sepsis ([place]) Active Problems:   Hepatitis C antibody positive in blood   Hyponatremia   Transaminitis   Sepsis could be due to acute hepatitis:  Patient met criteria for sepsis with a fever, increased heart rate as well as respiratory rate at admission.  No obvious source. Suspecting could be acute hepatitis C. Started on sepsis pathway with IV antibiotics and fluid resuscitation.  Initial lactic acid as indicated was 1.9. Blood cultures : No growth so far,  His positive hepatitis C and transaminitis could be part of the cause. LP attempted but not successful,  Patient has no meningeal symptoms at this point. We will continue supportive care and wait for culture results.  Continue vancomycin and cefepime for now. He spiked fever 101.9 yesterday  Hepatitis C positive:  Not clear if this is acute infection or chronic hep C.  Patient reports being treated in the past and completed treatment.  LFTs elevated AST 143, ALT 289, Patient has normal LFTs back in June. GI consulted. states this could drug induced liver injury ( DILI) sec. to doxycycline. Advised  supportive care.  Denies alcohol use denied any excessive Tylenol use or any hepatotoxic medications.    Hyponatremia: Resuscitated with some fluids.  Improved.  Elevated Liver Enzymes : Ultrasound abdomen shows hepatic steatosis.  Continue supportive care, recheck labs.  Altered mental status: >> Improving. In the setting of fever suspected intracranial cause including meningitis or encephalitis. Lumbar puncture attempted but unsuccessful.  Empirically started on antibiotics..  Patient however has recovered quickly.  MRI brain negative for any acute abnormality.  Patient is alert , oriented x 3, slightly confused.  DVT prophylaxis: Lovenox Code Status: Full code Family Communication: spoke with wife at bed side. Disposition Plan:Status is: Inpatient  Remains inpatient appropriate because:Inpatient level of care appropriate due to severity of illness   Dispo: The patient is from: Home              Anticipated d/c is to: Home              Anticipated d/c date is: 2 days              Patient currently is not medically stable to d/c.  Consultants:   Gastroenterology  Procedures: CT head, MRI brain, ultrasound abdomen. Antimicrobials: Anti-infectives (From admission, onward)   Start     Dose/Rate Route Frequency Ordered Stop   12/16/19 1000  vancomycin (VANCOREADY) IVPB 1250 mg/250 mL  Status:  Discontinued        1,250 mg 166.7 mL/hr over 90 Minutes Intravenous Every 12 hours 12/15/19 1504 12/17/19 1415   12/15/19 2345  ceFEPIme (MAXIPIME) 2 g in sodium chloride 0.9 % 100 mL IVPB  Status:  Discontinued        2 g 200 mL/hr over 30 Minutes Intravenous  Once 12/15/19 2332 12/15/19 2336   12/15/19 2345  metroNIDAZOLE (FLAGYL) IVPB 500 mg  Status:  Discontinued        500 mg 100 mL/hr over 60 Minutes Intravenous Every 8 hours 12/15/19 2332 12/17/19 1415   12/15/19 2345  vancomycin (VANCOCIN) IVPB 1000 mg/200 mL premix        1,000 mg 200 mL/hr over 60 Minutes Intravenous  Once  12/15/19 2332 12/16/19 0210   12/15/19 2200  ceFEPIme (MAXIPIME) 2 g in sodium chloride 0.9 % 100 mL IVPB  Status:  Discontinued        2 g 200 mL/hr over 30 Minutes Intravenous Every 8 hours 12/15/19 1504 12/17/19 1415   12/15/19 1430  vancomycin (VANCOREADY) IVPB 2000 mg/400 mL        2,000 mg 200 mL/hr over 120 Minutes Intravenous  Once 12/15/19 1347 12/15/19 1635   12/15/19 1345  ceFEPIme (MAXIPIME) 2 g in sodium chloride 0.9 % 100 mL IVPB        2 g 200 mL/hr over 30 Minutes Intravenous  Once 12/15/19 1333 12/15/19 1424   12/15/19 1345  metroNIDAZOLE (FLAGYL) IVPB 500 mg        500 mg 100 mL/hr over 60 Minutes Intravenous  Once 12/15/19 1333 12/15/19 1452   12/15/19 1345  vancomycin (VANCOCIN) IVPB 1000 mg/200 mL premix  Status:  Discontinued        1,000 mg 200 mL/hr over 60 Minutes Intravenous  Once 12/15/19 1333 12/15/19 1347      Subjective: Patient was seen and examined at bedside.Overnight events noted. Patient is alert and oriented x3.  He reports feeling better,  denies any other concerns.Wife at bed side, has questions which were answered.   Objective: Vitals:   12/17/19 0059 12/17/19 0450 12/17/19 0848 12/17/19 1414  BP:  116/74 118/73   Pulse:  100 100   Resp:  19 18   Temp:  98.2 F (36.8 C) 99.3 F (37.4 C) 99.9 F (37.7 C)  TempSrc:   Oral Oral  SpO2:  94% 96%   Weight: 83.6 kg     Height:        Intake/Output Summary (Last 24 hours) at 12/17/2019 1550 Last data filed at 12/17/2019 1300 Gross per 24 hour  Intake 2729.84 ml  Output 1 ml  Net 2728.84 ml   Filed Weights   12/15/19 1300 12/15/19 1824 12/17/19 0059  Weight: 91.6 kg 86.2 kg 83.6 kg    Examination:  General exam: Appears calm and comfortable, Alert and oriented x3  Respiratory system: Clear to auscultation. Respiratory effort normal. Cardiovascular system: S1 & S2 heard, RRR. No JVD, murmurs, rubs, gallops or clicks. No pedal edema. Gastrointestinal system: Abdomen is nondistended,  soft and nontender. No organomegaly or masses felt.  Normal bowel sounds heard. Central nervous system: Alert and oriented. No focal neurological deficits. Extremities:  No leg edema, No cyanosis, no clubbing. Skin: No rashes, lesions or ulcers Psychiatry: Judgement and insight appear normal. Mood & affect appropriate.     Data Reviewed: I have personally reviewed following labs and imaging studies  CBC: Recent Labs  Lab 12/15/19 1332 12/16/19 0351 12/17/19 0920  WBC 9.2 6.1 5.5  NEUTROABS 7.1  --   --   HGB 9.7* 9.1* 8.9*  HCT 32.3* 29.9* 29.1*  MCV  91.0 90.3 89.5  PLT 246 204 626   Basic Metabolic Panel: Recent Labs  Lab 12/15/19 1332 12/16/19 0351 12/17/19 0920  NA 132* 135 134*  K 3.8 3.7 3.5  CL 97* 100 101  CO2 24 24 24   GLUCOSE 176* 150* 154*  BUN 15 11 9   CREATININE 1.05 0.89 0.79  CALCIUM 8.3* 8.1* 7.9*  MG  --   --  2.0  PHOS  --   --  3.3   GFR: Estimated Creatinine Clearance: 105.2 mL/min (by C-G formula based on SCr of 0.79 mg/dL). Liver Function Tests: Recent Labs  Lab 12/15/19 1332 12/16/19 0351 12/17/19 0920  AST 143* 132* 77*  ALT 289* 307* 243*  ALKPHOS 230* 202* 179*  BILITOT 0.7 0.9 0.3  PROT 5.5* 5.3* 5.1*  ALBUMIN 2.0* 1.9* 1.8*   No results for input(s): LIPASE, AMYLASE in the last 168 hours. Recent Labs  Lab 12/15/19 1600  AMMONIA 25   Coagulation Profile: Recent Labs  Lab 12/15/19 1332 12/16/19 0351  INR 1.3* 1.3*   Cardiac Enzymes: No results for input(s): CKTOTAL, CKMB, CKMBINDEX, TROPONINI in the last 168 hours. BNP (last 3 results) No results for input(s): PROBNP in the last 8760 hours. HbA1C: No results for input(s): HGBA1C in the last 72 hours. CBG: No results for input(s): GLUCAP in the last 168 hours. Lipid Profile: No results for input(s): CHOL, HDL, LDLCALC, TRIG, CHOLHDL, LDLDIRECT in the last 72 hours. Thyroid Function Tests: No results for input(s): TSH, T4TOTAL, FREET4, T3FREE, THYROIDAB in the last  72 hours. Anemia Panel: No results for input(s): VITAMINB12, FOLATE, FERRITIN, TIBC, IRON, RETICCTPCT in the last 72 hours. Sepsis Labs: Recent Labs  Lab 12/15/19 1332 12/15/19 1600 12/16/19 0351  PROCALCITON  --   --  0.25  LATICACIDVEN 1.9 1.4  --     Recent Results (from the past 240 hour(s))  Blood Culture (routine x 2)     Status: None (Preliminary result)   Collection Time: 12/15/19  1:32 PM   Specimen: BLOOD  Result Value Ref Range Status   Specimen Description BLOOD LEFT ANTECUBITAL  Final   Special Requests   Final    BOTTLES DRAWN AEROBIC AND ANAEROBIC Blood Culture results may not be optimal due to an inadequate volume of blood received in culture bottles   Culture   Final    NO GROWTH 1 DAY Performed at [facility] [address] [place], [place] [address]    Report Status PENDING  Incomplete  Urine culture     Status: None   Collection Time: 12/15/19  1:32 PM   Specimen: In/Out Cath Urine  Result Value Ref Range Status   Specimen Description IN/OUT CATH URINE  Final   Special Requests NONE  Final   Culture   Final    NO GROWTH Performed at [facility], [place] [address], [place], [place] [address]    Report Status 12/16/2019 FINAL  Final  Blood Culture (routine x 2)     Status: None (Preliminary result)   Collection Time: 12/15/19  1:48 PM   Specimen: BLOOD RIGHT FOREARM  Result Value Ref Range Status   Specimen Description BLOOD RIGHT FOREARM  Final   Special Requests   Final    BOTTLES DRAWN AEROBIC AND ANAEROBIC Blood Culture results may not be optimal due to an inadequate volume of blood received in culture bottles   Culture   Final    NO GROWTH 1 DAY Performed at [facility], [place] [address] [place], [place] [address]    Report Status PENDING  Incomplete  SARS Coronavirus 2 by RT PCR (hospital order, performed in [facility] hospital lab) Nasopharyngeal Nasopharyngeal Swab     Status: None   Collection Time: 12/15/19  2:09 PM    Specimen: Nasopharyngeal Swab  Result Value Ref Range Status   SARS Coronavirus 2 NEGATIVE NEGATIVE Final    Comment: (NOTE) SARS-CoV-2 target nucleic acids are NOT DETECTED.  The SARS-CoV-2 RNA is generally detectable in upper and lower respiratory specimens during the acute phase of infection. The lowest concentration of SARS-CoV-2 viral copies this assay can detect is 250 copies / mL. A negative result does not preclude SARS-CoV-2 infection and should not be used as the sole basis for treatment or other patient management decisions.  A negative result may occur with improper specimen collection / handling, submission of specimen other than nasopharyngeal swab, presence of viral mutation(s) within the areas targeted by this assay, and inadequate number of viral copies (<250 copies / mL). A negative result must be combined with clinical observations, patient history, and epidemiological information.  Fact Sheet for Patients:   StrictlyIdeas.no  Fact Sheet for Healthcare Providers: BankingDealers.co.za  This test is not yet approved or  cleared by the [place] FDA and has been authorized for detection and/or diagnosis of SARS-CoV-2 by FDA under an Emergency Use Authorization (EUA).  This EUA will remain in effect (meaning this test can be used) for the duration of the COVID-19 declaration under Section 564(b)(1) of the Act, 21 U.S.C. section 360bbb-3(b)(1), unless the authorization is terminated or revoked sooner.  Performed at [facility], [place] [address] [place], [place] [address]      Radiology Studies: MR BRAIN WO CONTRAST  Result Date: 12/16/2019 CLINICAL DATA:  Provided history: Mental status change, unknown cause. Additional provided: Altered and afebrile, tachycardic. EXAM: MRI HEAD WITHOUT CONTRAST TECHNIQUE: Multiplanar, multiecho pulse sequences of the brain and surrounding structures were obtained without  intravenous contrast. COMPARISON:  Noncontrast head CT 12/15/2019. FINDINGS: Brain: The patient was unable to tolerate the full examination. As a result, only axial and coronal diffusion-weighted sequences, an axial T1 weighted sequence and an axial T2 weighted sequence could be obtained. The sagittal T1 weighted and axial T2 weighted sequences are moderately motion degraded. Cerebral volume is normal for age. There is no acute infarct. Within described limitations, there is no evidence of an intracranial mass. No extra-axial fluid collection is identified. No midline shift. Vascular: Expected proximal arterial flow voids. Skull and upper cervical spine: Within described limitations, no focal marrow lesion is identified. Sinuses/Orbits: Visualized orbits show no acute finding. Mild ethmoid sinus mucosal thickening. Trace left mastoid effusion. IMPRESSION: Prematurely terminated and motion degraded examination as described. The diffusion-weighted imaging is of good quality. There is no evidence of acute infarct. Mild ethmoid sinus mucosal thickening. Trace left mastoid effusion. Electronically Signed   By: Kellie Simmering DO   On: 12/16/2019 10:29   US Abdomen Limited RUQ  Result Date: 12/15/2019 CLINICAL DATA:  Transaminitis EXAM: ULTRASOUND ABDOMEN LIMITED RIGHT UPPER QUADRANT COMPARISON:  None. FINDINGS: Gallbladder: No gallstones or wall thickening visualized. No sonographic Murphy sign noted by sonographer. Common bile duct: Diameter: 4 mm Liver: The liver is enlarged measuring 18.7 cm in the midclavicular line. There is diffuse increased liver echotexture consistent with hepatic steatosis. No focal liver abnormality. Portal vein is patent on color Doppler imaging with normal direction of blood flow towards  the liver. Other: Right kidney is unremarkable measuring 12.4 cm. No free fluid. IMPRESSION: 1. Hepatomegaly, with diffuse hepatic steatosis. 2. Otherwise unremarkable exam. Electronically Signed   By:  Randa Ngo M.D.   On: 12/15/2019 18:25   Scheduled Meds: . [START ON 12/18/2019] pantoprazole (PROTONIX) IV  40 mg Intravenous Q24H   Continuous Infusions: . dextrose 5 % and 0.9% NaCl 100 mL/hr at 12/17/19 1257  . lactated ringers       LOS: 2 days    Time spent: 25 mins.    Shawna Clamp, MD [facility]   If 7PM-7AM, please contact night-coverage

## 2019-12-17 NOTE — Progress Notes (Signed)
Oral temp recheck 99.29F.

## 2019-12-18 LAB — CBC
HCT: 29 % — ABNORMAL LOW (ref 39.0–52.0)
Hemoglobin: 8.8 g/dL — ABNORMAL LOW (ref 13.0–17.0)
MCH: 27 pg (ref 26.0–34.0)
MCHC: 30.3 g/dL (ref 30.0–36.0)
MCV: 89 fL (ref 80.0–100.0)
Platelets: 196 10*3/uL (ref 150–400)
RBC: 3.26 MIL/uL — ABNORMAL LOW (ref 4.22–5.81)
RDW: 14.6 % (ref 11.5–15.5)
WBC: 5.1 10*3/uL (ref 4.0–10.5)
nRBC: 0 % (ref 0.0–0.2)

## 2019-12-18 LAB — COMPREHENSIVE METABOLIC PANEL
ALT: 184 U/L — ABNORMAL HIGH (ref 0–44)
AST: 55 U/L — ABNORMAL HIGH (ref 15–41)
Albumin: 1.7 g/dL — ABNORMAL LOW (ref 3.5–5.0)
Alkaline Phosphatase: 169 U/L — ABNORMAL HIGH (ref 38–126)
Anion gap: 11 (ref 5–15)
BUN: 7 mg/dL (ref 6–20)
CO2: 22 mmol/L (ref 22–32)
Calcium: 7.8 mg/dL — ABNORMAL LOW (ref 8.9–10.3)
Chloride: 102 mmol/L (ref 98–111)
Creatinine, Ser: 0.82 mg/dL (ref 0.61–1.24)
GFR calc Af Amer: >60 mL/min (ref >60)
GFR calc non Af Amer: >60 mL/min (ref >60)
Glucose, Bld: 162 mg/dL — ABNORMAL HIGH (ref 70–99)
Potassium: 3.4 mmol/L — ABNORMAL LOW (ref 3.5–5.1)
Sodium: 135 mmol/L (ref 135–145)
Total Bilirubin: 0.3 mg/dL (ref 0.3–1.2)
Total Protein: 4.9 g/dL — ABNORMAL LOW (ref 6.5–8.1)

## 2019-12-18 LAB — PHOSPHORUS: Phosphorus: 2.7 mg/dL (ref 2.5–4.6)

## 2019-12-18 LAB — MAGNESIUM: Magnesium: 1.9 mg/dL (ref 1.7–2.4)

## 2019-12-18 MED ORDER — POTASSIUM CHLORIDE 20 MEQ PO PACK
40.0000 meq | PACK | Freq: Once | ORAL | Status: AC
  Administered 2019-12-18: 40 meq via ORAL
  Filled 2019-12-18: qty 2

## 2019-12-18 MED ORDER — ENOXAPARIN SODIUM 40 MG/0.4ML ~~LOC~~ SOLN
40.0000 mg | SUBCUTANEOUS | Status: DC
  Administered 2019-12-18 – 2019-12-21 (×4): 40 mg via SUBCUTANEOUS
  Filled 2019-12-18 (×4): qty 0.4

## 2019-12-18 NOTE — Progress Notes (Signed)
PROGRESS NOTE    Brian Ochoa  EHM:[id number] DOB: 14-Feb-1961 DOA: 12/15/2019 PCP: Aletha Halim., PA-C   Brief Narrative:  59 years old male with medical history significant for prior history of TIA presented in the ER after his wife noted him to be confused and febrile.  Patient has been admitted for sepsis.  No obvious source was noted,  all the work-up done so far proved negative except positive for hepatitis C and elevated liver enzymes.  RUQ ultrasound showed no evidence of gallstones or obstructive uropathy.  Lumbar puncture was attempted but was not successful.  Urine drug screen is negative. GI was consulted, states this could drug induced liver injury ( DILI) sec to doxycycline. Advised supportive care. Given history of TIA,  MRI was completed which is unremarkable for acute infarct.  On 9/24 spiked fever 101.6, DC if afebrile for 24-48 hrs.  Assessment & Plan:   Principal Problem:   Sepsis ([place]) Active Problems:   Hepatitis C antibody positive in blood   Hyponatremia   Transaminitis   Sepsis could be due to acute hepatitis:  Patient met criteria for sepsis with a fever, increased heart rate as well as respiratory rate at admission.  No obvious source. Suspecting could be acute hepatitis C. Started on sepsis pathway with IV antibiotics and fluid resuscitation.  Initial lactic acid as indicated was 1.9. Blood cultures : No growth so far,  His positive hepatitis C and transaminitis could be part of the cause. LP attempted but not successful,  Patient has no meningeal symptoms at this point. We will continue supportive care, liver enzymes trended down.  started vancomycin and cefepime . Antibiotics discontinued  9/24.  He spiked fever 101.9  Later in the night. No other signs of infection noted.  Hepatitis C positive:  Not clear if this is acute infection or chronic hep C.  Patient reports being treated in the past and completed treatment.  LFTs elevated AST 143, ALT 289,  trended down,  Patient has normal LFTs back in June. GI consulted. states this could drug induced liver injury ( DILI) sec. to doxycycline. Advised supportive care.  Denies alcohol use denied any excessive Tylenol use or any hepatotoxic medications.   HCV RNA negative.  Hyponatremia: Resuscitated with some fluids.  Improved.  Elevated Liver Enzymes : Ultrasound abdomen shows hepatic steatosis.  Continue supportive care, LFTS trended down.  Altered mental status: >>improved In the setting of fever suspected intracranial cause including meningitis or encephalitis. Lumbar puncture attempted but unsuccessful.  Empirically started on antibiotics..  Patient however has recovered quickly.  MRI brain negative for any acute abnormality.  Patient is alert , oriented x 3, . ABx discontinued.  DVT prophylaxis: Lovenox Code Status: Full code Family Communication: spoke with wife at bed side. Disposition Plan:Status is: Inpatient  Remains inpatient appropriate because:Inpatient level of care appropriate due to severity of illness   Dispo: The patient is from: Home              Anticipated d/c is to: Home              Anticipated d/c date is: 9/26.              Patient currently is not medically stable to d/c.  Consultants:   Gastroenterology  Procedures: CT head, MRI brain, ultrasound abdomen. Antimicrobials: Anti-infectives (From admission, onward)   Start     Dose/Rate Route Frequency Ordered Stop   12/16/19 1000  vancomycin (VANCOREADY) IVPB 1250 mg/250  mL  Status:  Discontinued        1,250 mg 166.7 mL/hr over 90 Minutes Intravenous Every 12 hours 12/15/19 1504 12/17/19 1415   12/15/19 2345  ceFEPIme (MAXIPIME) 2 g in sodium chloride 0.9 % 100 mL IVPB  Status:  Discontinued        2 g 200 mL/hr over 30 Minutes Intravenous  Once 12/15/19 2332 12/15/19 2336   12/15/19 2345  metroNIDAZOLE (FLAGYL) IVPB 500 mg  Status:  Discontinued        500 mg 100 mL/hr over 60 Minutes Intravenous  Every 8 hours 12/15/19 2332 12/17/19 1415   12/15/19 2345  vancomycin (VANCOCIN) IVPB 1000 mg/200 mL premix        1,000 mg 200 mL/hr over 60 Minutes Intravenous  Once 12/15/19 2332 12/16/19 0210   12/15/19 2200  ceFEPIme (MAXIPIME) 2 g in sodium chloride 0.9 % 100 mL IVPB  Status:  Discontinued        2 g 200 mL/hr over 30 Minutes Intravenous Every 8 hours 12/15/19 1504 12/17/19 1415   12/15/19 1430  vancomycin (VANCOREADY) IVPB 2000 mg/400 mL        2,000 mg 200 mL/hr over 120 Minutes Intravenous  Once 12/15/19 1347 12/15/19 1635   12/15/19 1345  ceFEPIme (MAXIPIME) 2 g in sodium chloride 0.9 % 100 mL IVPB        2 g 200 mL/hr over 30 Minutes Intravenous  Once 12/15/19 1333 12/15/19 1424   12/15/19 1345  metroNIDAZOLE (FLAGYL) IVPB 500 mg        500 mg 100 mL/hr over 60 Minutes Intravenous  Once 12/15/19 1333 12/15/19 1452   12/15/19 1345  vancomycin (VANCOCIN) IVPB 1000 mg/200 mL premix  Status:  Discontinued        1,000 mg 200 mL/hr over 60 Minutes Intravenous  Once 12/15/19 1333 12/15/19 1347      Subjective: Patient was seen and examined at bedside. Overnight events noted.   He reports having headache but overall getting better, denies any neck pain.   He is Alert, oriented x 3, denies any Nausea, vomiting or diarrhea.  Objective: Vitals:   12/17/19 2050 12/18/19 0205 12/18/19 0423 12/18/19 0923  BP:  126/68 114/70 (!) 101/58  Pulse:  (!) 103 (!) 101 (!) 103  Resp:  16 16 20   Temp:  99.8 F (37.7 C) 99.9 F (37.7 C) 99.6 F (37.6 C)  TempSrc:  Oral Oral Oral  SpO2:  96% 94% 94%  Weight: 86.3 kg     Height:        Intake/Output Summary (Last 24 hours) at 12/18/2019 1511 Last data filed at 12/18/2019 1325 Gross per 24 hour  Intake 2438.78 ml  Output 1 ml  Net 2437.78 ml   Filed Weights   12/17/19 0059 12/17/19 2034 12/17/19 2050  Weight: 83.6 kg 86.3 kg 86.3 kg    Examination:  General exam: Appears calm and comfortable, Alert and oriented x3  Respiratory  system: Clear to auscultation. Respiratory effort normal. Cardiovascular system: S1 & S2 heard, RRR. No JVD, murmurs, rubs, gallops or clicks. No pedal edema. Gastrointestinal system: Abdomen is nondistended, soft and nontender. No organomegaly or masses felt.  Normal bowel sounds heard. Central nervous system: Alert and oriented. No focal neurological deficits. No neck stiffness, no kernig's sign. Extremities:  No leg edema, No cyanosis, no clubbing. Skin: No rashes, lesions or ulcers Psychiatry: Judgement and insight appear normal. Mood & affect appropriate.     Data Reviewed: I  have personally reviewed following labs and imaging studies  CBC: Recent Labs  Lab 12/15/19 1332 12/16/19 0351 12/17/19 0920 12/18/19 0811  WBC 9.2 6.1 5.5 5.1  NEUTROABS 7.1  --   --   --   HGB 9.7* 9.1* 8.9* 8.8*  HCT 32.3* 29.9* 29.1* 29.0*  MCV 91.0 90.3 89.5 89.0  PLT 246 204 201 794   Basic Metabolic Panel: Recent Labs  Lab 12/15/19 1332 12/16/19 0351 12/17/19 0920 12/18/19 0811  NA 132* 135 134* 135  K 3.8 3.7 3.5 3.4*  CL 97* 100 101 102  CO2 24 24 24 22   GLUCOSE 176* 150* 154* 162*  BUN 15 11 9 7   CREATININE 1.05 0.89 0.79 0.82  CALCIUM 8.3* 8.1* 7.9* 7.8*  MG  --   --  2.0 1.9  PHOS  --   --  3.3 2.7   GFR: Estimated Creatinine Clearance: 102.6 mL/min (by C-G formula based on SCr of 0.82 mg/dL). Liver Function Tests: Recent Labs  Lab 12/15/19 1332 12/16/19 0351 12/17/19 0920 12/18/19 0811  AST 143* 132* 77* 55*  ALT 289* 307* 243* 184*  ALKPHOS 230* 202* 179* 169*  BILITOT 0.7 0.9 0.3 0.3  PROT 5.5* 5.3* 5.1* 4.9*  ALBUMIN 2.0* 1.9* 1.8* 1.7*   No results for input(s): LIPASE, AMYLASE in the last 168 hours. Recent Labs  Lab 12/15/19 1600  AMMONIA 25   Coagulation Profile: Recent Labs  Lab 12/15/19 1332 12/16/19 0351  INR 1.3* 1.3*   Cardiac Enzymes: No results for input(s): CKTOTAL, CKMB, CKMBINDEX, TROPONINI in the last 168 hours. BNP (last 3  results) No results for input(s): PROBNP in the last 8760 hours. HbA1C: No results for input(s): HGBA1C in the last 72 hours. CBG: No results for input(s): GLUCAP in the last 168 hours. Lipid Profile: No results for input(s): CHOL, HDL, LDLCALC, TRIG, CHOLHDL, LDLDIRECT in the last 72 hours. Thyroid Function Tests: No results for input(s): TSH, T4TOTAL, FREET4, T3FREE, THYROIDAB in the last 72 hours. Anemia Panel: No results for input(s): VITAMINB12, FOLATE, FERRITIN, TIBC, IRON, RETICCTPCT in the last 72 hours. Sepsis Labs: Recent Labs  Lab 12/15/19 1332 12/15/19 1600 12/16/19 0351  PROCALCITON  --   --  0.25  LATICACIDVEN 1.9 1.4  --     Recent Results (from the past 240 hour(s))  Blood Culture (routine x 2)     Status: None (Preliminary result)   Collection Time: 12/15/19  1:32 PM   Specimen: BLOOD  Result Value Ref Range Status   Specimen Description BLOOD LEFT ANTECUBITAL  Final   Special Requests   Final    BOTTLES DRAWN AEROBIC AND ANAEROBIC Blood Culture results may not be optimal due to an inadequate volume of blood received in culture bottles   Culture   Final    NO GROWTH 3 DAYS Performed at [facility], [place] [address], [place], [place] [address]    Report Status PENDING  Incomplete  Urine culture     Status: None   Collection Time: 12/15/19  1:32 PM   Specimen: In/Out Cath Urine  Result Value Ref Range Status   Specimen Description IN/OUT CATH URINE  Final   Special Requests NONE  Final   Culture   Final    NO GROWTH Performed at [facility], [place] [address] [place], [place] [address]    Report Status 12/16/2019 FINAL  Final  Blood Culture (routine x 2)     Status: None (Preliminary result)  Collection Time: 12/15/19  1:48 PM   Specimen: BLOOD RIGHT FOREARM  Result Value Ref Range Status   Specimen Description BLOOD RIGHT FOREARM  Final   Special Requests   Final    BOTTLES DRAWN AEROBIC AND ANAEROBIC Blood Culture results may not be  optimal due to an inadequate volume of blood received in culture bottles   Culture   Final    NO GROWTH 3 DAYS Performed at [facility], [place] [address] [place], [place] [address]    Report Status PENDING  Incomplete  SARS Coronavirus 2 by RT PCR (hospital order, performed in [facility] hospital lab) Nasopharyngeal Nasopharyngeal Swab     Status: None   Collection Time: 12/15/19  2:09 PM   Specimen: Nasopharyngeal Swab  Result Value Ref Range Status   SARS Coronavirus 2 NEGATIVE NEGATIVE Final    Comment: (NOTE) SARS-CoV-2 target nucleic acids are NOT DETECTED.  The SARS-CoV-2 RNA is generally detectable in upper and lower respiratory specimens during the acute phase of infection. The lowest concentration of SARS-CoV-2 viral copies this assay can detect is 250 copies / mL. A negative result does not preclude SARS-CoV-2 infection and should not be used as the sole basis for treatment or other patient management decisions.  A negative result may occur with improper specimen collection / handling, submission of specimen other than nasopharyngeal swab, presence of viral mutation(s) within the areas targeted by this assay, and inadequate number of viral copies (<250 copies / mL). A negative result must be combined with clinical observations, patient history, and epidemiological information.  Fact Sheet for Patients:   StrictlyIdeas.no  Fact Sheet for Healthcare Providers: BankingDealers.co.za  This test is not yet approved or  cleared by the [place] FDA and has been authorized for detection and/or diagnosis of SARS-CoV-2 by FDA under an Emergency Use Authorization (EUA).  This EUA will remain in effect (meaning this test can be used) for the duration of the COVID-19 declaration under Section 564(b)(1) of the Act, 21 U.S.C. section 360bbb-3(b)(1), unless the authorization is terminated or revoked sooner.  Performed at [facility], [place] [address], [place], [place] [address]      Radiology Studies: No results found. Scheduled Meds: . pantoprazole (PROTONIX) IV  40 mg Intravenous Q24H   Continuous Infusions: . dextrose 5 % and 0.9% NaCl 100 mL/hr at 12/18/19 0940  . lactated ringers       LOS: 3 days    Time spent: 25 mins.    Shawna Clamp, MD [facility]   If 7PM-7AM, please contact night-coverage

## 2019-12-18 NOTE — Progress Notes (Signed)
Subjective: Feels well, but not at his baseline.  He has a persistent headache.  Objective: Vital signs in last 24 hours: Temp:  [98.1 F (36.7 C)-101.6 F (38.7 C)] 99.6 F (37.6 C) (09/25 0923) Pulse Rate:  [79-103] 103 (09/25 0923) Resp:  [16-20] 20 (09/25 0923) BP: (101-126)/(58-70) 101/58 (09/25 0923) SpO2:  [93 %-96 %] 94 % (09/25 0923) Weight:  [86.3 kg] 86.3 kg (09/24 2050) Last BM Date: 12/17/19  Intake/Output from previous day: 09/24 0701 - 09/25 0700 In: 2119.4 [P.O.:920; I.V.:1199.4] Out: 0  Intake/Output this shift: No intake/output data recorded.  General appearance: alert and no distress GI: soft, non-tender; bowel sounds normal; no masses,  no organomegaly  Lab Results: Recent Labs    12/16/19 0351 12/17/19 0920 12/18/19 0811  WBC 6.1 5.5 5.1  HGB 9.1* 8.9* 8.8*  HCT 29.9* 29.1* 29.0*  PLT 204 201 196   BMET Recent Labs    12/16/19 0351 12/17/19 0920 12/18/19 0811  NA 135 134* 135  K 3.7 3.5 3.4*  CL 100 101 102  CO2 24 24 22   GLUCOSE 150* 154* 162*  BUN 11 9 7   CREATININE 0.89 0.79 0.82  CALCIUM 8.1* 7.9* 7.8*   LFT Recent Labs    12/18/19 0811  PROT 4.9*  ALBUMIN 1.7*  AST 55*  ALT 184*  ALKPHOS 169*  BILITOT 0.3   PT/INR Recent Labs    12/15/19 1332 12/16/19 0351  LABPROT 15.4* 15.3*  INR 1.3* 1.3*   Hepatitis Panel Recent Labs    12/15/19 1630  HEPBSAG NON REACTIVE  HCVAB Reactive*  HEPAIGM NON REACTIVE  HEPBIGM NON REACTIVE   C-Diff No results for input(s): CDIFFTOX in the last 72 hours. Fecal Lactopherrin No results for input(s): FECLLACTOFRN in the last 72 hours.  Studies/Results: No results found.  Medications:  Scheduled: . pantoprazole (PROTONIX) IV  40 mg Intravenous Q24H   Continuous: . dextrose 5 % and 0.9% NaCl 100 mL/hr at 12/18/19 0940  . lactated ringers      Assessment/Plan: 1) DILI. 2) Headache. 3) Fever.   The patient's liver enzymes continue to improve.  The anticipation is that  the values will normalize.  His HCV RNA is negative and he DOES NOT have HCV.  This issue was resolved with his treatment in 2008/2009.  It appears that he has DILI associated with doxycycline.  Headaches remain a problem for him.  Plan: 1) No new GI recommendations. 2) Continue with supportive care. 3) Follow up 2 weeks post discharge.  LOS: 3 days   Lina Hitch D 12/18/2019, 9:58 AM

## 2019-12-19 DIAGNOSIS — R7401 Elevation of levels of liver transaminase levels: Secondary | ICD-10-CM

## 2019-12-19 DIAGNOSIS — R509 Fever, unspecified: Secondary | ICD-10-CM

## 2019-12-19 DIAGNOSIS — R768 Other specified abnormal immunological findings in serum: Secondary | ICD-10-CM

## 2019-12-19 LAB — COMPREHENSIVE METABOLIC PANEL
ALT: 176 U/L — ABNORMAL HIGH (ref 0–44)
AST: 71 U/L — ABNORMAL HIGH (ref 15–41)
Albumin: 1.7 g/dL — ABNORMAL LOW (ref 3.5–5.0)
Alkaline Phosphatase: 172 U/L — ABNORMAL HIGH (ref 38–126)
Anion gap: 5 (ref 5–15)
BUN: 7 mg/dL (ref 6–20)
CO2: 25 mmol/L (ref 22–32)
Calcium: 7.7 mg/dL — ABNORMAL LOW (ref 8.9–10.3)
Chloride: 104 mmol/L (ref 98–111)
Creatinine, Ser: 0.74 mg/dL (ref 0.61–1.24)
GFR calc Af Amer: >60 mL/min (ref >60)
GFR calc non Af Amer: >60 mL/min (ref >60)
Glucose, Bld: 150 mg/dL — ABNORMAL HIGH (ref 70–99)
Potassium: 3.5 mmol/L (ref 3.5–5.1)
Sodium: 134 mmol/L — ABNORMAL LOW (ref 135–145)
Total Bilirubin: 0.2 mg/dL — ABNORMAL LOW (ref 0.3–1.2)
Total Protein: 5.1 g/dL — ABNORMAL LOW (ref 6.5–8.1)

## 2019-12-19 LAB — CBC
HCT: 29.3 % — ABNORMAL LOW (ref 39.0–52.0)
Hemoglobin: 8.9 g/dL — ABNORMAL LOW (ref 13.0–17.0)
MCH: 27 pg (ref 26.0–34.0)
MCHC: 30.4 g/dL (ref 30.0–36.0)
MCV: 88.8 fL (ref 80.0–100.0)
Platelets: 195 10*3/uL (ref 150–400)
RBC: 3.3 MIL/uL — ABNORMAL LOW (ref 4.22–5.81)
RDW: 14.6 % (ref 11.5–15.5)
WBC: 5 10*3/uL (ref 4.0–10.5)
nRBC: 0 % (ref 0.0–0.2)

## 2019-12-19 LAB — PHOSPHORUS: Phosphorus: 2.4 mg/dL — ABNORMAL LOW (ref 2.5–4.6)

## 2019-12-19 LAB — MAGNESIUM: Magnesium: 2 mg/dL (ref 1.7–2.4)

## 2019-12-19 MED ORDER — PANTOPRAZOLE SODIUM 40 MG PO TBEC
40.0000 mg | DELAYED_RELEASE_TABLET | ORAL | Status: DC
  Administered 2019-12-19 – 2019-12-21 (×3): 40 mg via ORAL
  Filled 2019-12-19 (×3): qty 1

## 2019-12-19 NOTE — Progress Notes (Signed)
Patient noncompliant with wearing telemetry monitor, patient has been educated and MD has been notified. RN will continue to monitor this patient

## 2019-12-19 NOTE — Consult Note (Signed)
[facility]                                                                                        Patient Identification: Patient Name: Brian Ochoa MRN: [id number] [place] Date: 12/15/2019 12:48 PMToday's Date: 12/19/2019 Reason for consult: Fever and abnormal LFTs   Principal Problem:   Sepsis ([place]) Active Problems:   Hepatitis C antibody positive in blood   Hyponatremia   Transaminitis   Antibiotics: None   Assessment 1. Transminitis - LFTs were normal in 6/17. He says that he is a regular drinker to me and drinks " Three 12 pack beers in a week" but has not been able to consume as such since he started feeling sick appropx 3-4 weeks ago. Alcoholic hepatitis would have caused AST >ALT which is not consistent with his presentation where ALT>AST. US showed hepatomegaly with diffuse hepatis steatosis. ? Alcoholic hepatitis. He does have a h/o HCV treated in the past with SVR. HCV RNA this admission is negative. Hence, acute Hepatitis C is unlikely. Similarly, his acute hepatitis panel is negative including his HIV antibody. DILI with Doxycycline is a potential etiology given the timeline of events. AST/ALT are somewhat stable/downtrending now   2. Fevers  UA clean, Urine cx and Blood cx NGTD. CT head/MRI brain and Chest xray negative for acute findings. Patient denies any complains currently. Fever curve is downtrending. No leukocytosis. No diarrhea. No signs of phlebitis. Drug fever is less likely. procalitonin is 0.25  3. H/o Hepatitis C - treated  - HCV RNA 9/23 negative  Recommendations  No source of infection identified so far Would monitor fever curve off antibiotics unless patient develops signs of sepsis or decompensates Look for non infectious causes of fever Following   Dr Baxter Flattery will see tomorrow  Rosiland Oz, MD [facility]   To contact the attending provider between 8A-5P or the covering provider during after hours 5P-8A, please log into the web site [email or web address] and access using universal [place] password for that web site. If you do not have the password, please call the hospital operator. __________________________________________________________________________________________________________ HPI and Hospital Course: 59 Year old male with a PMH as below who presented to the ED on 9/22 after the wife noted him to be altered and febrile. Last known normal few hours earlier. Found to be septic on arrival to the ED. Patient says he had some URI symptoms 3 weeks ago for which he was given Doxycyline and steroids by his PCP. LP was attempted in the ER but failed. Patient later was more awake and alert and LP was deemed not necessary. Started on broad spectrum antibiotics for concerns of sepsis. All infective work-up so far proven negative except some elevation in transaminases.  Drug screen was also negative. Patient has a h/o hepatitis C in the past that was treated in the past with SVR. His HCV RNA currently is negative. LFT abnormalities were though to be DILI 2/2 Doxy use.  Imaging of his head did not reveal any acute abnormalities.  The RUQ U/S today showed hepatomegaly and steatosis. UA  clean, urine and blood cx negative.    ROS: General- FEVER+, Denies chills, loss of appetite and loss of weight HEENT - denies headache, blurry vision, neck pain Chest - COUGH WITH MINIMAL WHITISH PHLEGM, denies any chest pain, SOB CVS- Denies any dizziness, palpitations Abdomen- denies any nausea, vomiting, abdominal pain and diarrhea Neuro - Denies any weakness, numbness  Psych - denies any changes in mood irritability or depressive symptoms GU- Denies any burning, dysuria or increased frequency of urination Skin - denies any rashes MSK - denies any joint pain/swelling or ROM   Past Medical History:  Diagnosis  Date  . TIA (transient ischemic attack)     Scheduled Meds: . enoxaparin (LOVENOX) injection  40 mg Subcutaneous Q24H  . pantoprazole  40 mg Oral Q24H   Continuous Infusions: . dextrose 5 % and 0.9% NaCl 100 mL/hr at 12/18/19 2030  . lactated ringers     PRN Meds:.acetaminophen, albuterol, benzonatate, ondansetron **OR** ondansetron (ZOFRAN) IV  No Known Allergies  Social History   Socioeconomic History  . Marital status: Married    Spouse name: Not on file  . Number of children: Not on file  . Years of education: Not on file  . Highest education level: Not on file  Occupational History  . Not on file  Tobacco Use  . Smoking status: Not on file  Substance and Sexual Activity  . Alcohol use: Not on file  . Drug use: Not on file  . Sexual activity: Not on file  Other Topics Concern  . Not on file  Social History Narrative  . Not on file   Social Determinants of Health   Financial Resource Strain:   . Difficulty of Paying Living Expenses: Not on file  Food Insecurity:   . Worried About Charity fundraiser in the Last Year: Not on file  . Ran Out of Food in the Last Year: Not on file  Transportation Needs:   . Lack of Transportation (Medical): Not on file  . Lack of Transportation (Non-Medical): Not on file  Physical Activity:   . Days of Exercise per Week: Not on file  . Minutes of Exercise per Session: Not on file  Stress:   . Feeling of Stress : Not on file  Social Connections:   . Frequency of Communication with Friends and Family: Not on file  . Frequency of Social Gatherings with Friends and Family: Not on file  . Attends Religious Services: Not on file  . Active Member of Clubs or Organizations: Not on file  . Attends Archivist Meetings: Not on file  . Marital Status: Not on file  Intimate Partner Violence:   . Fear of Current or Ex-Partner: Not on file  . Emotionally Abused: Not on file  . Physically Abused: Not on file  . Sexually Abused:  Not on file    Vitals BP 116/71 (BP Location: Right Arm)   Pulse 99   Temp 98.9 F (37.2 C) (Oral)   Resp 18   Ht 5\' 10"  (1.778 m)   Wt 84.1 kg   SpO2 97%   BMI 26.60 kg/m      Examination  Gen: Alert and oriented x 3, not in acute distress HEENT: [place]/AT, PERLA, EOMI, no scleral icterus, no pale conjunctivae, hearing normal, mucosa moist Neck: Supple, no lymphadenopathy Cardio: RRR, +S1 and S Resp: CTAB; no wheezes, rhonchi, or rales GI: Soft, nontender, nondistended, bowel sounds + GU: Foley in place Musc: No joint swelling  or tenderness Extremities: No cyanosis, clubbing, or edema; palpable PT and DP pulses Skin: No rashes, lesions, or ecchymoses Neuro: grossly nonfocal Psych: Calm, cooperative  LINES/TUBES:  METAL IMPLANT/HARDWARE:  Pertinent Microbiology -  Current Antibiotics/Antivirals/Antifungals -  Pertinent Lab seen by me: CBC Latest Ref Rng & Units 12/19/2019 12/18/2019 12/17/2019  WBC 4.0 - 10.5 K/uL 5.0 5.1 5.5  Hemoglobin 13.0 - 17.0 g/dL 8.9(L) 8.8(L) 8.9(L)  Hematocrit 39 - 52 % 29.3(L) 29.0(L) 29.1(L)  Platelets 150 - 400 K/uL 195 196 201   CMP Latest Ref Rng & Units 12/19/2019 12/18/2019 12/17/2019  Glucose 70 - 99 mg/dL 150(H) 162(H) 154(H)  BUN 6 - 20 mg/dL 7 7 9   Creatinine 0.61 - 1.24 mg/dL 0.74 0.82 0.79  Sodium 135 - 145 mmol/L 134(L) 135 134(L)  Potassium 3.5 - 5.1 mmol/L 3.5 3.4(L) 3.5  Chloride 98 - 111 mmol/L 104 102 101  CO2 22 - 32 mmol/L 25 22 24   Calcium 8.9 - 10.3 mg/dL 7.7(L) 7.8(L) 7.9(L)  Total Protein 6.5 - 8.1 g/dL 5.1(L) 4.9(L) 5.1(L)  Total Bilirubin 0.3 - 1.2 mg/dL 0.2(L) 0.3 0.3  Alkaline Phos 38 - 126 U/L 172(H) 169(H) 179(H)  AST 15 - 41 U/L 71(H) 55(H) 77(H)  ALT 0 - 44 U/L 176(H) 184(H) 243(H)     Pertinent Imagings/Other Imagings Plain films and CT images have been personally visualized and interpreted; radiology reports have been reviewed. Decision making incorporated into the Impression / Recommendations.  [place]  RUQ 12/15/19  FINDINGS: Gallbladder:  No gallstones or wall thickening visualized. No sonographic Murphy sign noted by sonographer.  Common bile duct:  Diameter: 4 mm  Liver:  The liver is enlarged measuring 18.7 cm in the midclavicular line. There is diffuse increased liver echotexture consistent with hepatic steatosis. No focal liver abnormality. Portal vein is patent on color Doppler imaging with normal direction of blood flow towards the liver.  Other: Right kidney is unremarkable measuring 12.4 cm. No free fluid.  IMPRESSION: 1. Hepatomegaly, with diffuse hepatic steatosis. 2. Otherwise unremarkable exam.  CT Head 12/15/19 EXAM: CT HEAD WITHOUT CONTRAST  TECHNIQUE: Contiguous axial images were obtained from the base of the skull through the vertex without intravenous contrast.  COMPARISON:  09/09/2019 head CT.  FINDINGS: Brain: No acute infarct or intracranial hemorrhage. No mass lesion. No midline shift, ventriculomegaly or extra-axial fluid collection.  Vascular: No hyperdense vessel or unexpected calcification.  Skull: Negative for fracture or focal lesion.  Sinuses/Orbits: Normal orbits. Clear paranasal sinuses. No mastoid effusion.  Other: None.  IMPRESSION: No acute intracranial process.

## 2019-12-19 NOTE — Progress Notes (Signed)
PROGRESS NOTE    Brian Ochoa  PPJ:[id number] DOB: November 25, 1960 DOA: 12/15/2019 PCP: Aletha Halim., PA-C   Brief Narrative:  59 years old male with medical history significant for prior history of TIA presented in the ER after his wife noted him to be confused and febrile.  Patient has been admitted for sepsis.  No obvious source was noted,  all the work-up done so far proved negative except positive for hepatitis C and elevated liver enzymes.  RUQ ultrasound showed no evidence of gallstones or obstructive uropathy.  Lumbar puncture was attempted but was not successful.  Urine drug screen is negative. GI was consulted, states this could drug induced liver injury ( DILI) sec to doxycycline. Advised supportive care. Given history of TIA,  MRI was completed which is unremarkable for acute infarct.  Recurrent fevers.    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 Treated In the past.  Transaminitis -Currently no clear source of infection. -UA-negative. Chest x-ray-negative. Blood cultures-no growth to date. COVID-19-negative -Procalcitonin 0.25 -Unable to successfully perform LP. No meningeal signs -MRI-poor study but overall negative -GI team following. Supportive care monitor liver enzymes. -Right upper quadrant ultrasound-hepatomegaly and steatosis -ID consulted for their input regarding recurrent fevers  Altered mental status:Resolved MRI brain is negative for any acute pathology. Currently mentation is back to baseline..  Daily alcohol use -No signs of withdrawal. Closely monitor   DVT prophylaxis: Lovenox Code Status: Full code Family Communication:  Wife is present at the bedside  Status is: Inpatient  Remains inpatient appropriate because:Unsafe d/c plan   Dispo: The patient is  from: Home              Anticipated d/c is to: Home              Anticipated d/c date is: 2 days              Patient currently is not medically stable to d/c. Persistent fevers, ongoing monitoring and work-up   Body mass index is 26.6 kg/m.   Subjective: Feels okay no complaints besides occasional frontal headache but denies any shortness of breath.  Review of Systems Otherwise negative except as per HPI, including: General: Denies fever, chills, night sweats or unintended weight loss. Resp: Denies cough, wheezing, shortness of breath. Cardiac: Denies chest pain, palpitations, orthopnea, paroxysmal nocturnal dyspnea. GI: Denies abdominal pain, nausea, vomiting, diarrhea or constipation GU: Denies dysuria, frequency, hesitancy or incontinence MS: Denies muscle aches, joint pain or swelling Neuro: Denies headache, neurologic deficits (focal weakness, numbness, tingling), abnormal gait Psych: Denies anxiety, depression, SI/HI/AVH Skin: Denies new rashes or lesions ID: Denies sick contacts, exotic exposures, travel  Examination:  General exam: Appears calm and comfortable  Respiratory system: Clear to auscultation. Respiratory effort normal. Cardiovascular system: S1 & S2 heard, RRR. No JVD, murmurs, rubs, gallops or clicks. No pedal edema. Gastrointestinal system: Abdomen is nondistended, soft and nontender. No organomegaly or masses felt. Normal bowel sounds heard. Central nervous system: Alert and oriented. No focal neurological deficits. Extremities: Symmetric 5 x 5 power. Skin: No rashes, lesions or ulcers Psychiatry: Judgement and insight appear normal. Mood & affect appropriate.     Objective: Vitals:   12/18/19 2050 12/19/19 0154 12/19/19 0436 12/19/19 0926  BP: 106/64  110/73 116/71  Pulse: 82  97 99  Resp: 18  18 18   Temp: 100.3 F (37.9 C)  98.6 F (37 C) 98.9 F (37.2 C)  TempSrc: Oral  Oral Oral  SpO2: 95%  93% 97%  Weight:  84.1 kg    Height:         Intake/Output Summary (Last 24 hours) at 12/19/2019 0935 Last data filed at 12/19/2019 0900 Gross per 24 hour  Intake 3351.32 ml  Output 1 ml  Net 3350.32 ml   Filed Weights   12/17/19 2034 12/17/19 2050 12/19/19 0154  Weight: 86.3 kg 86.3 kg 84.1 kg     Data Reviewed:   CBC: Recent Labs  Lab 12/15/19 1332 12/16/19 0351 12/17/19 0920 12/18/19 0811 12/19/19 0412  WBC 9.2 6.1 5.5 5.1 5.0  NEUTROABS 7.1  --   --   --   --   HGB 9.7* 9.1* 8.9* 8.8* 8.9*  HCT 32.3* 29.9* 29.1* 29.0* 29.3*  MCV 91.0 90.3 89.5 89.0 88.8  PLT 246 204 201 196 409   Basic Metabolic Panel: Recent Labs  Lab 12/15/19 1332 12/16/19 0351 12/17/19 0920 12/18/19 0811 12/19/19 0412  NA 132* 135 134* 135 134*  K 3.8 3.7 3.5 3.4* 3.5  CL 97* 100 101 102 104  CO2 24 24 24 22 25   GLUCOSE 176* 150* 154* 162* 150*  BUN 15 11 9 7 7   CREATININE 1.05 0.89 0.79 0.82 0.74  CALCIUM 8.3* 8.1* 7.9* 7.8* 7.7*  MG  --   --  2.0 1.9 2.0  PHOS  --   --  3.3 2.7 2.4*   GFR: Estimated Creatinine Clearance: 105.2 mL/min (by C-G formula based on SCr of 0.74 mg/dL). Liver Function Tests: Recent Labs  Lab 12/15/19 1332 12/16/19 0351 12/17/19 0920 12/18/19 0811 12/19/19 0412  AST 143* 132* 77* 55* 71*  ALT 289* 307* 243* 184* 176*  ALKPHOS 230* 202* 179* 169* 172*  BILITOT 0.7 0.9 0.3 0.3 0.2*  PROT 5.5* 5.3* 5.1* 4.9* 5.1*  ALBUMIN 2.0* 1.9* 1.8* 1.7* 1.7*   No results for input(s): LIPASE, AMYLASE in the last 168 hours. Recent Labs  Lab 12/15/19 1600  AMMONIA 25   Coagulation Profile: Recent Labs  Lab 12/15/19 1332 12/16/19 0351  INR 1.3* 1.3*   Cardiac Enzymes: No results for input(s): CKTOTAL, CKMB, CKMBINDEX, TROPONINI in the last 168 hours. BNP (last 3 results) No results for input(s): PROBNP in the last 8760 hours. HbA1C: No results for input(s): HGBA1C in the last 72 hours. CBG: No results for input(s): GLUCAP in the last 168 hours. Lipid Profile: No results for input(s):  CHOL, HDL, LDLCALC, TRIG, CHOLHDL, LDLDIRECT in the last 72 hours. Thyroid Function Tests: No results for input(s): TSH, T4TOTAL, FREET4, T3FREE, THYROIDAB in the last 72 hours. Anemia Panel: No results for input(s): VITAMINB12, FOLATE, FERRITIN, TIBC, IRON, RETICCTPCT in the last 72 hours. Sepsis Labs: Recent Labs  Lab 12/15/19 1332 12/15/19 1600 12/16/19 0351  PROCALCITON  --   --  0.25  LATICACIDVEN 1.9 1.4  --     Recent Results (from the past 240 hour(s))  Blood Culture (routine x 2)     Status: None (Preliminary result)   Collection Time: 12/15/19  1:32 PM   Specimen: BLOOD  Result Value Ref Range Status   Specimen Description BLOOD LEFT ANTECUBITAL  Final   Special Requests   Final    BOTTLES DRAWN AEROBIC AND ANAEROBIC Blood Culture results may not be optimal due to an inadequate volume of blood received in culture bottles  Culture   Final    NO GROWTH 3 DAYS Performed at [facility], [place] [address], [place], [place] [address]    Report Status PENDING  Incomplete  Urine culture     Status: None   Collection Time: 12/15/19  1:32 PM   Specimen: In/Out Cath Urine  Result Value Ref Range Status   Specimen Description IN/OUT CATH URINE  Final   Special Requests NONE  Final   Culture   Final    NO GROWTH Performed at [facility], [place] [address], [place], [place] [address]    Report Status 12/16/2019 FINAL  Final  Blood Culture (routine x 2)     Status: None (Preliminary result)   Collection Time: 12/15/19  1:48 PM   Specimen: BLOOD RIGHT FOREARM  Result Value Ref Range Status   Specimen Description BLOOD RIGHT FOREARM  Final   Special Requests   Final    BOTTLES DRAWN AEROBIC AND ANAEROBIC Blood Culture results may not be optimal due to an inadequate volume of blood received in culture bottles   Culture   Final    NO GROWTH 3 DAYS Performed at [facility], [place] [address], [place], [place] [address]    Report Status PENDING  Incomplete  SARS  Coronavirus 2 by RT PCR (hospital order, performed in [facility] hospital lab) Nasopharyngeal Nasopharyngeal Swab     Status: None   Collection Time: 12/15/19  2:09 PM   Specimen: Nasopharyngeal Swab  Result Value Ref Range Status   SARS Coronavirus 2 NEGATIVE NEGATIVE Final    Comment: (NOTE) SARS-CoV-2 target nucleic acids are NOT DETECTED.  The SARS-CoV-2 RNA is generally detectable in upper and lower respiratory specimens during the acute phase of infection. The lowest concentration of SARS-CoV-2 viral copies this assay can detect is 250 copies / mL. A negative result does not preclude SARS-CoV-2 infection and should not be used as the sole basis for treatment or other patient management decisions.  A negative result may occur with improper specimen collection / handling, submission of specimen other than nasopharyngeal swab, presence of viral mutation(s) within the areas targeted by this assay, and inadequate number of viral copies (<250 copies / mL). A negative result must be combined with clinical observations, patient history, and epidemiological information.  Fact Sheet for Patients:   StrictlyIdeas.no  Fact Sheet for Healthcare Providers: BankingDealers.co.za  This test is not yet approved or  cleared by the [place] FDA and has been authorized for detection and/or diagnosis of SARS-CoV-2 by FDA under an Emergency Use Authorization (EUA).  This EUA will remain in effect (meaning this test can be used) for the duration of the COVID-19 declaration under Section 564(b)(1) of the Act, 21 U.S.C. section 360bbb-3(b)(1), unless the authorization is terminated or revoked sooner.  Performed at [facility], [place] [address] [place], [place] [address]          Radiology Studies: No results found.      Scheduled Meds:  enoxaparin (LOVENOX) injection  40 mg Subcutaneous Q24H   pantoprazole (PROTONIX) IV  40  mg Intravenous Q24H   Continuous Infusions:  dextrose 5 % and 0.9% NaCl 100 mL/hr at 12/18/19 2030   lactated ringers       LOS: 4 days   Time spent= 35 mins    Colbert Curenton Arsenio Loader, MD [facility]  If 7PM-7AM, please contact night-coverage  12/19/2019, 9:35 AM

## 2019-12-19 NOTE — Progress Notes (Addendum)
Subjective: Feeling well.  No new complaints.  He still has a headache.  Objective: Vital signs in last 24 hours: Temp:  [98.6 F (37 C)-101.3 F (38.5 C)] 98.6 F (37 C) (09/26 0436) Pulse Rate:  [82-103] 97 (09/26 0436) Resp:  [18-20] 18 (09/26 0436) BP: (101-110)/(58-73) 110/73 (09/26 0436) SpO2:  [93 %-95 %] 93 % (09/26 0436) Weight:  [84.1 kg] 84.1 kg (09/26 0154) Last BM Date: 12/18/19  Intake/Output from previous day: 09/25 0701 - 09/26 0700 In: 3351.3 [P.O.:1080; I.V.:2271.3] Out: 1 [Stool:1] Intake/Output this shift: No intake/output data recorded.  General appearance: alert and no distress Resp: clear to auscultation bilaterally Cardio: regular rate and rhythm GI: soft, non-tender; bowel sounds normal; no masses,  no organomegaly Extremities: extremities normal, atraumatic, no cyanosis or edema  Lab Results: Recent Labs    12/17/19 0920 12/18/19 0811 12/19/19 0412  WBC 5.5 5.1 5.0  HGB 8.9* 8.8* 8.9*  HCT 29.1* 29.0* 29.3*  PLT 201 196 195   BMET Recent Labs    12/17/19 0920 12/18/19 0811 12/19/19 0412  NA 134* 135 134*  K 3.5 3.4* 3.5  CL 101 102 104  CO2 24 22 25   GLUCOSE 154* 162* 150*  BUN 9 7 7   CREATININE 0.79 0.82 0.74  CALCIUM 7.9* 7.8* 7.7*   LFT Recent Labs    12/19/19 0412  PROT 5.1*  ALBUMIN 1.7*  AST 71*  ALT 176*  ALKPHOS 172*  BILITOT 0.2*   PT/INR No results for input(s): LABPROT, INR in the last 72 hours. Hepatitis Panel No results for input(s): HEPBSAG, HCVAB, HEPAIGM, HEPBIGM in the last 72 hours. C-Diff No results for input(s): CDIFFTOX in the last 72 hours. Fecal Lactopherrin No results for input(s): FECLLACTOFRN in the last 72 hours.  Studies/Results: No results found.  Medications:  Scheduled: . enoxaparin (LOVENOX) injection  40 mg Subcutaneous Q24H  . pantoprazole (PROTONIX) IV  40 mg Intravenous Q24H   Continuous: . dextrose 5 % and 0.9% NaCl 100 mL/hr at 12/18/19 2030  . lactated ringers       Assessment/Plan: 1) Presumed DILI. 2) Fever. 3) Headache.   The patient's liver enzymes are slightly worsened today, but this is not out of the pattern for DILI.  However, it is prudent to consider other issues as he continues to suffer with fever.  His Tmax was 101.3 on 12/18/19.  The patient is not septic.  He is not on any medications that causes a drug fever.  Some liver injuries can cause fever, but I do not feel that this is contributing.  His RUQ U/S showed hepatomegaly and steatosis.  He does not appear to have DRESS.  His admission CBC with diff did not show any elevation in eosinophils.  Plan: 1) Continue with supportive care. 2) Monitor liver enzymes. 3) ? ID and or Neurology consult for headaches and fever.  LOS: 4 days   Kenna Kirn D 12/19/2019, 7:55 AM

## 2019-12-20 DIAGNOSIS — Z7289 Other problems related to lifestyle: Secondary | ICD-10-CM

## 2019-12-20 LAB — HEPATIC FUNCTION PANEL
ALT: 283 U/L — ABNORMAL HIGH (ref 0–44)
AST: 209 U/L — ABNORMAL HIGH (ref 15–41)
Albumin: 1.6 g/dL — ABNORMAL LOW (ref 3.5–5.0)
Alkaline Phosphatase: 200 U/L — ABNORMAL HIGH (ref 38–126)
Bilirubin, Direct: 0.3 mg/dL — ABNORMAL HIGH (ref 0.0–0.2)
Indirect Bilirubin: 0.3 mg/dL (ref 0.3–0.9)
Total Bilirubin: 0.6 mg/dL (ref 0.3–1.2)
Total Protein: 4.9 g/dL — ABNORMAL LOW (ref 6.5–8.1)

## 2019-12-20 LAB — IRON AND TIBC
Iron: 12 ug/dL — ABNORMAL LOW (ref 45–182)
Saturation Ratios: 6 % — ABNORMAL LOW (ref 17.9–39.5)
TIBC: 188 ug/dL — ABNORMAL LOW (ref 250–450)
UIBC: 176 ug/dL

## 2019-12-20 LAB — BASIC METABOLIC PANEL
Anion gap: 6 (ref 5–15)
BUN: 7 mg/dL (ref 6–20)
CO2: 25 mmol/L (ref 22–32)
Calcium: 7.7 mg/dL — ABNORMAL LOW (ref 8.9–10.3)
Chloride: 102 mmol/L (ref 98–111)
Creatinine, Ser: 0.78 mg/dL (ref 0.61–1.24)
GFR calc Af Amer: >60 mL/min (ref >60)
GFR calc non Af Amer: >60 mL/min (ref >60)
Glucose, Bld: 140 mg/dL — ABNORMAL HIGH (ref 70–99)
Potassium: 3.3 mmol/L — ABNORMAL LOW (ref 3.5–5.1)
Sodium: 133 mmol/L — ABNORMAL LOW (ref 135–145)

## 2019-12-20 LAB — CULTURE, BLOOD (ROUTINE X 2)
Culture: NO GROWTH
Culture: NO GROWTH

## 2019-12-20 LAB — PROTIME-INR
INR: 1.4 — ABNORMAL HIGH (ref 0.8–1.2)
Prothrombin Time: 16.2 seconds — ABNORMAL HIGH (ref 11.4–15.2)

## 2019-12-20 LAB — FERRITIN: Ferritin: 2845 ng/mL — ABNORMAL HIGH (ref 24–336)

## 2019-12-20 MED ORDER — POTASSIUM CHLORIDE CRYS ER 20 MEQ PO TBCR
40.0000 meq | EXTENDED_RELEASE_TABLET | Freq: Once | ORAL | Status: AC
  Administered 2019-12-20: 40 meq via ORAL
  Filled 2019-12-20: qty 2

## 2019-12-20 MED ORDER — GUAIFENESIN 100 MG/5ML PO SOLN
5.0000 mL | ORAL | Status: DC | PRN
  Administered 2019-12-20 – 2019-12-21 (×4): 100 mg via ORAL
  Filled 2019-12-20 (×4): qty 5

## 2019-12-20 NOTE — Plan of Care (Signed)
  Problem: Education: Goal: Knowledge of General Education information will improve Description: Including pain rating scale, medication(s)/side effects and non-pharmacologic comfort measures Outcome: Progressing   Problem: Clinical Measurements: Goal: Diagnostic test results will improve Outcome: Progressing   Problem: Coping: Goal: Level of anxiety will decrease Outcome: Progressing   

## 2019-12-20 NOTE — Progress Notes (Signed)
[facility] for Infectious Disease  Date of Admission:  12/15/2019     Total days of antibiotics 3         ASSESSMENT:  Brian Ochoa has remained afebrile overnight and continues to have transaminitis which is worse today compared to previous. Blood work in process for CMV and Brian Ochoa as alternative source of potential infection. US abdomen with hepatomegaly with diffuse hepatic steatosis. Will continue to monitor off antibiotics at this time and await blood work results.   PLAN:  1. Check CMV PCR. 2. Await lab results for CMV and Brian Ochoa.  3. Continue to monitor off antibiotics at this time.   Principal Problem:   Sepsis ([place]) Active Problems:   Hepatitis C antibody positive in blood   Hyponatremia   Transaminitis   . enoxaparin (LOVENOX) injection  40 mg Subcutaneous Q24H  . pantoprazole  40 mg Oral Q24H    SUBJECTIVE:  Afebrile overnight with no acute events. LFTs up from yesterday. Has a cough that has been ongoing prior to this starting. Has received cough medication which is helping. Hoping to go home soon.   No Known Allergies   Review of Systems: Review of Systems  Constitutional: Negative for chills, fever, malaise/fatigue and weight loss.  Respiratory: Negative for cough, shortness of breath and wheezing.   Cardiovascular: Negative for chest pain and leg swelling.  Gastrointestinal: Negative for abdominal pain, constipation, diarrhea, nausea and vomiting.  Skin: Negative for rash.  Neurological: Negative for weakness and headaches.  Endo/Heme/Allergies: Does not bruise/bleed easily.      OBJECTIVE: Vitals:   12/19/19 1645 12/19/19 2151 12/20/19 0538 12/20/19 0931  BP: 133/87 110/68 110/66 130/73  Pulse: (!) 108 99 96 99  Resp: 18 18 18 18   Temp: 99.5 F (37.5 C) 99 F (37.2 C) 99.3 F (37.4 C) 99.6 F (37.6 C)  TempSrc: Oral Oral Oral Oral  SpO2: 98% 98% 94% 96%  Weight:      Height:       Body mass index is 26.6  kg/m.  Physical Exam Constitutional:      General: He is not in acute distress.    Appearance: He is well-developed.  Cardiovascular:     Rate and Rhythm: Normal rate and regular rhythm.     Heart sounds: Normal heart sounds.  Pulmonary:     Effort: Pulmonary effort is normal.     Breath sounds: Normal breath sounds.  Skin:    General: Skin is warm and dry.  Neurological:     Mental Status: He is alert and oriented to person, place, and time.  Psychiatric:        Behavior: Behavior normal.        Thought Content: Thought content normal.        Judgment: Judgment normal.     Lab Results Lab Results  Component Value Date   WBC 5.0 12/19/2019   HGB 8.9 (L) 12/19/2019   HCT 29.3 (L) 12/19/2019   MCV 88.8 12/19/2019   PLT 195 12/19/2019    Lab Results  Component Value Date   CREATININE 0.78 12/20/2019   BUN 7 12/20/2019   NA 133 (L) 12/20/2019   K 3.3 (L) 12/20/2019   CL 102 12/20/2019   CO2 25 12/20/2019    Lab Results  Component Value Date   ALT 283 (H) 12/20/2019   AST 209 (H) 12/20/2019   ALKPHOS 200 (H) 12/20/2019   BILITOT 0.6 12/20/2019     Microbiology:  Recent Results (from the past 240 hour(s))  Blood Culture (routine x 2)     Status: None   Collection Time: 12/15/19  1:32 PM   Specimen: BLOOD  Result Value Ref Range Status   Specimen Description BLOOD LEFT ANTECUBITAL  Final   Special Requests   Final    BOTTLES DRAWN AEROBIC AND ANAEROBIC Blood Culture results may not be optimal due to an inadequate volume of blood received in culture bottles   Culture   Final    NO GROWTH 5 DAYS Performed at [facility], [place] [address], [place], [place] [address]    Report Status 12/20/2019 FINAL  Final  Urine culture     Status: None   Collection Time: 12/15/19  1:32 PM   Specimen: In/Out Cath Urine  Result Value Ref Range Status   Specimen Description IN/OUT CATH URINE  Final   Special Requests NONE  Final   Culture   Final    NO GROWTH Performed  at [facility], [place] [address] [place], [place] [address]    Report Status 12/16/2019 FINAL  Final  Blood Culture (routine x 2)     Status: None   Collection Time: 12/15/19  1:48 PM   Specimen: BLOOD RIGHT FOREARM  Result Value Ref Range Status   Specimen Description BLOOD RIGHT FOREARM  Final   Special Requests   Final    BOTTLES DRAWN AEROBIC AND ANAEROBIC Blood Culture results may not be optimal due to an inadequate volume of blood received in culture bottles   Culture   Final    NO GROWTH 5 DAYS Performed at [facility], [place] [address], [place], [place] [address]    Report Status 12/20/2019 FINAL  Final  SARS Coronavirus 2 by RT PCR (hospital order, performed in [facility] hospital lab) Nasopharyngeal Nasopharyngeal Swab     Status: None   Collection Time: 12/15/19  2:09 PM   Specimen: Nasopharyngeal Swab  Result Value Ref Range Status   SARS Coronavirus 2 NEGATIVE NEGATIVE Final    Comment: (NOTE) SARS-CoV-2 target nucleic acids are NOT DETECTED.  The SARS-CoV-2 RNA is generally detectable in upper and lower respiratory specimens during the acute phase of infection. The lowest concentration of SARS-CoV-2 viral copies this assay can detect is 250 copies / mL. A negative result does not preclude SARS-CoV-2 infection and should not be used as the sole basis for treatment or other Ochoa management decisions.  A negative result may occur with improper specimen collection / handling, submission of specimen other than nasopharyngeal swab, presence of viral mutation(s) within the areas targeted by this assay, and inadequate number of viral copies (<250 copies / mL). A negative result must be combined with clinical observations, Ochoa history, and epidemiological information.  Fact Sheet for Patients:   StrictlyIdeas.no  Fact Sheet for Healthcare Providers: BankingDealers.co.za  This test is not yet approved or   cleared by the [place] FDA and has been authorized for detection and/or diagnosis of SARS-CoV-2 by FDA under an Emergency Use Authorization (EUA).  This EUA will remain in effect (meaning this test can be used) for the duration of the COVID-19 declaration under Section 564(b)(1) of the Act, 21 U.S.C. section 360bbb-3(b)(1), unless the authorization is terminated or revoked sooner.  Performed at [facility], [place] [address], [place], [place] [address]      Terri Piedra, [facility]  12/20/2019  1:42  PM

## 2019-12-20 NOTE — Progress Notes (Signed)
PROGRESS NOTE    Brian Ochoa  BJS:[id number] DOB: Aug 15, 1960 DOA: 12/15/2019 PCP: Aletha Halim., PA-C   Brief Narrative:  59 years old male with medical history significant for prior history of TIA presented in the ER after his wife noted him to be confused and febrile.  Patient has been admitted for sepsis.  No obvious source was noted,  all the work-up done so far proved negative except positive for hepatitis C and elevated liver enzymes.  RUQ ultrasound showed no evidence of gallstones or obstructive uropathy.  Lumbar puncture was attempted but was not successful.  Urine drug screen is negative. GI was consulted, states this could drug induced liver injury ( DILI) sec to doxycycline. Advised supportive care. Given history of TIA,  MRI was completed which is unremarkable for acute infarct.  Recurrent fevers.    Assessment & Plan:   Principal Problem:   Sepsis ([place]) Active Problems:   Hepatitis C antibody positive in blood   Hyponatremia   Transaminitis  Sepsis could be due to acute hepatitis; presumed DILI Hepatitis C infection. Treated In the past.  Transaminitis Low-grade fevers -No clear source of infection, monitor off antibiotics -UA-negative. Chest x-ray-negative. Blood cultures-no growth to date. COVID-19-negative -Procalcitonin 0.25 -Unable to successfully perform LP. No meningeal signs -MRI-poor study but overall negative -GI and ID following.  Case discussed with Dr. Benson Norway and Dr. Baxter Flattery. -Right upper quadrant ultrasound-hepatomegaly and steatosis -LFTs have trended upwards. -Check ANA, AMA, ASMA, iron panel, ferritin, alpha-1 antitrypsin, IgG, PT/INR, EBV/CMV   Altered mental status:Resolved MRI brain is negative for any acute pathology. Currently mentation is back to baseline..  Daily alcohol use -No signs of withdrawal. Closely monitor   DVT prophylaxis: Lovenox Code Status: Full code Family Communication: Wife at bedside  Status is:  Inpatient  Remains inpatient appropriate because:Unsafe d/c plan   Dispo: The patient is from: Home              Anticipated d/c is to: Home              Anticipated d/c date is: 1 day              Patient currently is not medically stable to d/c.  Should be able to discharge him once this LFTs improve  Body mass index is 26.6 kg/m.   Subjective: No complaints besides minor cough feels okay  Review of Systems Otherwise negative except as per HPI, including: General: Denies fever, chills, night sweats or unintended weight loss. Resp: Denies cough, wheezing, shortness of breath. Cardiac: Denies chest pain, palpitations, orthopnea, paroxysmal nocturnal dyspnea. GI: Denies abdominal pain, nausea, vomiting, diarrhea or constipation GU: Denies dysuria, frequency, hesitancy or incontinence MS: Denies muscle aches, joint pain or swelling Neuro: Denies headache, neurologic deficits (focal weakness, numbness, tingling), abnormal gait Psych: Denies anxiety, depression, SI/HI/AVH Skin: Denies new rashes or lesions ID: Denies sick contacts, exotic exposures, travel  Examination:  Constitutional: Not in acute distress Respiratory: Clear to auscultation bilaterally Cardiovascular: Normal sinus rhythm, no rubs Abdomen: Nontender nondistended good bowel sounds Musculoskeletal: No edema noted Skin: No rashes seen Neurologic: CN 2-12 grossly intact.  And nonfocal Psychiatric: Normal judgment and insight. Alert and oriented x 3. Normal mood.    Objective: Vitals:   12/19/19 0926 12/19/19 1645 12/19/19 2151 12/20/19 0538  BP: 116/71 133/87 110/68 110/66  Pulse: 99 (!) 108 99 96  Resp: 18 18 18 18   Temp: 98.9 F (37.2 C) 99.5 F (37.5 C) 99 F (37.2 C)  99.3 F (37.4 C)  TempSrc: Oral Oral Oral Oral  SpO2: 97% 98% 98% 94%  Weight:      Height:        Intake/Output Summary (Last 24 hours) at 12/20/2019 0830 Last data filed at 12/20/2019 0800 Gross per 24 hour  Intake 3216.38 ml   Output 0 ml  Net 3216.38 ml   Filed Weights   12/17/19 2034 12/17/19 2050 12/19/19 0154  Weight: 86.3 kg 86.3 kg 84.1 kg     Data Reviewed:   CBC: Recent Labs  Lab 12/15/19 1332 12/16/19 0351 12/17/19 0920 12/18/19 0811 12/19/19 0412  WBC 9.2 6.1 5.5 5.1 5.0  NEUTROABS 7.1  --   --   --   --   HGB 9.7* 9.1* 8.9* 8.8* 8.9*  HCT 32.3* 29.9* 29.1* 29.0* 29.3*  MCV 91.0 90.3 89.5 89.0 88.8  PLT 246 204 201 196 921   Basic Metabolic Panel: Recent Labs  Lab 12/16/19 0351 12/17/19 0920 12/18/19 0811 12/19/19 0412 12/20/19 0508  NA 135 134* 135 134* 133*  K 3.7 3.5 3.4* 3.5 3.3*  CL 100 101 102 104 102  CO2 24 24 22 25 25   GLUCOSE 150* 154* 162* 150* 140*  BUN 11 9 7 7 7   CREATININE 0.89 0.79 0.82 0.74 0.78  CALCIUM 8.1* 7.9* 7.8* 7.7* 7.7*  MG  --  2.0 1.9 2.0  --   PHOS  --  3.3 2.7 2.4*  --    GFR: Estimated Creatinine Clearance: 105.2 mL/min (by C-G formula based on SCr of 0.78 mg/dL). Liver Function Tests: Recent Labs  Lab 12/15/19 1332 12/16/19 0351 12/17/19 0920 12/18/19 0811 12/19/19 0412  AST 143* 132* 77* 55* 71*  ALT 289* 307* 243* 184* 176*  ALKPHOS 230* 202* 179* 169* 172*  BILITOT 0.7 0.9 0.3 0.3 0.2*  PROT 5.5* 5.3* 5.1* 4.9* 5.1*  ALBUMIN 2.0* 1.9* 1.8* 1.7* 1.7*   No results for input(s): LIPASE, AMYLASE in the last 168 hours. Recent Labs  Lab 12/15/19 1600  AMMONIA 25   Coagulation Profile: Recent Labs  Lab 12/15/19 1332 12/16/19 0351  INR 1.3* 1.3*   Cardiac Enzymes: No results for input(s): CKTOTAL, CKMB, CKMBINDEX, TROPONINI in the last 168 hours. BNP (last 3 results) No results for input(s): PROBNP in the last 8760 hours. HbA1C: No results for input(s): HGBA1C in the last 72 hours. CBG: No results for input(s): GLUCAP in the last 168 hours. Lipid Profile: No results for input(s): CHOL, HDL, LDLCALC, TRIG, CHOLHDL, LDLDIRECT in the last 72 hours. Thyroid Function Tests: No results for input(s): TSH, T4TOTAL, FREET4,  T3FREE, THYROIDAB in the last 72 hours. Anemia Panel: No results for input(s): VITAMINB12, FOLATE, FERRITIN, TIBC, IRON, RETICCTPCT in the last 72 hours. Sepsis Labs: Recent Labs  Lab 12/15/19 1332 12/15/19 1600 12/16/19 0351  PROCALCITON  --   --  0.25  LATICACIDVEN 1.9 1.4  --     Recent Results (from the past 240 hour(s))  Blood Culture (routine x 2)     Status: None   Collection Time: 12/15/19  1:32 PM   Specimen: BLOOD  Result Value Ref Range Status   Specimen Description BLOOD LEFT ANTECUBITAL  Final   Special Requests   Final    BOTTLES DRAWN AEROBIC AND ANAEROBIC Blood Culture results may not be optimal due to an inadequate volume of blood received in culture bottles   Culture   Final    NO GROWTH 5 DAYS Performed at [facility],  [address] [place], [place] [address]    Report Status 12/20/2019 FINAL  Final  Urine culture     Status: None   Collection Time: 12/15/19  1:32 PM   Specimen: In/Out Cath Urine  Result Value Ref Range Status   Specimen Description IN/OUT CATH URINE  Final   Special Requests NONE  Final   Culture   Final    NO GROWTH Performed at [facility], [place] [address], [place], [place] [address]    Report Status 12/16/2019 FINAL  Final  Blood Culture (routine x 2)     Status: None   Collection Time: 12/15/19  1:48 PM   Specimen: BLOOD RIGHT FOREARM  Result Value Ref Range Status   Specimen Description BLOOD RIGHT FOREARM  Final   Special Requests   Final    BOTTLES DRAWN AEROBIC AND ANAEROBIC Blood Culture results may not be optimal due to an inadequate volume of blood received in culture bottles   Culture   Final    NO GROWTH 5 DAYS Performed at [facility], [place] [address], [place], [place] [address]    Report Status 12/20/2019 FINAL  Final  SARS Coronavirus 2 by RT PCR (hospital order, performed in [facility] hospital lab) Nasopharyngeal Nasopharyngeal Swab     Status: None   Collection Time: 12/15/19  2:09 PM    Specimen: Nasopharyngeal Swab  Result Value Ref Range Status   SARS Coronavirus 2 NEGATIVE NEGATIVE Final    Comment: (NOTE) SARS-CoV-2 target nucleic acids are NOT DETECTED.  The SARS-CoV-2 RNA is generally detectable in upper and lower respiratory specimens during the acute phase of infection. The lowest concentration of SARS-CoV-2 viral copies this assay can detect is 250 copies / mL. A negative result does not preclude SARS-CoV-2 infection and should not be used as the sole basis for treatment or other patient management decisions.  A negative result may occur with improper specimen collection / handling, submission of specimen other than nasopharyngeal swab, presence of viral mutation(s) within the areas targeted by this assay, and inadequate number of viral copies (<250 copies / mL). A negative result must be combined with clinical observations, patient history, and epidemiological information.  Fact Sheet for Patients:   StrictlyIdeas.no  Fact Sheet for Healthcare Providers: BankingDealers.co.za  This test is not yet approved or  cleared by the [place] FDA and has been authorized for detection and/or diagnosis of SARS-CoV-2 by FDA under an Emergency Use Authorization (EUA).  This EUA will remain in effect (meaning this test can be used) for the duration of the COVID-19 declaration under Section 564(b)(1) of the Act, 21 U.S.C. section 360bbb-3(b)(1), unless the authorization is terminated or revoked sooner.  Performed at [facility], [place] [address], [place],  [address]          Radiology Studies: No results found.      Scheduled Meds: . enoxaparin (LOVENOX) injection  40 mg Subcutaneous Q24H  . pantoprazole  40 mg Oral Q24H  . potassium chloride  40 mEq Oral Once   Continuous Infusions: . dextrose 5 % and 0.9% NaCl 100 mL/hr at 12/19/19 1730  . lactated ringers       LOS: 5 days   Time  spent= 35 mins    Ricci Paff Arsenio Loader, MD [facility]  If 7PM-7AM, please contact night-coverage  12/20/2019, 8:30 AM

## 2019-12-20 NOTE — Progress Notes (Signed)
UNASSIGNED PATIENT Subjective: Patient is a 59 year old white male with a history of Hepatitis C treated in 2008 [patient cannot remember which doctor treated him at that time but he vaguely remembers her office being off of [address]], admitted with history of fever and confusion.  Work-up revealed slightly elevated liver enzyme which have bumped up a little bit more today with a AST of 209 and ALT of 283 and an alkaline phosphatase of 200.  IV drug abuse with cocaine and over 40 years ago but not in the recent past. He drinks about 36 beers per week and occasionally drinks wine in addition to the beer he consumes.  Objective: Vital signs in last 24 hours: Temp:  [99 F (37.2 C)-99.6 F (37.6 C)] 99.6 F (37.6 C) (09/27 0931) Pulse Rate:  [96-108] 99 (09/27 0931) Resp:  [18] 18 (09/27 0931) BP: (110-133)/(66-87) 130/73 (09/27 0931) SpO2:  [94 %-98 %] 96 % (09/27 0931) Last BM Date: 12/18/19  Intake/Output from previous day: 09/26 0701 - 09/27 0700 In: 2976.4 [P.O.:1030; I.V.:1946.4] Out: 0  Intake/Output this shift: Total I/O In: 1179.6 [P.O.:480; I.V.:699.6] Out: -   General appearance: cooperative, appears stated age, fatigued, no distress, pale and slowed mentation, slightly slow to respond to questions and appears confused at times no evidence of asterixis Resp: clear to auscultation bilaterally Cardio: regular rate and rhythm, S1, S2 normal, no murmur, click, rub or gallop GI: soft, non-tender; bowel sounds normal; no masses,  no organomegaly Extremities: extremities normal, atraumatic, no cyanosis or edema  Lab Results: Recent Labs    12/18/19 0811 12/19/19 0412  WBC 5.1 5.0  HGB 8.8* 8.9*  HCT 29.0* 29.3*  PLT 196 195   BMET Recent Labs    12/18/19 0811 12/19/19 0412 12/20/19 0508  NA 135 134* 133*  K 3.4* 3.5 3.3*  CL 102 104 102  CO2 22 25 25   GLUCOSE 162* 150* 140*  BUN 7 7 7   CREATININE 0.82 0.74 0.78  CALCIUM 7.8* 7.7* 7.7*   LFT Recent Labs     12/20/19 0508  PROT 4.9*  ALBUMIN 1.6*  AST 209*  ALT 283*  ALKPHOS 200*  BILITOT 0.6  BILIDIR 0.3*  IBILI 0.3   PT/INR Recent Labs    12/20/19 1229  LABPROT 16.2*  INR 1.4*   Medications: I have reviewed the patient's current medications.  Assessment/Plan: 1) Elevated LFTs and an albumin of 1.6 with a past history of Hepatitis C treated in 2008, alcohol abuse and remote history of IVDA-multiple additional labs have been ordered we will wait for those results.  Abdominal ultrasound reveals hepatomegaly with steatosis.  2) ?Sepsis. 3) History of TIA MRI on admission was negative for any acute pathology.  LOS: 5 days   Juanita Craver 12/20/2019, 2:08 PM

## 2019-12-21 ENCOUNTER — Encounter (HOSPITAL_COMMUNITY): Payer: Self-pay | Admitting: Internal Medicine

## 2019-12-21 DIAGNOSIS — R509 Fever, unspecified: Secondary | ICD-10-CM

## 2019-12-21 LAB — CBC WITH DIFFERENTIAL/PLATELET
Abs Immature Granulocytes: 0.02 10*3/uL (ref 0.00–0.07)
Basophils Absolute: 0 10*3/uL (ref 0.0–0.1)
Basophils Relative: 0 %
Eosinophils Absolute: 0 10*3/uL (ref 0.0–0.5)
Eosinophils Relative: 0 %
HCT: 28.8 % — ABNORMAL LOW (ref 39.0–52.0)
Hemoglobin: 9 g/dL — ABNORMAL LOW (ref 13.0–17.0)
Immature Granulocytes: 0 %
Lymphocytes Relative: 18 %
Lymphs Abs: 0.9 10*3/uL (ref 0.7–4.0)
MCH: 27.5 pg (ref 26.0–34.0)
MCHC: 31.3 g/dL (ref 30.0–36.0)
MCV: 88.1 fL (ref 80.0–100.0)
Monocytes Absolute: 0.2 10*3/uL (ref 0.1–1.0)
Monocytes Relative: 3 %
Neutro Abs: 4.1 10*3/uL (ref 1.7–7.7)
Neutrophils Relative %: 79 %
Platelets: 224 10*3/uL (ref 150–400)
RBC: 3.27 MIL/uL — ABNORMAL LOW (ref 4.22–5.81)
RDW: 15 % (ref 11.5–15.5)
WBC: 5.2 10*3/uL (ref 4.0–10.5)
nRBC: 0 % (ref 0.0–0.2)

## 2019-12-21 LAB — URINALYSIS, ROUTINE W REFLEX MICROSCOPIC
Bacteria, UA: NONE SEEN
Bilirubin Urine: NEGATIVE
Glucose, UA: 50 mg/dL — AB
Ketones, ur: NEGATIVE mg/dL
Leukocytes,Ua: NEGATIVE
Nitrite: NEGATIVE
Protein, ur: NEGATIVE mg/dL
Specific Gravity, Urine: 1.011 (ref 1.005–1.030)
pH: 6 (ref 5.0–8.0)

## 2019-12-21 LAB — COMPREHENSIVE METABOLIC PANEL
ALT: 252 U/L — ABNORMAL HIGH (ref 0–44)
AST: 93 U/L — ABNORMAL HIGH (ref 15–41)
Albumin: 1.6 g/dL — ABNORMAL LOW (ref 3.5–5.0)
Alkaline Phosphatase: 225 U/L — ABNORMAL HIGH (ref 38–126)
Anion gap: 9 (ref 5–15)
BUN: 8 mg/dL (ref 6–20)
CO2: 26 mmol/L (ref 22–32)
Calcium: 7.9 mg/dL — ABNORMAL LOW (ref 8.9–10.3)
Chloride: 101 mmol/L (ref 98–111)
Creatinine, Ser: 0.85 mg/dL (ref 0.61–1.24)
GFR calc Af Amer: >60 mL/min (ref >60)
GFR calc non Af Amer: >60 mL/min (ref >60)
Glucose, Bld: 146 mg/dL — ABNORMAL HIGH (ref 70–99)
Potassium: 4.3 mmol/L (ref 3.5–5.1)
Sodium: 136 mmol/L (ref 135–145)
Total Bilirubin: 0.7 mg/dL (ref 0.3–1.2)
Total Protein: 4.9 g/dL — ABNORMAL LOW (ref 6.5–8.1)

## 2019-12-21 LAB — ALPHA-1-ANTITRYPSIN: A-1 Antitrypsin, Ser: 283 mg/dL — ABNORMAL HIGH (ref 101–187)

## 2019-12-21 LAB — ANA W/REFLEX IF POSITIVE: Anti Nuclear Antibody (ANA): NEGATIVE

## 2019-12-21 LAB — MITOCHONDRIAL ANTIBODIES: Mitochondrial M2 Ab, IgG: <20 Units (ref 0.0–20.0)

## 2019-12-21 LAB — EPSTEIN-BARR VIRUS VCA, IGM: EBV VCA IgM: <36 U/mL (ref 0.0–35.9)

## 2019-12-21 LAB — ANTI-SMOOTH MUSCLE ANTIBODY, IGG: F-Actin IgG: 8 Units (ref 0–19)

## 2019-12-21 LAB — CMV IGM: CMV IgM: <30 AU/mL (ref 0.0–29.9)

## 2019-12-21 LAB — IGG: IgG (Immunoglobin G), Serum: 802 mg/dL (ref 603–1613)

## 2019-12-21 LAB — EPSTEIN-BARR VIRUS VCA, IGG: EBV VCA IgG: 99.1 U/mL — ABNORMAL HIGH (ref 0.0–17.9)

## 2019-12-21 MED ORDER — BENZONATATE 100 MG PO CAPS
100.0000 mg | ORAL_CAPSULE | Freq: Three times a day (TID) | ORAL | Status: DC | PRN
  Administered 2019-12-21 (×2): 100 mg via ORAL
  Filled 2019-12-21 (×2): qty 1

## 2019-12-21 NOTE — Plan of Care (Signed)
  Problem: Health Behavior/Discharge Planning: Goal: Ability to manage health-related needs will improve Outcome: Progressing   Problem: Clinical Measurements: Goal: Ability to maintain clinical measurements within normal limits will improve Outcome: Progressing   

## 2019-12-21 NOTE — Progress Notes (Signed)
Subjective: No new complaints.  Headache is better.  Objective: Vital signs in last 24 hours: Temp:  [97.8 F (36.6 C)-102.4 F (39.1 C)] 98.3 F (36.8 C) (09/28 1608) Pulse Rate:  [82-121] 101 (09/28 1608) Resp:  [16-20] 18 (09/28 1608) BP: (94-140)/(56-86) 108/70 (09/28 1608) SpO2:  [93 %-100 %] 96 % (09/28 1608) Weight:  [84.1 kg] 84.1 kg (09/27 2038) Last BM Date: 12/21/19 (This morning)  Intake/Output from previous day: 09/27 0701 - 09/28 0700 In: 3478.5 [P.O.:1080; I.V.:2398.5] Out: -  Intake/Output this shift: Total I/O In: 1266.5 [P.O.:360; I.V.:906.5] Out: -   General appearance: alert and no distress Resp: clear to auscultation bilaterally Cardio: regular rate and rhythm GI: soft, non-tender; bowel sounds normal; no masses,  no organomegaly Extremities: extremities normal, atraumatic, no cyanosis or edema  Lab Results: Recent Labs    12/19/19 0412 12/21/19 1450  WBC 5.0 5.2  HGB 8.9* 9.0*  HCT 29.3* 28.8*  PLT 195 224   BMET Recent Labs    12/19/19 0412 12/20/19 0508 12/21/19 0455  NA 134* 133* 136  K 3.5 3.3* 4.3  CL 104 102 101  CO2 25 25 26   GLUCOSE 150* 140* 146*  BUN 7 7 8   CREATININE 0.74 0.78 0.85  CALCIUM 7.7* 7.7* 7.9*   LFT Recent Labs    12/20/19 0508 12/20/19 0508 12/21/19 0455  PROT 4.9*   < > 4.9*  ALBUMIN 1.6*   < > 1.6*  AST 209*   < > 93*  ALT 283*   < > 252*  ALKPHOS 200*   < > 225*  BILITOT 0.6   < > 0.7  BILIDIR 0.3*  --   --   IBILI 0.3  --   --    < > = values in this interval not displayed.   PT/INR Recent Labs    12/20/19 1229  LABPROT 16.2*  INR 1.4*   Hepatitis Panel No results for input(s): HEPBSAG, HCVAB, HEPAIGM, HEPBIGM in the last 72 hours. C-Diff No results for input(s): CDIFFTOX in the last 72 hours. Fecal Lactopherrin No results for input(s): FECLLACTOFRN in the last 72 hours.  Studies/Results: No results found.  Medications:  Scheduled: . enoxaparin (LOVENOX) injection  40 mg  Subcutaneous Q24H  . pantoprazole  40 mg Oral Q24H   Continuous: . dextrose 5 % and 0.9% NaCl 100 mL/hr at 12/21/19 1600  . lactated ringers      Assessment/Plan: 1) Abnormal liver enzymes. 2) Fever -Tmax 102.   The current work up is negative for any hepatic sources with serology.  He does have an elevated ferritin, but this is secondary to his ETOH use.  This is consistent with his findings of steatosis on the ultrasound.  His AP continues to increase and the AST/ALT fluctuate.  It is not clear if he has DILI.  There are other signs of worsening synthetic function in that his albumin is dropping, but he is eating very well.  This was directly observed.  His INR is mildly elevated.  Unless his HIV RNA is positive, it will be beneficial to perform a liver biopsy.  He can be discharged home after the biopsy.  Plan: 1) Request liver biopsy for tomorrow. 2) Continue supportive care.  LOS: 6 days   Ilia Dimaano D 12/21/2019, 4:33 PM

## 2019-12-21 NOTE — Progress Notes (Signed)
[facility] for Infectious Disease  Date of Admission:  12/15/2019     Total days of antibiotics 3         ASSESSMENT:  Brian Ochoa has once again spiked a fever now at 102 of unclear origin. Respiratory exam is rather unremarkable although does have a cough. Transaminitis is mildly improved although still elevated. There is no clear source for his fever with multiple test results pending. Discussed that differentials can remain broad and cannot exclude malignancy. Unlikely TB as he has had no high risk exposures.  Agree with repeating blood cultures and will add CBC w/ diff. Not likely UTI as he has no current symptoms. Recommend continuing to hold antibiotics unless medically necessary for worsening condition as he appears stable at present.   PLAN:  1. Continue to monitor off antibiotics. 2. Agree with blood cultures and will add CBC w/ diff.  3. Await other testing results including EBV and CMV.  4. Monitor fever curve and liver function tests.   Principal Problem:   Fever Active Problems:   Hepatitis C antibody positive in blood   Sepsis ([facility])   Hyponatremia   Transaminitis   . enoxaparin (LOVENOX) injection  40 mg Subcutaneous Q24H  . pantoprazole  40 mg Oral Q24H    SUBJECTIVE:  Mild increase in temperature overnight at 100.4 and now most recent temperature of 102.4. No acute events. AST improved to 93 and ALT 252 with ALP of 225. Has had night sweats going on for several months to the point that his bed linens are wet at night at times. Had an episode of this overnight. Has lost weight over the past 3 months initially starting out as intentional weight loss and then over the last 3 weeks has had decreased appetite and continued weight loss. Works as a Development worker, community. Has not traveled outside of the country.He uses smokeless tobacco on a regular basis. Denies any new pains. Has feelings of malaise that is unchanged from yesterday.   No Known Allergies   Review of  Systems: Review of Systems  Constitutional: Positive for malaise/fatigue. Negative for chills, fever and weight loss.  Respiratory: Positive for cough. Negative for shortness of breath and wheezing.   Cardiovascular: Negative for chest pain and leg swelling.  Gastrointestinal: Negative for abdominal pain, constipation, diarrhea, nausea and vomiting.  Skin: Negative for rash.      OBJECTIVE: Vitals:   12/21/19 0434 12/21/19 0900 12/21/19 1135 12/21/19 1401  BP: (!) 114/56 94/67 133/86 113/67  Pulse: (!) 104 (!) 103 99 (!) 121  Resp: 18 18 18 18   Temp: (!) 100.4 F (38 C) 97.8 F (36.6 C) 99.3 F (37.4 C) (!) 102.4 F (39.1 C)  TempSrc: Oral Oral Oral Oral  SpO2: 95% 100% 98%   Weight:      Height:       Body mass index is 26.6 kg/m.  Physical Exam Constitutional:      General: He is not in acute distress.    Appearance: He is well-developed. He is diaphoretic.  Cardiovascular:     Rate and Rhythm: Regular rhythm. Tachycardia present.     Heart sounds: Normal heart sounds.  Pulmonary:     Effort: Pulmonary effort is normal. No respiratory distress.     Breath sounds: Normal breath sounds. No wheezing, rhonchi or rales.  Abdominal:     General: Abdomen is flat. There is no distension.     Palpations: There is no mass.  Tenderness: There is no abdominal tenderness. There is no guarding or rebound.  Skin:    General: Skin is warm.  Neurological:     Mental Status: He is alert and oriented to person, place, and time.  Psychiatric:        Mood and Affect: Mood normal.     Lab Results Lab Results  Component Value Date   WBC 5.0 12/19/2019   HGB 8.9 (L) 12/19/2019   HCT 29.3 (L) 12/19/2019   MCV 88.8 12/19/2019   PLT 195 12/19/2019    Lab Results  Component Value Date   CREATININE 0.85 12/21/2019   BUN 8 12/21/2019   NA 136 12/21/2019   K 4.3 12/21/2019   CL 101 12/21/2019   CO2 26 12/21/2019    Lab Results  Component Value Date   ALT 252 (H)  12/21/2019   AST 93 (H) 12/21/2019   ALKPHOS 225 (H) 12/21/2019   BILITOT 0.7 12/21/2019     Microbiology: Recent Results (from the past 240 hour(s))  Blood Culture (routine x 2)     Status: None   Collection Time: 12/15/19  1:32 PM   Specimen: BLOOD  Result Value Ref Range Status   Specimen Description BLOOD LEFT ANTECUBITAL  Final   Special Requests   Final    BOTTLES DRAWN AEROBIC AND ANAEROBIC Blood Culture results may not be optimal due to an inadequate volume of blood received in culture bottles   Culture   Final    NO GROWTH 5 DAYS Performed at [facility], [place] [address] [place], [place] [address]    Report Status 12/20/2019 FINAL  Final  Urine culture     Status: None   Collection Time: 12/15/19  1:32 PM   Specimen: In/Out Cath Urine  Result Value Ref Range Status   Specimen Description IN/OUT CATH URINE  Final   Special Requests NONE  Final   Culture   Final    NO GROWTH Performed at [facility], [place] [address] [place], [place] [address]    Report Status 12/16/2019 FINAL  Final  Blood Culture (routine x 2)     Status: None   Collection Time: 12/15/19  1:48 PM   Specimen: BLOOD RIGHT FOREARM  Result Value Ref Range Status   Specimen Description BLOOD RIGHT FOREARM  Final   Special Requests   Final    BOTTLES DRAWN AEROBIC AND ANAEROBIC Blood Culture results may not be optimal due to an inadequate volume of blood received in culture bottles   Culture   Final    NO GROWTH 5 DAYS Performed at [facility], [place] [address], [place], [place] [address]    Report Status 12/20/2019 FINAL  Final  SARS Coronavirus 2 by RT PCR (hospital order, performed in [facility] hospital lab) Nasopharyngeal Nasopharyngeal Swab     Status: None   Collection Time: 12/15/19  2:09 PM   Specimen: Nasopharyngeal Swab  Result Value Ref Range Status   SARS Coronavirus 2 NEGATIVE NEGATIVE Final    Comment: (NOTE) SARS-CoV-2 target nucleic acids are NOT DETECTED.  The  SARS-CoV-2 RNA is generally detectable in upper and lower respiratory specimens during the acute phase of infection. The lowest concentration of SARS-CoV-2 viral copies this assay can detect is 250 copies / mL. A negative result does not preclude SARS-CoV-2 infection and should not be used as the sole basis for treatment or other patient management decisions.  A negative result may occur with  improper specimen collection / handling, submission of specimen other than nasopharyngeal swab, presence of viral mutation(s) within the areas targeted by this assay, and inadequate number of viral copies (<250 copies / mL). A negative result must be combined with clinical observations, patient history, and epidemiological information.  Fact Sheet for Patients:   StrictlyIdeas.no  Fact Sheet for Healthcare Providers: BankingDealers.co.za  This test is not yet approved or  cleared by the [place] FDA and has been authorized for detection and/or diagnosis of SARS-CoV-2 by FDA under an Emergency Use Authorization (EUA).  This EUA will remain in effect (meaning this test can be used) for the duration of the COVID-19 declaration under Section 564(b)(1) of the Act, 21 U.S.C. section 360bbb-3(b)(1), unless the authorization is terminated or revoked sooner.  Performed at [facility], [place] [address] [place], [place] [address]      Terri Piedra, [place] [facility]  12/21/2019  3:04 PM

## 2019-12-21 NOTE — Plan of Care (Signed)
  Problem: Education: Goal: Knowledge of General Education information will improve Description: Including pain rating scale, medication(s)/side effects and non-pharmacologic comfort measures Outcome: Progressing   Problem: Clinical Measurements: Goal: Respiratory complications will improve Outcome: Progressing   

## 2019-12-21 NOTE — Plan of Care (Signed)
  Problem: Education: Goal: Knowledge of General Education information will improve Description: Including pain rating scale, medication(s)/side effects and non-pharmacologic comfort measures Outcome: Progressing   Problem: Safety: Goal: Ability to remain free from injury will improve Outcome: Progressing   

## 2019-12-21 NOTE — Progress Notes (Signed)
PROGRESS NOTE    Brian Ochoa  IZT:[id number] DOB: November 16, 1960 DOA: 12/15/2019 PCP: Aletha Halim., PA-C   Brief Narrative:  59 years old male with medical history significant for prior history of TIA presented in the ER after his wife noted him to be confused and febrile.  Patient has been admitted for sepsis.  No obvious source was noted,  all the work-up done so far proved negative except positive for hepatitis C and elevated liver enzymes.  RUQ ultrasound showed no evidence of gallstones or obstructive uropathy.  Lumbar puncture was attempted but was not successful.  Urine drug screen is negative. GI was consulted, states this could drug induced liver injury ( DILI) sec to doxycycline. Advised supportive care. Given history of TIA,  MRI was completed which is unremarkable for acute infarct.  Recurrent fevers.    Assessment & Plan:   Principal Problem:   Sepsis ([place]) Active Problems:   Hepatitis C antibody positive in blood   Hyponatremia   Transaminitis   Fever  Sepsis could be due to acute hepatitis; presumed DILI Hepatitis C infection. Treated In the past.  Transaminitis -Fever of 100.4 overnight.  No obvious evidence of infection.  ID suggested holding off on antibiotics.  Wonder if he still has a mild case of bronchitis and needs antibiotics for it. -UA-negative. Chest x-ray-negative. Blood cultures-no growth to date. COVID-19-negative -Procalcitonin 0.25 -Unable to successfully perform LP. No meningeal signs -MRI-poor study but overall negative -GI and ID following.  Holding off on antibiotics. -Right upper quadrant ultrasound-hepatomegaly and steatosis -LFTs have trended upwards. -ANA-pending -CMV, IgG, mitochondrial antibody-negative -Alpha-1 antitrypsin-mildly elevated -PT/INR-mildly elevated  Altered mental status:Resolved MRI brain is negative for any acute pathology. Currently mentation is back to baseline..  Daily alcohol use -No signs of withdrawal.  Closely monitor   DVT prophylaxis: Lovenox Code Status: Full code Family Communication: Wife at bedside  Status is: Inpatient  Remains inpatient appropriate because:Unsafe d/c plan   Dispo: The patient is from: Home              Anticipated d/c is to: Home              Anticipated d/c date is: 1 day              Patient currently is not medically stable to d/c.  If he remains afebrile over the next 24 hours, he can be discharged Body mass index is 26.6 kg/m.   Subjective: Patient overall feels okay but had fever of 100.4 overnight.  Examination:  Constitutional: Not in acute distress Respiratory: Clear to auscultation bilaterally Cardiovascular: Normal sinus rhythm, no rubs Abdomen: Nontender nondistended good bowel sounds Musculoskeletal: No edema noted Skin: No rashes seen Neurologic: CN 2-12 grossly intact.  And nonfocal Psychiatric: Normal judgment and insight. Alert and oriented x 3. Normal mood.   Objective: Vitals:   12/20/19 2341 12/21/19 0434 12/21/19 0900 12/21/19 1135  BP: 102/69 (!) 114/56 94/67 133/86  Pulse: 87 (!) 104 (!) 103 99  Resp: 16 18 18 18   Temp: 98.2 F (36.8 C) (!) 100.4 F (38 C) 97.8 F (36.6 C) 99.3 F (37.4 C)  TempSrc: Oral Oral Oral Oral  SpO2: 96% 95% 100% 98%  Weight:      Height:        Intake/Output Summary (Last 24 hours) at 12/21/2019 1311 Last data filed at 12/21/2019 1200 Gross per 24 hour  Intake 3071.33 ml  Output --  Net 3071.33 ml   [employer]  12/17/19 2050 12/19/19 0154 12/20/19 2038  Weight: 86.3 kg 84.1 kg 84.1 kg     Data Reviewed:   CBC: Recent Labs  Lab 12/15/19 1332 12/16/19 0351 12/17/19 0920 12/18/19 0811 12/19/19 0412  WBC 9.2 6.1 5.5 5.1 5.0  NEUTROABS 7.1  --   --   --   --   HGB 9.7* 9.1* 8.9* 8.8* 8.9*  HCT 32.3* 29.9* 29.1* 29.0* 29.3*  MCV 91.0 90.3 89.5 89.0 88.8  PLT 246 204 201 196 416   Basic Metabolic Panel: Recent Labs  Lab 12/17/19 0920 12/18/19 0811  12/19/19 0412 12/20/19 0508 12/21/19 0455  NA 134* 135 134* 133* 136  K 3.5 3.4* 3.5 3.3* 4.3  CL 101 102 104 102 101  CO2 24 22 25 25 26   GLUCOSE 154* 162* 150* 140* 146*  BUN 9 7 7 7 8   CREATININE 0.79 0.82 0.74 0.78 0.85  CALCIUM 7.9* 7.8* 7.7* 7.7* 7.9*  MG 2.0 1.9 2.0  --   --   PHOS 3.3 2.7 2.4*  --   --    GFR: Estimated Creatinine Clearance: 99 mL/min (by C-G formula based on SCr of 0.85 mg/dL). Liver Function Tests: Recent Labs  Lab 12/17/19 0920 12/18/19 0811 12/19/19 0412 12/20/19 0508 12/21/19 0455  AST 77* 55* 71* 209* 93*  ALT 243* 184* 176* 283* 252*  ALKPHOS 179* 169* 172* 200* 225*  BILITOT 0.3 0.3 0.2* 0.6 0.7  PROT 5.1* 4.9* 5.1* 4.9* 4.9*  ALBUMIN 1.8* 1.7* 1.7* 1.6* 1.6*   No results for input(s): LIPASE, AMYLASE in the last 168 hours. Recent Labs  Lab 12/15/19 1600  AMMONIA 25   Coagulation Profile: Recent Labs  Lab 12/15/19 1332 12/16/19 0351 12/20/19 1229  INR 1.3* 1.3* 1.4*   Cardiac Enzymes: No results for input(s): CKTOTAL, CKMB, CKMBINDEX, TROPONINI in the last 168 hours. BNP (last 3 results) No results for input(s): PROBNP in the last 8760 hours. HbA1C: No results for input(s): HGBA1C in the last 72 hours. CBG: No results for input(s): GLUCAP in the last 168 hours. Lipid Profile: No results for input(s): CHOL, HDL, LDLCALC, TRIG, CHOLHDL, LDLDIRECT in the last 72 hours. Thyroid Function Tests: No results for input(s): TSH, T4TOTAL, FREET4, T3FREE, THYROIDAB in the last 72 hours. Anemia Panel: Recent Labs    12/20/19 1229  FERRITIN 2,845*  TIBC 188*  IRON 12*   Sepsis Labs: Recent Labs  Lab 12/15/19 1332 12/15/19 1600 12/16/19 0351  PROCALCITON  --   --  0.25  LATICACIDVEN 1.9 1.4  --     Recent Results (from the past 240 hour(s))  Blood Culture (routine x 2)     Status: None   Collection Time: 12/15/19  1:32 PM   Specimen: BLOOD  Result Value Ref Range Status   Specimen Description BLOOD LEFT ANTECUBITAL   Final   Special Requests   Final    BOTTLES DRAWN AEROBIC AND ANAEROBIC Blood Culture results may not be optimal due to an inadequate volume of blood received in culture bottles   Culture   Final    NO GROWTH 5 DAYS Performed at [facility], [place] [address], [place], [place] [address]    Report Status 12/20/2019 FINAL  Final  Urine culture     Status: None   Collection Time: 12/15/19  1:32 PM   Specimen: In/Out Cath Urine  Result Value Ref Range Status   Specimen Description IN/OUT CATH URINE  Final   Special Requests NONE  Final  Culture   Final    NO GROWTH Performed at [facility], [place] [address], [place], [place] [address]    Report Status 12/16/2019 FINAL  Final  Blood Culture (routine x 2)     Status: None   Collection Time: 12/15/19  1:48 PM   Specimen: BLOOD RIGHT FOREARM  Result Value Ref Range Status   Specimen Description BLOOD RIGHT FOREARM  Final   Special Requests   Final    BOTTLES DRAWN AEROBIC AND ANAEROBIC Blood Culture results may not be optimal due to an inadequate volume of blood received in culture bottles   Culture   Final    NO GROWTH 5 DAYS Performed at [facility], [place] [address], [place], [place] [address]    Report Status 12/20/2019 FINAL  Final  SARS Coronavirus 2 by RT PCR (hospital order, performed in [facility] hospital lab) Nasopharyngeal Nasopharyngeal Swab     Status: None   Collection Time: 12/15/19  2:09 PM   Specimen: Nasopharyngeal Swab  Result Value Ref Range Status   SARS Coronavirus 2 NEGATIVE NEGATIVE Final    Comment: (NOTE) SARS-CoV-2 target nucleic acids are NOT DETECTED.  The SARS-CoV-2 RNA is generally detectable in upper and lower respiratory specimens during the acute phase of infection. The lowest concentration of SARS-CoV-2 viral copies this assay can detect is 250 copies / mL. A negative result does not preclude SARS-CoV-2 infection and should not be used as the sole basis for treatment or  other patient management decisions.  A negative result may occur with improper specimen collection / handling, submission of specimen other than nasopharyngeal swab, presence of viral mutation(s) within the areas targeted by this assay, and inadequate number of viral copies (<250 copies / mL). A negative result must be combined with clinical observations, patient history, and epidemiological information.  Fact Sheet for Patients:   StrictlyIdeas.no  Fact Sheet for Healthcare Providers: BankingDealers.co.za  This test is not yet approved or  cleared by the [place] FDA and has been authorized for detection and/or diagnosis of SARS-CoV-2 by FDA under an Emergency Use Authorization (EUA).  This EUA will remain in effect (meaning this test can be used) for the duration of the COVID-19 declaration under Section 564(b)(1) of the Act, 21 U.S.C. section 360bbb-3(b)(1), unless the authorization is terminated or revoked sooner.  Performed at [facility], [place] [address], [place], [place] [address]          Radiology Studies: No results found.      Scheduled Meds: . enoxaparin (LOVENOX) injection  40 mg Subcutaneous Q24H  . pantoprazole  40 mg Oral Q24H   Continuous Infusions: . dextrose 5 % and 0.9% NaCl 20 mL/hr at 12/21/19 1200  . lactated ringers       LOS: 6 days   Time spent= 35 mins    Whitnie Deleon Arsenio Loader, MD [facility]  If 7PM-7AM, please contact night-coverage  12/21/2019, 1:11 PM

## 2019-12-21 NOTE — Progress Notes (Signed)
    12/21/19 1401  Assess: MEWS Score  Temp (!) 102.4 F (39.1 C)  BP 113/67  Pulse Rate (!) 121  Resp 18  Level of Consciousness Alert  Assess: MEWS Score  MEWS Temp 2  MEWS Systolic 0  MEWS Pulse 2  MEWS RR 0  MEWS LOC 0  MEWS Score 4  MEWS Score Color Red  Assess: if the MEWS score is Yellow or Red  Were vital signs taken at a resting state? Yes  Focused Assessment No change from prior assessment  Early Detection of Sepsis Score *See Row Information* Low  MEWS guidelines implemented *See Row Information* Yes  Take Vital Signs  Increase Vital Sign Frequency  Red: Q 1hr X 4 then Q 4hr X 4, if remains red, continue Q 4hrs  Escalate  MEWS: Escalate Red: discuss with charge nurse/RN and provider, consider discussing with RRT  Notify: Charge Nurse/RN  Name of Charge Nurse/RN Notified Dario Ave  Date Charge Nurse/RN Notified 12/21/19  Time Charge Nurse/RN Notified [id number]  Notify: Provider  Provider Name/Title Reesa Chew  Date Provider Notified 12/21/19  Time Provider Notified 1405  Notification Type Page  Notification Reason Change in status  Response Other (Comment) (waiting on new orders)  Document  Patient Outcome Other (Comment) (waiting )  Progress note created (see row info) Yes

## 2019-12-21 NOTE — Progress Notes (Signed)
   12/20/19 2038  Vitals  Temp (!) 100.6 F (38.1 C)  Temp Source Oral  BP (!) 119/58  MAP (mmHg) 75  BP Location Left Arm  BP Method Automatic  Patient Position (if appropriate) Lying  Pulse Rate (!) 105  Pulse Rate Source Monitor  Resp 16  MEWS COLOR  MEWS Score Color Yellow  Oxygen Therapy  SpO2 93 %  O2 Device Room Air  Pain Assessment  Pain Scale 0-10  Pain Score 0  Height and Weight  Weight 84.1 kg  BMI (Calculated) 26.6  MEWS Score  MEWS Temp 1  MEWS Systolic 0  MEWS Pulse 1  MEWS RR 0  MEWS LOC 0  MEWS Score 2  Rapid Response Notification  Name of Rapid Response RN Notified MD Judy Pimple    textpaged oncall provider MD Anson Fret. Made aware about yellow mews. No new orders. Tylenol 650mg  PO given

## 2019-12-22 ENCOUNTER — Encounter: Payer: Self-pay | Admitting: Physician Assistant

## 2019-12-22 DIAGNOSIS — Z720 Tobacco use: Secondary | ICD-10-CM

## 2019-12-22 DIAGNOSIS — J40 Bronchitis, not specified as acute or chronic: Secondary | ICD-10-CM

## 2019-12-22 LAB — COMPREHENSIVE METABOLIC PANEL
ALT: 223 U/L — ABNORMAL HIGH (ref 0–44)
AST: 84 U/L — ABNORMAL HIGH (ref 15–41)
Albumin: 1.6 g/dL — ABNORMAL LOW (ref 3.5–5.0)
Alkaline Phosphatase: 222 U/L — ABNORMAL HIGH (ref 38–126)
Anion gap: 9 (ref 5–15)
BUN: 6 mg/dL (ref 6–20)
CO2: 26 mmol/L (ref 22–32)
Calcium: 7.7 mg/dL — ABNORMAL LOW (ref 8.9–10.3)
Chloride: 98 mmol/L (ref 98–111)
Creatinine, Ser: 0.82 mg/dL (ref 0.61–1.24)
GFR calc Af Amer: >60 mL/min (ref >60)
GFR calc non Af Amer: >60 mL/min (ref >60)
Glucose, Bld: 135 mg/dL — ABNORMAL HIGH (ref 70–99)
Potassium: 3.6 mmol/L (ref 3.5–5.1)
Sodium: 133 mmol/L — ABNORMAL LOW (ref 135–145)
Total Bilirubin: 0.7 mg/dL (ref 0.3–1.2)
Total Protein: 4.9 g/dL — ABNORMAL LOW (ref 6.5–8.1)

## 2019-12-22 LAB — RSV(RESPIRATORY SYNCYTIAL VIRUS) AB, BLOOD: RSV Ab: 1:16 {titer} — ABNORMAL HIGH

## 2019-12-22 LAB — CMV DNA, QUANTITATIVE, PCR
CMV DNA Quant: NEGATIVE IU/mL
Log10 CMV Qn DNA Pl: UNDETERMINED log10 IU/mL

## 2019-12-22 MED ORDER — PANTOPRAZOLE SODIUM 40 MG PO TBEC
40.0000 mg | DELAYED_RELEASE_TABLET | Freq: Every day | ORAL | 0 refills | Status: DC
Start: 2019-12-22 — End: 2020-01-07

## 2019-12-22 MED ORDER — LEVOFLOXACIN 750 MG PO TABS: 750.0000 mg | ORAL_TABLET | Freq: Every day | ORAL | 0 refills | Status: AC

## 2019-12-22 MED FILL — PANTOPRAZOLE SOD DR 40 MG T: 40 | 30 days supply | Qty: 30 | Fill #0

## 2019-12-22 NOTE — Discharge Summary (Signed)
Physician Discharge Summary  Brian Ochoa ZLD:[id number] DOB: 1960/04/10 DOA: 12/15/2019  PCP: Aletha Halim., PA-C  Admit date: 12/15/2019 Discharge date: 12/22/2019  Admitted From: Home Disposition: Home  Recommendations for Outpatient Follow-up:  1. Follow up with PCP in 1-2 weeks 2. Please obtain BMP/CBC in one week your next doctors visit.  3. Follow-up outpatient gastroenterology, Dr. Benson Norway 4. PPI to be taken daily 30 minutes before breakfast 5. Advised to quit drinking alcohol, smoking, spicy food, caffeine, chocolate and anything that exacerbates his GERD 6. Continue using albuterol 7. 5 days of oral Levaquin prescribed   Discharge Condition: Stable CODE STATUS: Full code Diet recommendation: Heart healthy  Brief/Interim Summary: 59 years old male with medical history significant for prior history of TIA presented in the ER after his wife noted him to be confused and febrile. Patient has been admitted for sepsis. No obvious source was noted, all the work-up done so far proved negative except positive for hepatitis C and elevated liver enzymes. RUQ ultrasound showed no evidence of gallstones or obstructive uropathy. Lumbar puncture was attempted but was not successful. Urine drug screen is negative. GI was consulted, states this could drug induced liver injury ( DILI) sec to doxycycline. Advised supportive care. Given history of TIA, MRI was completed which is unremarkable for acute infarct. Recurrent fevers.  Patient will be discharged on 5 days of oral Levaquin with outpatient follow-up with infectious disease and gastroenterology. Autoimmune work-up was sent during the hospitalization including EBV/CMV.  Part of that result was pending.     Assessment & Plan:   Principal Problem:   Sepsis ([place]) Active Problems:   Hepatitis C antibody positive in blood   Hyponatremia   Transaminitis   Fever  Sepsis could be due to acute hepatitis; presumed DILI Hepatitis  C infection. Treated In the past.  Transaminitis -Intermittent fevers.  Discussed with Dr. Linus Salmons.  We will treat this as bronchitis with short course of Levaquin.  He will follow up with their service next week. -UA-negative. Chest x-ray-negative. Blood cultures-no growth to date. COVID-19-negative -Procalcitonin 0.25 -Unable to successfully perform LP. No meningeal signs -MRI-poor study but overall negative -GI and ID following.  Holding off on antibiotics. -Right upper quadrant ultrasound-hepatomegaly and steatosis -LFTs have trended upwards. -ANA-pending -CMV, IgG, mitochondrial antibody-negative -Alpha-1 antitrypsin-mildly elevated -PT/INR-mildly elevated -We will get outpatient liver biopsy.  Persistent cough likely from bronchitis and tobacco use -Bronchodilators prescribed.  Will give short round of Levaquin as well. -For his cough I also advised him to take PPI daily 30 minutes before meals.  Avoid any food or alcohol that triggers his reflux.  Altered mental status:Resolved MRI brain is negative for any acute pathology. Currently mentation is back to baseline..  Daily alcohol use -No signs of withdrawal. Closely monitor    Body mass index is 26.6 kg/m.      Discharge Diagnoses:  Principal Problem:   Fever Active Problems:   Sepsis ([place])   Hepatitis C antibody positive in blood   Hyponatremia   Transaminitis   Consultations:  Gastroenterology  Infectious disease  Subjective: Feels great no complaints, low-grade fever overnight.  Discharge Exam: Vitals:   12/22/19 0451 12/22/19 0747  BP: 120/76 119/62  Pulse: (!) 114 95  Resp: 17 18  Temp: 99.6 F (37.6 C) 99.7 F (37.6 C)  SpO2: 94% 98%   Vitals:   12/21/19 2246 12/22/19 0215 12/22/19 0451 12/22/19 0747  BP: 128/73 116/75 120/76 119/62  Pulse: (!) 118 (!) 108 (!) 114  95  Resp: 18 17 17 18   Temp: 100.3 F (37.9 C) 100.2 F (37.9 C) 99.6 F (37.6 C) 99.7 F (37.6 C)  TempSrc:    Oral Oral  SpO2: 97% 94% 94% 98%  Weight:      Height:        General: Pt is alert, awake, not in acute distress Cardiovascular: RRR, S1/S2 +, no rubs, no gallops Respiratory: CTA bilaterally, no wheezing, no rhonchi Abdominal: Soft, NT, ND, bowel sounds + Extremities: no edema, no cyanosis  Discharge Instructions   Allergies as of 12/22/2019   No Known Allergies     Medication List    TAKE these medications   albuterol 108 (90 Base) MCG/ACT inhaler Commonly known as: VENTOLIN HFA Inhale 2 puffs into the lungs every 6 (six) hours as needed for wheezing or shortness of breath.   benzonatate 100 MG capsule Commonly known as: TESSALON Take 100-200 mg by mouth 3 (three) times daily as needed for cough.   pantoprazole 40 MG tablet Commonly known as: PROTONIX Take 1 tablet (40 mg total) by mouth daily before breakfast.       No Known Allergies  You were cared for by a hospitalist during your hospital stay. If you have any questions about your discharge medications or the care you received while you were in the hospital after you are discharged, you can call the unit and asked to speak with the hospitalist on call if the hospitalist that took care of you is not available. Once you are discharged, your primary care physician will handle any further medical issues. Please note that no refills for any discharge medications will be authorized once you are discharged, as it is imperative that you return to your primary care physician (or establish a relationship with a primary care physician if you do not have one) for your aftercare needs so that they can reassess your need for medications and monitor your lab values.   Procedures/Studies: CT Head Wo Contrast  Result Date: 12/15/2019 CLINICAL DATA:  Mental status change EXAM: CT HEAD WITHOUT CONTRAST TECHNIQUE: Contiguous axial images were obtained from the base of the skull through the vertex without intravenous contrast.  COMPARISON:  09/09/2019 head CT. FINDINGS: Brain: No acute infarct or intracranial hemorrhage. No mass lesion. No midline shift, ventriculomegaly or extra-axial fluid collection. Vascular: No hyperdense vessel or unexpected calcification. Skull: Negative for fracture or focal lesion. Sinuses/Orbits: Normal orbits. Clear paranasal sinuses. No mastoid effusion. Other: None. IMPRESSION: No acute intracranial process. Electronically Signed   By: Primitivo Gauze M.D.   On: 12/15/2019 14:21   MR BRAIN WO CONTRAST  Result Date: 12/16/2019 CLINICAL DATA:  Provided history: Mental status change, unknown cause. Additional provided: Altered and afebrile, tachycardic. EXAM: MRI HEAD WITHOUT CONTRAST TECHNIQUE: Multiplanar, multiecho pulse sequences of the brain and surrounding structures were obtained without intravenous contrast. COMPARISON:  Noncontrast head CT 12/15/2019. FINDINGS: Brain: The patient was unable to tolerate the full examination. As a result, only axial and coronal diffusion-weighted sequences, an axial T1 weighted sequence and an axial T2 weighted sequence could be obtained. The sagittal T1 weighted and axial T2 weighted sequences are moderately motion degraded. Cerebral volume is normal for age. There is no acute infarct. Within described limitations, there is no evidence of an intracranial mass. No extra-axial fluid collection is identified. No midline shift. Vascular: Expected proximal arterial flow voids. Skull and upper cervical spine: Within described limitations, no focal marrow lesion is identified. Sinuses/Orbits: Visualized orbits show no  acute finding. Mild ethmoid sinus mucosal thickening. Trace left mastoid effusion. IMPRESSION: Prematurely terminated and motion degraded examination as described. The diffusion-weighted imaging is of good quality. There is no evidence of acute infarct. Mild ethmoid sinus mucosal thickening. Trace left mastoid effusion. Electronically Signed   By: Kellie Simmering DO   On: 12/16/2019 10:29   DG Chest Port 1 View  Result Date: 12/15/2019 CLINICAL DATA:  Sepsis EXAM: PORTABLE CHEST 1 VIEW COMPARISON:  None. FINDINGS: The heart size and mediastinal contours are within normal limits. No focal airspace consolidation, pleural effusion, or pneumothorax. The visualized skeletal structures are unremarkable. IMPRESSION: No active disease. Electronically Signed   By: Davina Poke D.O.   On: 12/15/2019 14:03   US Abdomen Limited RUQ  Result Date: 12/15/2019 CLINICAL DATA:  Transaminitis EXAM: ULTRASOUND ABDOMEN LIMITED RIGHT UPPER QUADRANT COMPARISON:  None. FINDINGS: Gallbladder: No gallstones or wall thickening visualized. No sonographic Murphy sign noted by sonographer. Common bile duct: Diameter: 4 mm Liver: The liver is enlarged measuring 18.7 cm in the midclavicular line. There is diffuse increased liver echotexture consistent with hepatic steatosis. No focal liver abnormality. Portal vein is patent on color Doppler imaging with normal direction of blood flow towards the liver. Other: Right kidney is unremarkable measuring 12.4 cm. No free fluid. IMPRESSION: 1. Hepatomegaly, with diffuse hepatic steatosis. 2. Otherwise unremarkable exam. Electronically Signed   By: Randa Ngo M.D.   On: 12/15/2019 18:25      The results of significant diagnostics from this hospitalization (including imaging, microbiology, ancillary and laboratory) are listed below for reference.     Microbiology: Recent Results (from the past 240 hour(s))  Blood Culture (routine x 2)     Status: None   Collection Time: 12/15/19  1:32 PM   Specimen: BLOOD  Result Value Ref Range Status   Specimen Description BLOOD LEFT ANTECUBITAL  Final   Special Requests   Final    BOTTLES DRAWN AEROBIC AND ANAEROBIC Blood Culture results may not be optimal due to an inadequate volume of blood received in culture bottles   Culture   Final    NO GROWTH 5 DAYS Performed at [facility], [place] [address] [place], [place] [address]    Report Status 12/20/2019 FINAL  Final  Urine culture     Status: None   Collection Time: 12/15/19  1:32 PM   Specimen: In/Out Cath Urine  Result Value Ref Range Status   Specimen Description IN/OUT CATH URINE  Final   Special Requests NONE  Final   Culture   Final    NO GROWTH Performed at [facility], [place] [address] [place], [place] [address]    Report Status 12/16/2019 FINAL  Final  Blood Culture (routine x 2)     Status: None   Collection Time: 12/15/19  1:48 PM   Specimen: BLOOD RIGHT FOREARM  Result Value Ref Range Status   Specimen Description BLOOD RIGHT FOREARM  Final   Special Requests   Final    BOTTLES DRAWN AEROBIC AND ANAEROBIC Blood Culture results may not be optimal due to an inadequate volume of blood received in culture bottles   Culture   Final    NO GROWTH 5 DAYS Performed at [facility], [place] [address], [place], [place] [address]    Report Status 12/20/2019 FINAL  Final  SARS Coronavirus 2 by RT PCR (hospital order, performed in [facility] hospital lab) Nasopharyngeal Nasopharyngeal Swab  Status: None   Collection Time: 12/15/19  2:09 PM   Specimen: Nasopharyngeal Swab  Result Value Ref Range Status   SARS Coronavirus 2 NEGATIVE NEGATIVE Final    Comment: (NOTE) SARS-CoV-2 target nucleic acids are NOT DETECTED.  The SARS-CoV-2 RNA is generally detectable in upper and lower respiratory specimens during the acute phase of infection. The lowest concentration of SARS-CoV-2 viral copies this assay can detect is 250 copies / mL. A negative result does not preclude SARS-CoV-2 infection and should not be used as the sole basis for treatment or other patient management decisions.  A negative result may occur with improper specimen collection / handling, submission of specimen other than nasopharyngeal swab, presence of viral mutation(s) within the areas targeted by this assay, and inadequate  number of viral copies (<250 copies / mL). A negative result must be combined with clinical observations, patient history, and epidemiological information.  Fact Sheet for Patients:   StrictlyIdeas.no  Fact Sheet for Healthcare Providers: BankingDealers.co.za  This test is not yet approved or  cleared by the [place] FDA and has been authorized for detection and/or diagnosis of SARS-CoV-2 by FDA under an Emergency Use Authorization (EUA).  This EUA will remain in effect (meaning this test can be used) for the duration of the COVID-19 declaration under Section 564(b)(1) of the Act, 21 U.S.C. section 360bbb-3(b)(1), unless the authorization is terminated or revoked sooner.  Performed at [facility], [place] [address], [place], [place] [address]   Culture, blood (Routine X 2) w Reflex to ID Panel     Status: None (Preliminary result)   Collection Time: 12/21/19  2:58 PM   Specimen: BLOOD RIGHT ARM  Result Value Ref Range Status   Specimen Description BLOOD RIGHT ARM  Final   Special Requests   Final    BOTTLES DRAWN AEROBIC AND ANAEROBIC Blood Culture adequate volume   Culture   Final    NO GROWTH < 24 HOURS Performed at [facility], [place] [address] [place], [place] [address]    Report Status PENDING  Incomplete  Culture, blood (Routine X 2) w Reflex to ID Panel     Status: None (Preliminary result)   Collection Time: 12/21/19  2:58 PM   Specimen: BLOOD RIGHT ARM  Result Value Ref Range Status   Specimen Description BLOOD RIGHT ARM  Final   Special Requests   Final    BOTTLES DRAWN AEROBIC AND ANAEROBIC Blood Culture results may not be optimal due to an excessive volume of blood received in culture bottles   Culture   Final    NO GROWTH < 24 HOURS Performed at [facility], [place] [address], [place], [place] [address]    Report Status PENDING  Incomplete     Labs: BNP (last 3 results) No results for  input(s): BNP in the last 8760 hours. Basic Metabolic Panel: Recent Labs  Lab 12/17/19 0920 12/17/19 0920 12/18/19 4709 12/19/19 0412 12/20/19 0508 12/21/19 0455 12/22/19 0753  NA 134*   < > 135 134* 133* 136 133*  K 3.5   < > 3.4* 3.5 3.3* 4.3 3.6  CL 101   < > 102 104 102 101 98  CO2 24   < > 22 25 25 26 26   GLUCOSE 154*   < > 162* 150* 140* 146* 135*  BUN 9   < > 7 7 7 8 6   CREATININE 0.79   < > 0.82 0.74 0.78 0.85 0.82  CALCIUM 7.9*   < > 7.8* 7.7* 7.7* 7.9* 7.7*  MG 2.0  --  1.9 2.0  --   --   --   PHOS 3.3  --  2.7 2.4*  --   --   --    < > = values in this interval not displayed.   Liver Function Tests: Recent Labs  Lab 12/18/19 0811 12/19/19 0412 12/20/19 0508 12/21/19 0455 12/22/19 0753  AST 55* 71* 209* 93* 84*  ALT 184* 176* 283* 252* 223*  ALKPHOS 169* 172* 200* 225* 222*  BILITOT 0.3 0.2* 0.6 0.7 0.7  PROT 4.9* 5.1* 4.9* 4.9* 4.9*  ALBUMIN 1.7* 1.7* 1.6* 1.6* 1.6*   No results for input(s): LIPASE, AMYLASE in the last 168 hours. Recent Labs  Lab 12/15/19 1600  AMMONIA 25   CBC: Recent Labs  Lab 12/15/19 1332 12/15/19 1332 12/16/19 0351 12/17/19 0920 12/18/19 0811 12/19/19 0412 12/21/19 1450  WBC 9.2   < > 6.1 5.5 5.1 5.0 5.2  NEUTROABS 7.1  --   --   --   --   --  4.1  HGB 9.7*   < > 9.1* 8.9* 8.8* 8.9* 9.0*  HCT 32.3*   < > 29.9* 29.1* 29.0* 29.3* 28.8*  MCV 91.0   < > 90.3 89.5 89.0 88.8 88.1  PLT 246   < > 204 201 196 195 224   < > = values in this interval not displayed.   Cardiac Enzymes: No results for input(s): CKTOTAL, CKMB, CKMBINDEX, TROPONINI in the last 168 hours. BNP: Invalid input(s): POCBNP CBG: No results for input(s): GLUCAP in the last 168 hours. D-Dimer No results for input(s): DDIMER in the last 72 hours. Hgb A1c No results for input(s): HGBA1C in the last 72 hours. Lipid Profile No results for input(s): CHOL, HDL, LDLCALC, TRIG, CHOLHDL, LDLDIRECT in the last 72 hours. Thyroid function studies No results for  input(s): TSH, T4TOTAL, T3FREE, THYROIDAB in the last 72 hours.  Invalid input(s): FREET3 Anemia work up Recent Labs    12/20/19 1229  FERRITIN 2,845*  TIBC 188*  IRON 12*   Urinalysis    Component Value Date/Time   COLORURINE YELLOW 12/21/2019 1721   APPEARANCEUR CLEAR 12/21/2019 1721   LABSPEC 1.011 12/21/2019 1721   PHURINE 6.0 12/21/2019 1721   GLUCOSEU 50 (A) 12/21/2019 1721   HGBUR SMALL (A) 12/21/2019 1721   BILIRUBINUR NEGATIVE 12/21/2019 1721   KETONESUR NEGATIVE 12/21/2019 1721   PROTEINUR NEGATIVE 12/21/2019 1721   NITRITE NEGATIVE 12/21/2019 1721   LEUKOCYTESUR NEGATIVE 12/21/2019 1721   Sepsis Labs Invalid input(s): PROCALCITONIN,  WBC,  LACTICIDVEN Microbiology Recent Results (from the past 240 hour(s))  Blood Culture (routine x 2)     Status: None   Collection Time: 12/15/19  1:32 PM   Specimen: BLOOD  Result Value Ref Range Status   Specimen Description BLOOD LEFT ANTECUBITAL  Final   Special Requests   Final    BOTTLES DRAWN AEROBIC AND ANAEROBIC Blood Culture results may not be optimal due to an inadequate volume of blood received in culture bottles   Culture   Final    NO GROWTH 5 DAYS Performed at [facility], [place] [address], [place], [place] [address]    Report Status 12/20/2019 FINAL  Final  Urine culture     Status: None   Collection Time: 12/15/19  1:32 PM   Specimen: In/Out Cath Urine  Result Value Ref Range Status   Specimen Description IN/OUT CATH  URINE  Final   Special Requests NONE  Final   Culture   Final    NO GROWTH Performed at [facility], [place] [address], [place], [place] [address]    Report Status 12/16/2019 FINAL  Final  Blood Culture (routine x 2)     Status: None   Collection Time: 12/15/19  1:48 PM   Specimen: BLOOD RIGHT FOREARM  Result Value Ref Range Status   Specimen Description BLOOD RIGHT FOREARM  Final   Special Requests   Final    BOTTLES DRAWN AEROBIC AND ANAEROBIC Blood Culture results may not be  optimal due to an inadequate volume of blood received in culture bottles   Culture   Final    NO GROWTH 5 DAYS Performed at [facility], [place] [address] [place], [place] [address]    Report Status 12/20/2019 FINAL  Final  SARS Coronavirus 2 by RT PCR (hospital order, performed in [facility] hospital lab) Nasopharyngeal Nasopharyngeal Swab     Status: None   Collection Time: 12/15/19  2:09 PM   Specimen: Nasopharyngeal Swab  Result Value Ref Range Status   SARS Coronavirus 2 NEGATIVE NEGATIVE Final    Comment: (NOTE) SARS-CoV-2 target nucleic acids are NOT DETECTED.  The SARS-CoV-2 RNA is generally detectable in upper and lower respiratory specimens during the acute phase of infection. The lowest concentration of SARS-CoV-2 viral copies this assay can detect is 250 copies / mL. A negative result does not preclude SARS-CoV-2 infection and should not be used as the sole basis for treatment or other patient management decisions.  A negative result may occur with improper specimen collection / handling, submission of specimen other than nasopharyngeal swab, presence of viral mutation(s) within the areas targeted by this assay, and inadequate number of viral copies (<250 copies / mL). A negative result must be combined with clinical observations, patient history, and epidemiological information.  Fact Sheet for Patients:   StrictlyIdeas.no  Fact Sheet for Healthcare Providers: BankingDealers.co.za  This test is not yet approved or  cleared by the [place] FDA and has been authorized for detection and/or diagnosis of SARS-CoV-2 by FDA under an Emergency Use Authorization (EUA).  This EUA will remain in effect (meaning this test can be used) for the duration of the COVID-19 declaration under Section 564(b)(1) of the Act, 21 U.S.C. section 360bbb-3(b)(1), unless the authorization is terminated or revoked sooner.  Performed at  [facility], [place] [address], [place], [place] [address]   Culture, blood (Routine X 2) w Reflex to ID Panel     Status: None (Preliminary result)   Collection Time: 12/21/19  2:58 PM   Specimen: BLOOD RIGHT ARM  Result Value Ref Range Status   Specimen Description BLOOD RIGHT ARM  Final   Special Requests   Final    BOTTLES DRAWN AEROBIC AND ANAEROBIC Blood Culture adequate volume   Culture   Final    NO GROWTH < 24 HOURS Performed at [facility], [place] [address], [place], [place] [address]    Report Status PENDING  Incomplete  Culture, blood (Routine X 2) w Reflex to ID Panel     Status: None (Preliminary result)   Collection Time: 12/21/19  2:58 PM   Specimen: BLOOD RIGHT ARM  Result Value Ref Range Status   Specimen Description BLOOD RIGHT ARM  Final   Special Requests   Final    BOTTLES DRAWN AEROBIC AND ANAEROBIC Blood Culture results may  not be optimal due to an excessive volume of blood received in culture bottles   Culture   Final    NO GROWTH < 24 HOURS Performed at [place] [address], [place], [place] [address]    Report Status PENDING  Incomplete     Time coordinating discharge:  I have spent 35 minutes face to face with the patient and on the ward discussing the patients care, assessment, plan and disposition with other care givers. >50% of the time was devoted counseling the patient about the risks and benefits of treatment/Discharge disposition and coordinating care.   SIGNED:   Damita Lack, MD  [facility] 12/22/2019, 1:06 PM   If 7PM-7AM, please contact night-coverage

## 2019-12-22 NOTE — Progress Notes (Signed)
IR received request for liver biopsy - this is unable to be accommodated today. Patient offered the choice to stay another night and have procedure done tomorrow - patient declined and said he would rather have procedure done as an outpatient. Patient will need an order placed for outpatient liver biopsy. Dr. Reesa Chew and Dr. Benson Norway notified via Epic message.  Please call IR with any questions.  Soyla Dryer, [place] [phone] 12/22/2019, 3:28 PM

## 2019-12-22 NOTE — Progress Notes (Signed)
Brian Ochoa to be discharged Home per MD order. Discussed prescriptions and follow up appointments with the patient. Prescriptions given to patient; medication list explained in detail. Patient verbalized understanding.  Skin clean, dry and intact without evidence of skin break down, no evidence of skin tears noted. IV catheter discontinued intact. Site without signs and symptoms of complications. Dressing and pressure applied. Pt denies pain at the site currently. No complaints noted.  Patient free of lines, drains, and wounds.   An After Visit Summary (AVS) was printed and given to the patient. Patient escorted via wheelchair, and discharged home via private auto.  [name], RN

## 2019-12-23 LAB — HIV-1 RNA QUANT-NO REFLEX-BLD
HIV 1 RNA Quant: <20 copies/mL
LOG10 HIV-1 RNA: UNDETERMINED log10copy/mL

## 2019-12-26 LAB — CULTURE, BLOOD (ROUTINE X 2)
Culture: NO GROWTH
Culture: NO GROWTH
Special Requests: ADEQUATE

## 2019-12-30 ENCOUNTER — Other Ambulatory Visit: Payer: Self-pay

## 2019-12-30 ENCOUNTER — Encounter: Payer: Self-pay | Admitting: Family

## 2019-12-30 ENCOUNTER — Ambulatory Visit (INDEPENDENT_AMBULATORY_CARE_PROVIDER_SITE_OTHER): Payer: Self-pay | Admitting: Family

## 2019-12-30 VITALS — BP 122/77 | HR 111 | Temp 97.4°F | Wt 179.0 lb

## 2019-12-30 DIAGNOSIS — R509 Fever, unspecified: Secondary | ICD-10-CM

## 2019-12-30 DIAGNOSIS — R590 Localized enlarged lymph nodes: Secondary | ICD-10-CM | POA: Insufficient documentation

## 2019-12-30 DIAGNOSIS — R7401 Elevation of levels of liver transaminase levels: Secondary | ICD-10-CM

## 2019-12-30 NOTE — Assessment & Plan Note (Signed)
Brian Ochoa had transaminitis while hospitalized with no recent blood work and unclear origin with concern for possible doxycycline induced liver injury.  All other testing has been unremarkable including hepatitis and viral serologies.  Scheduled for office follow-up with gastroenterology for possible liver biopsy.  Check blood work today.

## 2019-12-30 NOTE — Patient Instructions (Signed)
Nice to see you.  We will check your lab work and order an ultrasound of your neck  Continue to follow up with Dr. Benson Norway.  Plan for follow up in 1 month or sooner if needed.   Have a great day and stay safe!

## 2019-12-30 NOTE — Progress Notes (Signed)
Subjective:    Patient ID: Brian Ochoa, male    DOB: 05-Sep-1960, 59 y.o.   MRN: [id number]  Chief Complaint  Patient presents with  . Hospitalization Follow-up    Has concerns about husband's wellbeing, wife described patient as being disoriented, and is having trouble being balanced. Wife is rather concerned and aren't even sure why they were in the hospital the hospital was very unclear with them.     HPI:  Brian Ochoa is a 59 y.o. male with previous medical history presented below who was admitted to the hospital with altered mental status and fever.  Upper respiratory infection symptoms started 3 weeks prior to admission and was given doxycycline and steroids by his PCP.  Initially started on broad-spectrum antimicrobial therapy with concern for sepsis.  Blood work was negative with the exception of elevation in transaminases.  Brian Ochoa had a history of hepatitis C in the past but was successfully treated with sustained viremic response.  Hepatitis serologies as well as CMV and Epstein-Barr were negative.  Antimicrobial therapy was stopped and he continued to have intermittent fevers and transaminitis.  Transaminitis gradually improved and he was scheduled for liver biopsy with gastroenterology prior to leaving the hospital.  Here today for hospital follow-up.  Brian Ochoa has been having waxing/waning spells since leaving the hospital of sweating and weakness.  Family describe that he is debilitated at times.  This last spell was yesterday and he is significantly improved today.  He has had episodes of falling.  Has also been experiencing headaches as well as left-sided neck discomfort.  Has had chills but denies measurable fever.  No Known Allergies    Outpatient Medications Prior to Visit  Medication Sig Dispense Refill  . albuterol (VENTOLIN HFA) 108 (90 Base) MCG/ACT inhaler Inhale 2 puffs into the lungs every 6 (six) hours as needed for wheezing or shortness of breath.      . benzonatate (TESSALON) 100 MG capsule Take 100-200 mg by mouth 3 (three) times daily as needed for cough.    . pantoprazole (PROTONIX) 40 MG tablet Take 1 tablet (40 mg total) by mouth daily before breakfast. 30 tablet 0   No facility-administered medications prior to visit.     Past Medical History:  Diagnosis Date  . TIA (transient ischemic attack)      History reviewed. No pertinent surgical history.     Review of Systems  Constitutional: Positive for chills, diaphoresis and unexpected weight change. Negative for fatigue and fever.  HENT:       Positive for neck swelling.   Respiratory: Negative for cough, chest tightness, shortness of breath and wheezing.   Cardiovascular: Negative for chest pain.  Gastrointestinal: Positive for abdominal pain (occasional). Negative for abdominal distention, constipation, diarrhea, nausea and vomiting.  Neurological: Positive for weakness and headaches.  Hematological: Does not bruise/bleed easily.      Objective:    BP 122/77   Pulse (!) 111   Temp (!) 97.4 F (36.3 C)   Wt 179 lb (81.2 kg)   SpO2 100%   BMI 25.68 kg/m  Nursing note and vital signs reviewed.  Physical Exam Constitutional:      General: He is not in acute distress.    Appearance: He is well-developed. He is ill-appearing. He is not diaphoretic.     Comments: Seated in the chair; pleasant  Cardiovascular:     Rate and Rhythm: Regular rhythm. Tachycardia present.     Heart sounds: Normal heart sounds.  Pulmonary:     Effort: Pulmonary effort is normal.     Breath sounds: Normal breath sounds.  Lymphadenopathy:     Head:     Left side of head: Tonsillar adenopathy present. No submental or submandibular adenopathy.     Cervical: Cervical adenopathy present.     Left cervical: Superficial cervical adenopathy present.  Skin:    General: Skin is warm and dry.     Coloration: Skin is pale.  Neurological:     Mental Status: He is alert and oriented to  person, place, and time.  Psychiatric:        Mood and Affect: Mood normal.        Behavior: Behavior normal.        Thought Content: Thought content normal.        Judgment: Judgment normal.      Depression screen PHQ 2/9 12/30/2019  Decreased Interest 0  Down, Depressed, Hopeless 0  PHQ - 2 Score 0       Assessment & Plan:    Patient Active Problem List   Diagnosis Date Noted  . Hepatitis C antibody positive in blood 12/15/2019    Priority: Low  . Lymphadenopathy of head and neck region 12/30/2019  . Fever   . Sepsis ([place]) 12/15/2019  . Hyponatremia 12/15/2019  . Transaminitis 12/15/2019     Problem List Items Addressed This Visit      Immune and Lymphatic   Lymphadenopathy of head and neck region    Mr. Fleet has new onset left neck and cervical lymphadenopathy concerning for possible malignancy given his other symptoms including weakness, night sweats, and weight loss.  There does not appear to be any infectious cause of his symptoms at present.  Could consider an orthopantogram to evaluate his teeth.  Will order ultrasound for initial evaluation.  Discussed possible need for additional testing and/or biopsy depending upon results.      Relevant Orders   COMPLETE METABOLIC PANEL WITH GFR   CBC w/Diff   US SOFT TISSUE HEAD & NECK (NON-THYROID)     Other   Transaminitis - Primary    Brian Ochoa had transaminitis while hospitalized with no recent blood work and unclear origin with concern for possible doxycycline induced liver injury.  All other testing has been unremarkable including hepatitis and viral serologies.  Scheduled for office follow-up with gastroenterology for possible liver biopsy.  Check blood work today.      Relevant Orders   COMPLETE METABOLIC PANEL WITH GFR   CBC w/Diff   US SOFT TISSUE HEAD & NECK (NON-THYROID)   Fever    Brian Ochoa has no reported fevers since leaving the hospital although has had chills which indicate he may have had fevers  at times.  Differential remains broad although does not appear to be infectious at the present time.  We will continue work-up.          I am having Maryella Shivers "Butch" maintain his benzonatate, albuterol, and pantoprazole.   Follow-up: Return in about 1 month (around 01/30/2020), or if symptoms worsen or fail to improve.   Terri Piedra, MSN, FNP-C Nurse Practitioner [facility] [place] number: [phone]

## 2019-12-30 NOTE — Assessment & Plan Note (Signed)
Mr. Schreck has no reported fevers since leaving the hospital although has had chills which indicate he may have had fevers at times.  Differential remains broad although does not appear to be infectious at the present time.  We will continue work-up.

## 2019-12-30 NOTE — Assessment & Plan Note (Signed)
Mr. Goyer has new onset left neck and cervical lymphadenopathy concerning for possible malignancy given his other symptoms including weakness, night sweats, and weight loss.  There does not appear to be any infectious cause of his symptoms at present.  Could consider an orthopantogram to evaluate his teeth.  Will order ultrasound for initial evaluation.  Discussed possible need for additional testing and/or biopsy depending upon results.

## 2019-12-31 ENCOUNTER — Ambulatory Visit
Admission: RE | Admit: 2019-12-31 | Discharge: 2019-12-31 | Disposition: A | Discharge status: Home / Self Care | Payer: No Typology Code available for payment source | Source: Ambulatory Visit | Attending: Family | Admitting: Family

## 2019-12-31 ENCOUNTER — Telehealth: Payer: Self-pay

## 2019-12-31 DIAGNOSIS — R590 Localized enlarged lymph nodes: Secondary | ICD-10-CM

## 2019-12-31 DIAGNOSIS — R7401 Elevation of levels of liver transaminase levels: Secondary | ICD-10-CM

## 2019-12-31 LAB — COMPLETE METABOLIC PANEL WITH GFR
AG Ratio: 0.9 (calc) — ABNORMAL LOW (ref 1.0–2.5)
ALT: 281 U/L — ABNORMAL HIGH (ref 9–46)
AST: 132 U/L — ABNORMAL HIGH (ref 10–35)
Albumin: 2.8 g/dL — ABNORMAL LOW (ref 3.6–5.1)
Alkaline phosphatase (APISO): 358 U/L — ABNORMAL HIGH (ref 35–144)
BUN: 13 mg/dL (ref 7–25)
CO2: 26 mmol/L (ref 20–32)
Calcium: 8.4 mg/dL — ABNORMAL LOW (ref 8.6–10.3)
Chloride: 93 mmol/L — ABNORMAL LOW (ref 98–110)
Creat: 0.76 mg/dL (ref 0.70–1.33)
GFR, Est African American: 117 mL/min/{1.73_m2} (ref >=60)
GFR, Est Non African American: 101 mL/min/{1.73_m2} (ref >=60)
Globulin: 3.1 g/dL (calc) (ref 1.9–3.7)
Glucose, Bld: 127 mg/dL — ABNORMAL HIGH (ref 65–99)
Potassium: 4 mmol/L (ref 3.5–5.3)
Sodium: 131 mmol/L — ABNORMAL LOW (ref 135–146)
Total Bilirubin: 1.3 mg/dL — ABNORMAL HIGH (ref 0.2–1.2)
Total Protein: 5.9 g/dL — ABNORMAL LOW (ref 6.1–8.1)

## 2019-12-31 LAB — CBC WITH DIFFERENTIAL/PLATELET
Absolute Monocytes: 1290 cells/uL — ABNORMAL HIGH (ref 200–950)
Basophils Absolute: 10 cells/uL (ref 0–200)
Basophils Relative: 0.1 %
Eosinophils Absolute: 0 cells/uL — ABNORMAL LOW (ref 15–500)
Eosinophils Relative: 0 %
HCT: 29.3 % — ABNORMAL LOW (ref 38.5–50.0)
Hemoglobin: 9.7 g/dL — ABNORMAL LOW (ref 13.2–17.1)
Lymphs Abs: 690 cells/uL — ABNORMAL LOW (ref 850–3900)
MCH: 25.6 pg — ABNORMAL LOW (ref 27.0–33.0)
MCHC: 33.1 g/dL (ref 32.0–36.0)
MCV: 77.3 fL — ABNORMAL LOW (ref 80.0–100.0)
MPV: 10.2 fL (ref 7.5–12.5)
Monocytes Relative: 12.9 %
Neutro Abs: 8010 cells/uL — ABNORMAL HIGH (ref 1500–7800)
Neutrophils Relative %: 80.1 %
Platelets: 329 10*3/uL (ref 140–400)
RBC: 3.79 10*6/uL — ABNORMAL LOW (ref 4.20–5.80)
RDW: 15.3 % — ABNORMAL HIGH (ref 11.0–15.0)
Total Lymphocyte: 6.9 %
WBC: 10 10*3/uL (ref 3.8–10.8)

## 2019-12-31 IMAGING — US US SOFT TISSUE HEAD/NECK
1 series · 14 of 25 positions shown · non-contrast
Comparison: None.

CLINICAL DATA: Left-sided cervical lymphadenopathy. Weakness and
weight loss.

EXAM:
ULTRASOUND OF HEAD/NECK SOFT TISSUES
TECHNIQUE: Ultrasound examination of the head and neck soft tissues was
performed in the area of clinical concern.

[Series 1: us soft tissue head/neck · 0.07mm/px · 36 acquisitions, 14 frames shown]
[im 1/36]
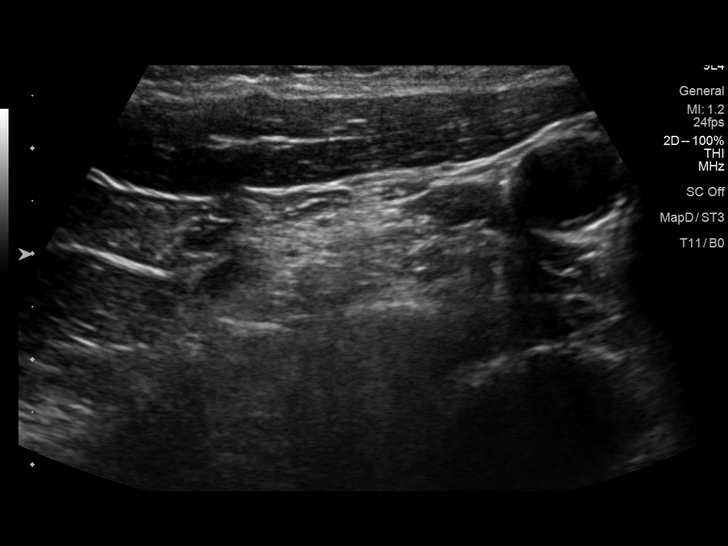
[im 3/36]
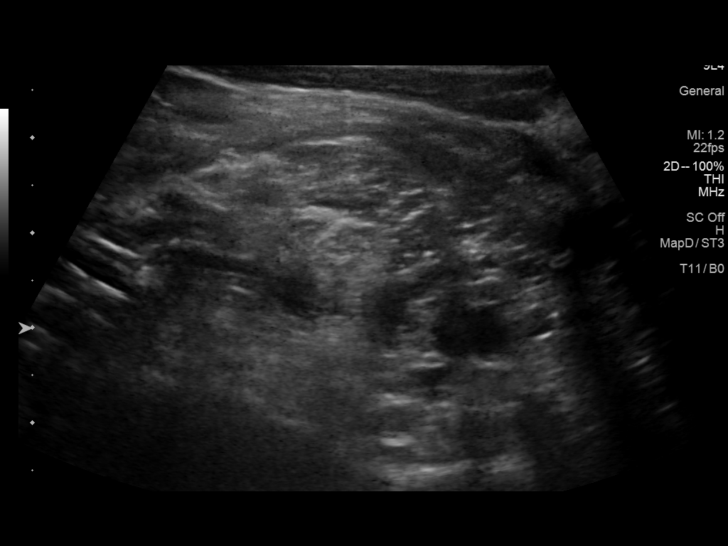
[im 6/36]
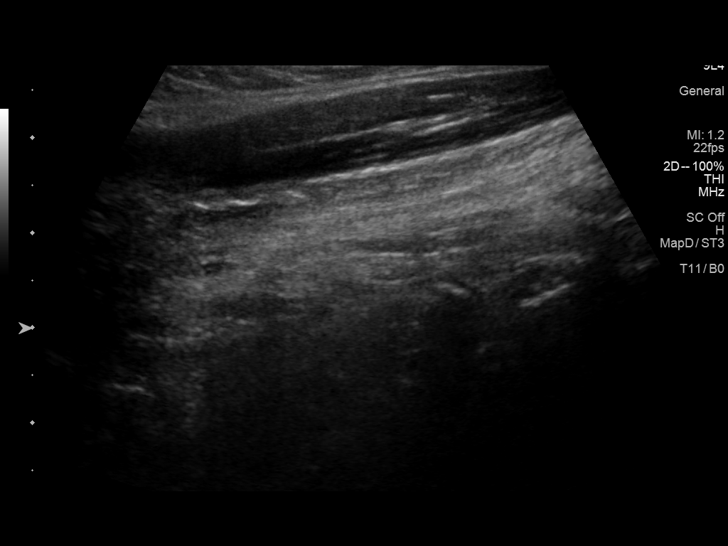
[im 9/36]
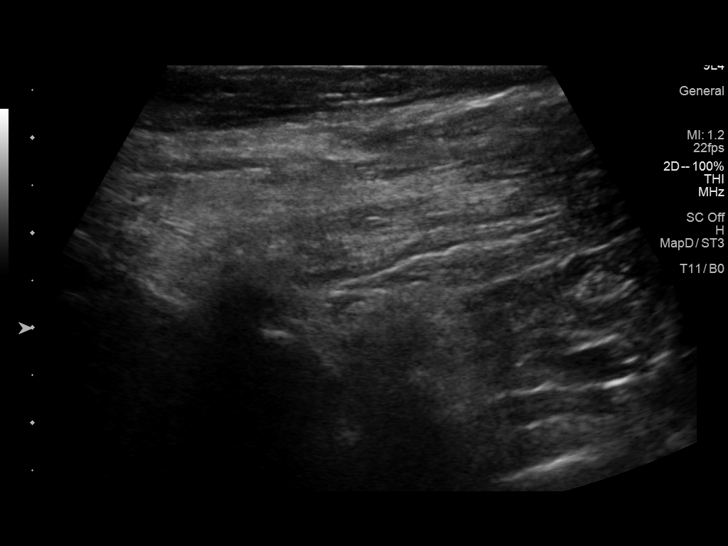
[im 12/36]
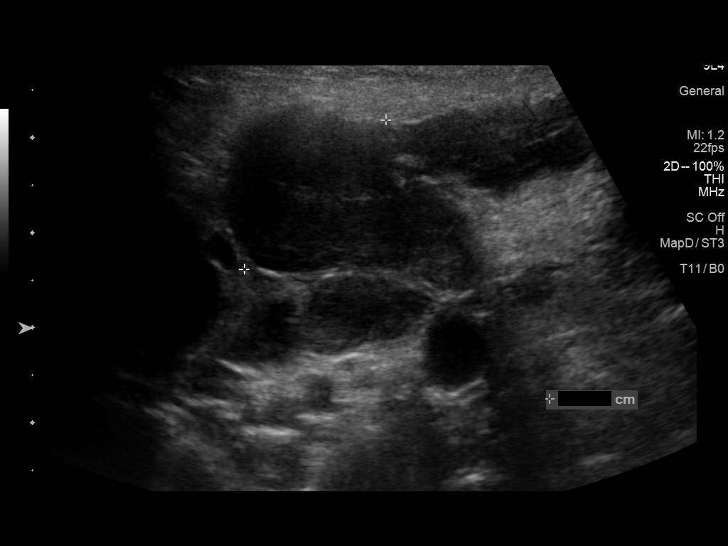
[im 14/36]
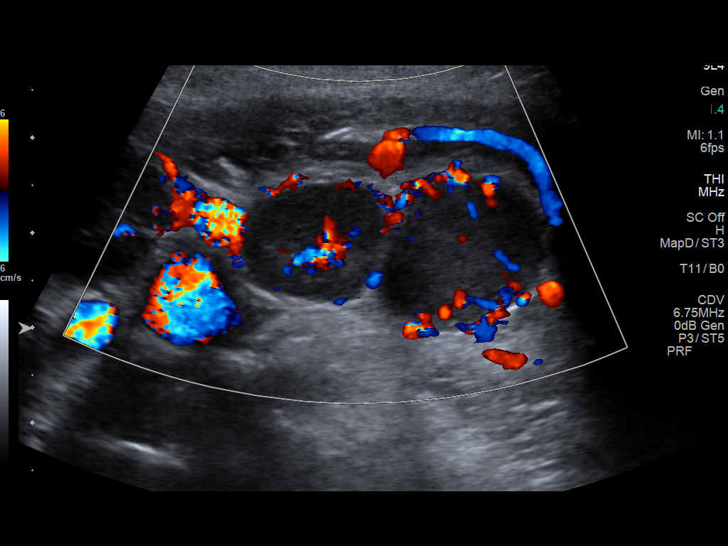
[im 17/36]
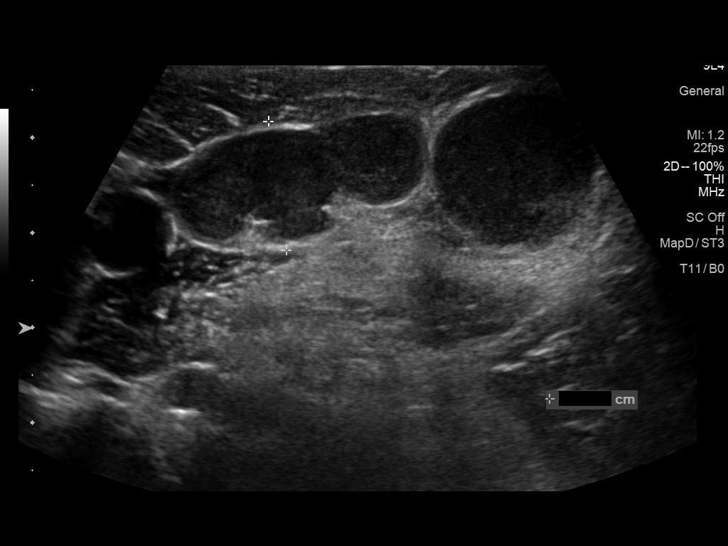
[im 19/36]
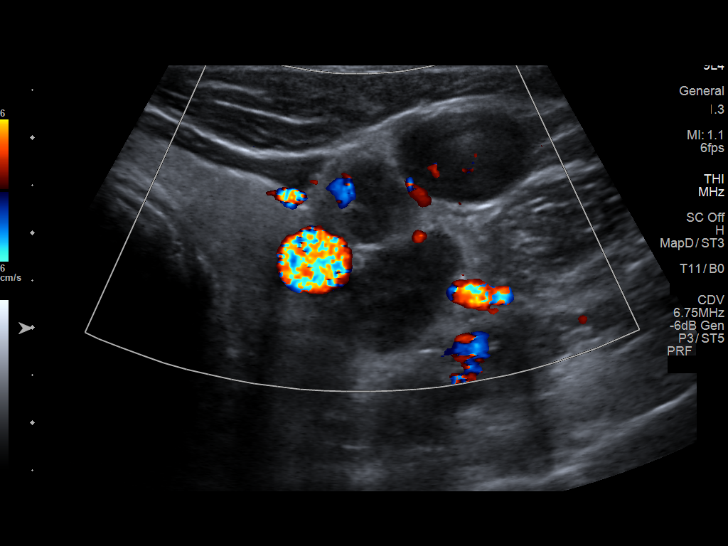
[im 22/36]
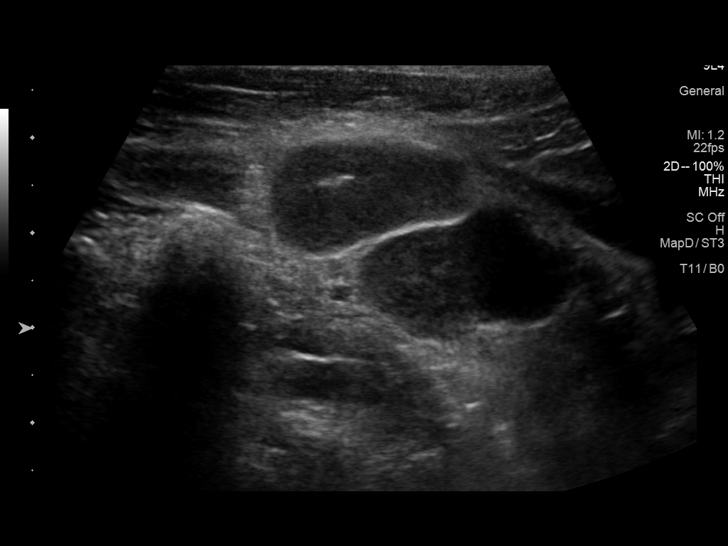
[im 24/36]
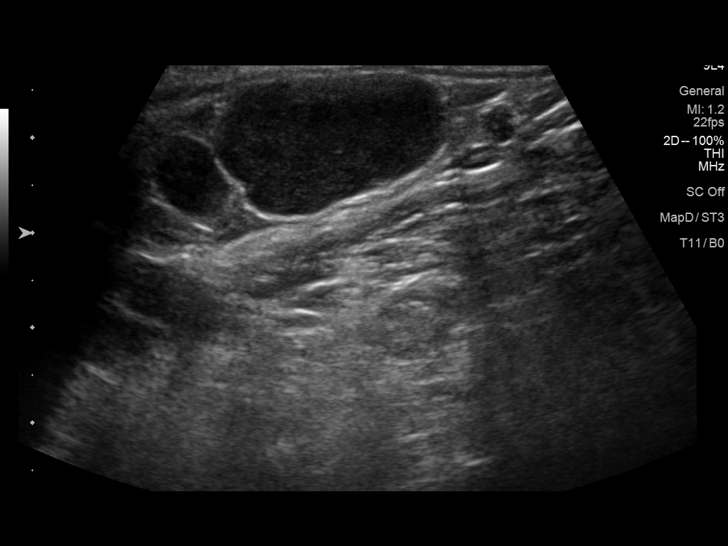
[im 27/36]
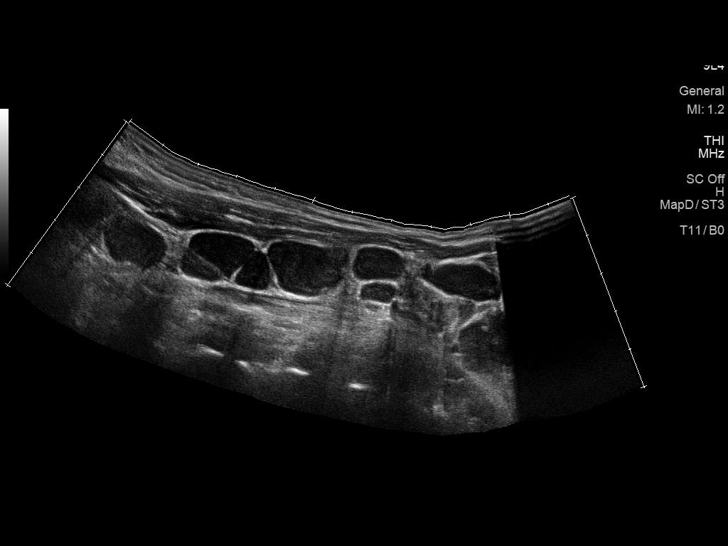
[im 30/36]
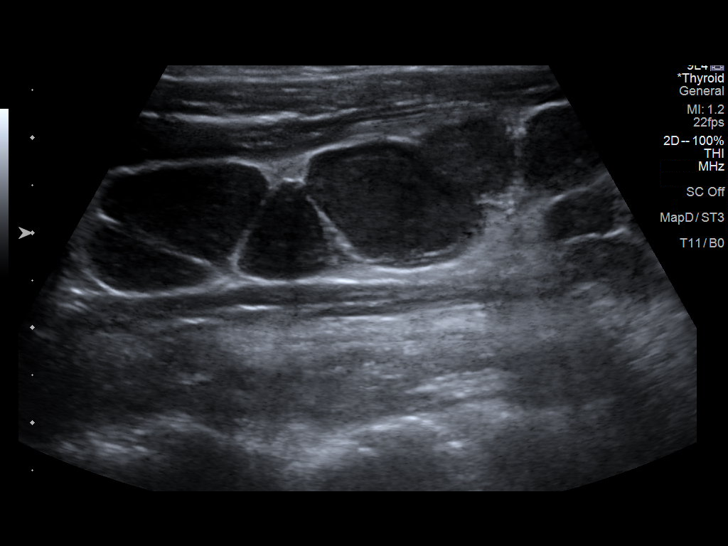
[im 33/36]
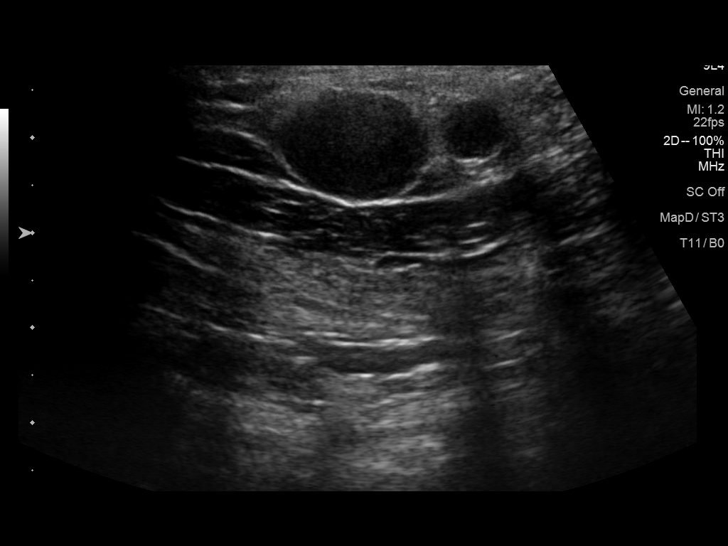
[im 36/36]
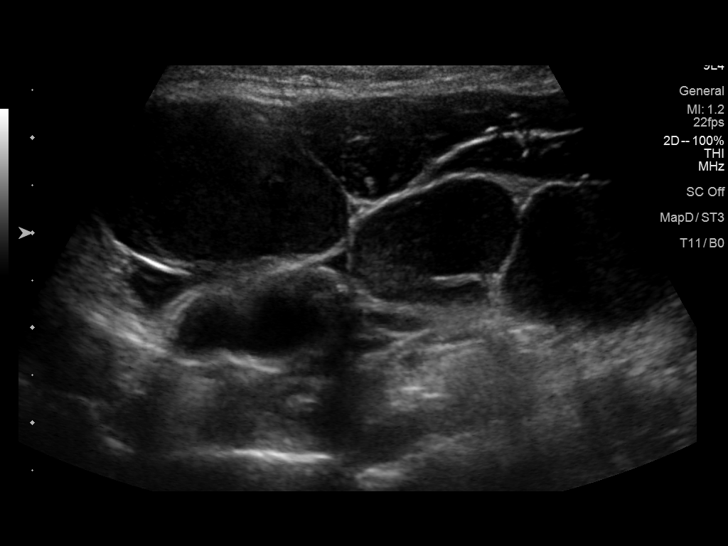

[14 of 25 positions shown; findings below may reference images not displayed]

FINDINGS: Patient's palpable area of concern within the left neck correlates
with several clustered potentially pathologically enlarged cervical
lymph nodes (representative image 28).

Dominant left-sided cervical lymph node measures 1.4 cm in greatest
short axis diameter with obliteration of the fatty hilum (image 30).
IMPRESSION: Patient's palpable area of concern within the left neck correlates
with left cervical lymphadenopathy, nonspecific and while
potentially infectious or inflammatory in etiology, malignancy is
not excluded on the basis of this examination. Clinical correlation
is advised. Further evaluation with contrast-enhanced neck CT could
be performed as indicated.

These results will be called to the ordering clinician or
representative by the Radiologist Assistant, and communication
documented in the PACS or [REDACTED].

## 2019-12-31 NOTE — Telephone Encounter (Signed)
Malachy Mood from [facility] called to relay ultrasound findings. RN notified Terri Piedra, NP that results are available in patient's chart. NP aware.   Beryle Flock, RN

## 2020-01-01 ENCOUNTER — Encounter (HOSPITAL_COMMUNITY): Payer: Self-pay | Admitting: Radiology

## 2020-01-01 ENCOUNTER — Emergency Department (HOSPITAL_COMMUNITY): Payer: Worker's Compensation

## 2020-01-01 ENCOUNTER — Other Ambulatory Visit: Payer: Self-pay

## 2020-01-01 ENCOUNTER — Inpatient Hospital Stay (HOSPITAL_COMMUNITY): Payer: Worker's Compensation

## 2020-01-01 ENCOUNTER — Inpatient Hospital Stay (HOSPITAL_COMMUNITY)
Admission: EM | Admit: 2020-01-01 | Discharge: 2020-01-07 | DRG: 040 | Disposition: A | Discharge status: Hospice | Payer: No Typology Code available for payment source | Attending: Internal Medicine | Admitting: Internal Medicine

## 2020-01-01 DIAGNOSIS — Z8249 Family history of ischemic heart disease and other diseases of the circulatory system: Secondary | ICD-10-CM

## 2020-01-01 DIAGNOSIS — F17290 Nicotine dependence, other tobacco product, uncomplicated: Secondary | ICD-10-CM | POA: Diagnosis present

## 2020-01-01 DIAGNOSIS — Z515 Encounter for palliative care: Secondary | ICD-10-CM

## 2020-01-01 DIAGNOSIS — R918 Other nonspecific abnormal finding of lung field: Secondary | ICD-10-CM | POA: Diagnosis present

## 2020-01-01 DIAGNOSIS — G9341 Metabolic encephalopathy: Secondary | ICD-10-CM | POA: Diagnosis present

## 2020-01-01 DIAGNOSIS — D649 Anemia, unspecified: Secondary | ICD-10-CM

## 2020-01-01 DIAGNOSIS — C7949 Secondary malignant neoplasm of other parts of nervous system: Principal | ICD-10-CM | POA: Diagnosis present

## 2020-01-01 DIAGNOSIS — R410 Disorientation, unspecified: Secondary | ICD-10-CM | POA: Diagnosis not present

## 2020-01-01 DIAGNOSIS — Z20822 Contact with and (suspected) exposure to covid-19: Secondary | ICD-10-CM | POA: Diagnosis present

## 2020-01-01 DIAGNOSIS — I959 Hypotension, unspecified: Secondary | ICD-10-CM | POA: Diagnosis present

## 2020-01-01 DIAGNOSIS — B1920 Unspecified viral hepatitis C without hepatic coma: Secondary | ICD-10-CM | POA: Diagnosis present

## 2020-01-01 DIAGNOSIS — C801 Malignant (primary) neoplasm, unspecified: Secondary | ICD-10-CM | POA: Diagnosis present

## 2020-01-01 DIAGNOSIS — K760 Fatty (change of) liver, not elsewhere classified: Secondary | ICD-10-CM | POA: Diagnosis present

## 2020-01-01 DIAGNOSIS — Z66 Do not resuscitate: Secondary | ICD-10-CM | POA: Diagnosis not present

## 2020-01-01 DIAGNOSIS — Z7982 Long term (current) use of aspirin: Secondary | ICD-10-CM

## 2020-01-01 DIAGNOSIS — R768 Other specified abnormal immunological findings in serum: Secondary | ICD-10-CM | POA: Diagnosis present

## 2020-01-01 DIAGNOSIS — C773 Secondary and unspecified malignant neoplasm of axilla and upper limb lymph nodes: Secondary | ICD-10-CM | POA: Diagnosis present

## 2020-01-01 DIAGNOSIS — R4781 Slurred speech: Secondary | ICD-10-CM | POA: Diagnosis not present

## 2020-01-01 DIAGNOSIS — Z8673 Personal history of transient ischemic attack (TIA), and cerebral infarction without residual deficits: Secondary | ICD-10-CM

## 2020-01-01 DIAGNOSIS — R590 Localized enlarged lymph nodes: Secondary | ICD-10-CM

## 2020-01-01 DIAGNOSIS — W19XXXA Unspecified fall, initial encounter: Secondary | ICD-10-CM | POA: Diagnosis present

## 2020-01-01 DIAGNOSIS — R4182 Altered mental status, unspecified: Secondary | ICD-10-CM

## 2020-01-01 DIAGNOSIS — K4090 Unilateral inguinal hernia, without obstruction or gangrene, not specified as recurrent: Secondary | ICD-10-CM

## 2020-01-01 DIAGNOSIS — G9340 Encephalopathy, unspecified: Secondary | ICD-10-CM | POA: Diagnosis not present

## 2020-01-01 DIAGNOSIS — R591 Generalized enlarged lymph nodes: Secondary | ICD-10-CM

## 2020-01-01 DIAGNOSIS — K219 Gastro-esophageal reflux disease without esophagitis: Secondary | ICD-10-CM | POA: Diagnosis present

## 2020-01-01 DIAGNOSIS — K5900 Constipation, unspecified: Secondary | ICD-10-CM | POA: Diagnosis present

## 2020-01-01 DIAGNOSIS — R509 Fever, unspecified: Secondary | ICD-10-CM | POA: Diagnosis present

## 2020-01-01 DIAGNOSIS — D630 Anemia in neoplastic disease: Secondary | ICD-10-CM | POA: Diagnosis present

## 2020-01-01 DIAGNOSIS — K7689 Other specified diseases of liver: Secondary | ICD-10-CM | POA: Diagnosis present

## 2020-01-01 DIAGNOSIS — R0603 Acute respiratory distress: Secondary | ICD-10-CM

## 2020-01-01 DIAGNOSIS — C8595 Non-Hodgkin lymphoma, unspecified, lymph nodes of inguinal region and lower limb: Secondary | ICD-10-CM

## 2020-01-01 DIAGNOSIS — R000 Tachycardia, unspecified: Secondary | ICD-10-CM | POA: Diagnosis present

## 2020-01-01 DIAGNOSIS — C799 Secondary malignant neoplasm of unspecified site: Secondary | ICD-10-CM

## 2020-01-01 DIAGNOSIS — R7989 Other specified abnormal findings of blood chemistry: Secondary | ICD-10-CM

## 2020-01-01 DIAGNOSIS — R162 Hepatomegaly with splenomegaly, not elsewhere classified: Secondary | ICD-10-CM | POA: Diagnosis present

## 2020-01-01 DIAGNOSIS — Z79899 Other long term (current) drug therapy: Secondary | ICD-10-CM | POA: Diagnosis not present

## 2020-01-01 DIAGNOSIS — H04123 Dry eye syndrome of bilateral lacrimal glands: Secondary | ICD-10-CM | POA: Diagnosis present

## 2020-01-01 DIAGNOSIS — Z8619 Personal history of other infectious and parasitic diseases: Secondary | ICD-10-CM | POA: Diagnosis not present

## 2020-01-01 DIAGNOSIS — Z7189 Other specified counseling: Secondary | ICD-10-CM

## 2020-01-01 DIAGNOSIS — R339 Retention of urine, unspecified: Secondary | ICD-10-CM | POA: Diagnosis present

## 2020-01-01 LAB — COMPREHENSIVE METABOLIC PANEL
ALT: 209 U/L — ABNORMAL HIGH (ref 0–44)
AST: 134 U/L — ABNORMAL HIGH (ref 15–41)
Albumin: 1.7 g/dL — ABNORMAL LOW (ref 3.5–5.0)
Alkaline Phosphatase: 289 U/L — ABNORMAL HIGH (ref 38–126)
Anion gap: 10 (ref 5–15)
BUN: 14 mg/dL (ref 6–20)
CO2: 25 mmol/L (ref 22–32)
Calcium: 7.9 mg/dL — ABNORMAL LOW (ref 8.9–10.3)
Chloride: 99 mmol/L (ref 98–111)
Creatinine, Ser: 0.88 mg/dL (ref 0.61–1.24)
GFR, Estimated: >60 mL/min (ref >60)
Glucose, Bld: 138 mg/dL — ABNORMAL HIGH (ref 70–99)
Potassium: 3.7 mmol/L (ref 3.5–5.1)
Sodium: 134 mmol/L — ABNORMAL LOW (ref 135–145)
Total Bilirubin: 0.7 mg/dL (ref 0.3–1.2)
Total Protein: 5.6 g/dL — ABNORMAL LOW (ref 6.5–8.1)

## 2020-01-01 LAB — URINALYSIS, ROUTINE W REFLEX MICROSCOPIC
Bilirubin Urine: NEGATIVE
Glucose, UA: NEGATIVE mg/dL
Hgb urine dipstick: NEGATIVE
Ketones, ur: NEGATIVE mg/dL
Leukocytes,Ua: NEGATIVE
Nitrite: NEGATIVE
Protein, ur: 30 mg/dL — AB
Specific Gravity, Urine: 1.036 — ABNORMAL HIGH (ref 1.005–1.030)
pH: 6 (ref 5.0–8.0)

## 2020-01-01 LAB — TYPE AND SCREEN
ABO/RH(D): A POS
Antibody Screen: NEGATIVE

## 2020-01-01 LAB — CBC WITH DIFFERENTIAL/PLATELET
Abs Immature Granulocytes: 0.09 10*3/uL — ABNORMAL HIGH (ref 0.00–0.07)
Basophils Absolute: 0 10*3/uL (ref 0.0–0.1)
Basophils Relative: 0 %
Eosinophils Absolute: 0 10*3/uL (ref 0.0–0.5)
Eosinophils Relative: 0 %
HCT: 26.7 % — ABNORMAL LOW (ref 39.0–52.0)
Hemoglobin: 7.8 g/dL — ABNORMAL LOW (ref 13.0–17.0)
Immature Granulocytes: 1 %
Lymphocytes Relative: 8 %
Lymphs Abs: 0.6 10*3/uL — ABNORMAL LOW (ref 0.7–4.0)
MCH: 25.1 pg — ABNORMAL LOW (ref 26.0–34.0)
MCHC: 29.2 g/dL — ABNORMAL LOW (ref 30.0–36.0)
MCV: 85.9 fL (ref 80.0–100.0)
Monocytes Absolute: 0.9 10*3/uL (ref 0.1–1.0)
Monocytes Relative: 12 %
Neutro Abs: 6.1 10*3/uL (ref 1.7–7.7)
Neutrophils Relative %: 79 %
Platelets: 252 10*3/uL (ref 150–400)
RBC: 3.11 MIL/uL — ABNORMAL LOW (ref 4.22–5.81)
RDW: 17 % — ABNORMAL HIGH (ref 11.5–15.5)
WBC: 7.7 10*3/uL (ref 4.0–10.5)
nRBC: 0 % (ref 0.0–0.2)

## 2020-01-01 LAB — URIC ACID: Uric Acid, Serum: 2.3 mg/dL — ABNORMAL LOW (ref 3.7–8.6)

## 2020-01-01 LAB — ABO/RH: ABO/RH(D): A POS

## 2020-01-01 LAB — CBG MONITORING, ED: Glucose-Capillary: 148 mg/dL — ABNORMAL HIGH (ref 70–99)

## 2020-01-01 LAB — CBC
HCT: 24 % — ABNORMAL LOW (ref 39.0–52.0)
Hemoglobin: 7.2 g/dL — ABNORMAL LOW (ref 13.0–17.0)
MCH: 25.4 pg — ABNORMAL LOW (ref 26.0–34.0)
MCHC: 30 g/dL (ref 30.0–36.0)
MCV: 84.8 fL (ref 80.0–100.0)
Platelets: 196 10*3/uL (ref 150–400)
RBC: 2.83 MIL/uL — ABNORMAL LOW (ref 4.22–5.81)
RDW: 16.8 % — ABNORMAL HIGH (ref 11.5–15.5)
WBC: 5.1 10*3/uL (ref 4.0–10.5)
nRBC: 0 % (ref 0.0–0.2)

## 2020-01-01 LAB — SAVE SMEAR(SSMR), FOR PROVIDER SLIDE REVIEW

## 2020-01-01 LAB — PROTIME-INR
INR: 1.3 — ABNORMAL HIGH (ref 0.8–1.2)
Prothrombin Time: 15.3 seconds — ABNORMAL HIGH (ref 11.4–15.2)

## 2020-01-01 LAB — AMMONIA: Ammonia: 29 umol/L (ref 9–35)

## 2020-01-01 LAB — RESPIRATORY PANEL BY RT PCR (FLU A&B, COVID)
Influenza A by PCR: NEGATIVE
Influenza B by PCR: NEGATIVE
SARS Coronavirus 2 by RT PCR: NEGATIVE

## 2020-01-01 LAB — LACTATE DEHYDROGENASE: LDH: 282 U/L — ABNORMAL HIGH (ref 98–192)

## 2020-01-01 LAB — LACTIC ACID, PLASMA
Lactic Acid, Venous: 1.5 mmol/L (ref 0.5–1.9)
Lactic Acid, Venous: 1 mmol/L (ref 0.5–1.9)

## 2020-01-01 LAB — POC OCCULT BLOOD, ED: Fecal Occult Bld: NEGATIVE

## 2020-01-01 IMAGING — CT CT HEAD W/O CM
4 series · 15 of 47 positions shown, 17 images · non-contrast
Comparison: MRI [DATE], CT [DATE]

CLINICAL DATA: Mental status change

EXAM:
CT HEAD WITHOUT CONTRAST
TECHNIQUE: Contiguous axial images were obtained from the base of the skull
through the vertex without intravenous contrast.

[Series 3: head bone · axial · 0.44mm/px · z∈[-114,-100]mm · 2 of 75 slices shown]
[im 8/75  bone]
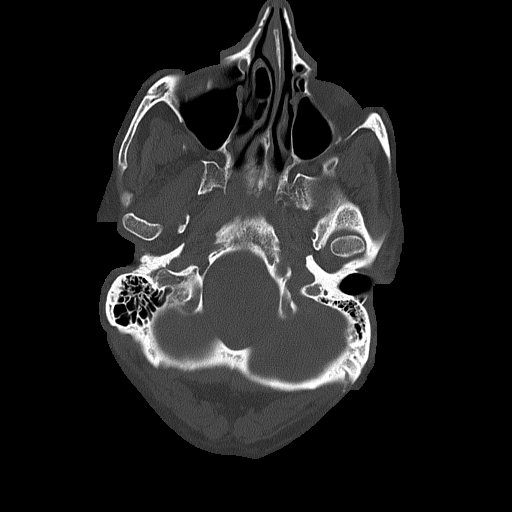
[im 15/75  bone]
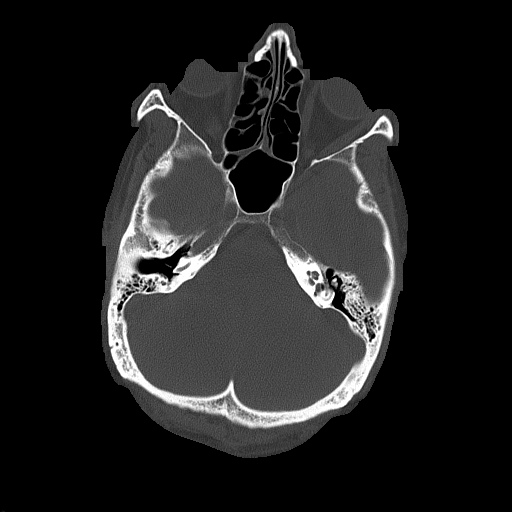

[Series 4: head without · axial · non-contrast · 0.44mm/px · z∈[-113,-3]mm · 7 of 30 slices shown, 9 images]
[im 4/30  brain]
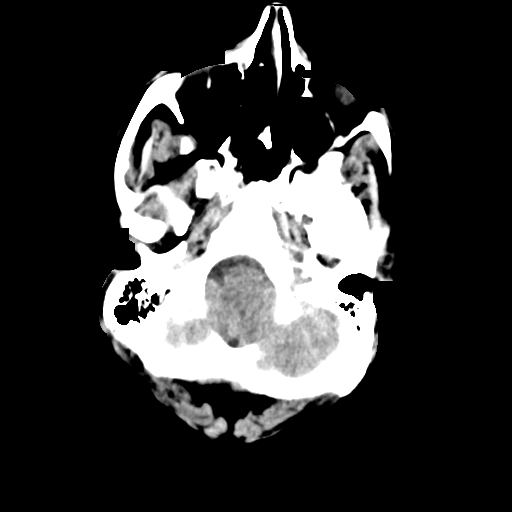
[im 4/30  bone]
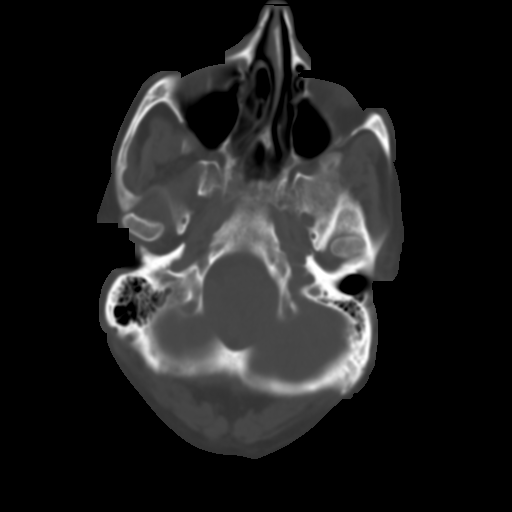
[im 8/30  brain]
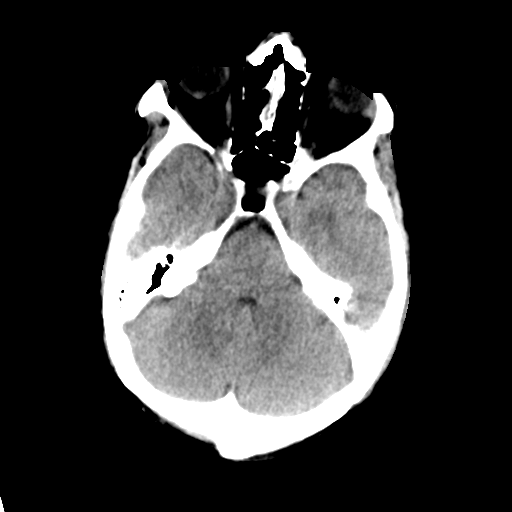
[im 11/30  brain]
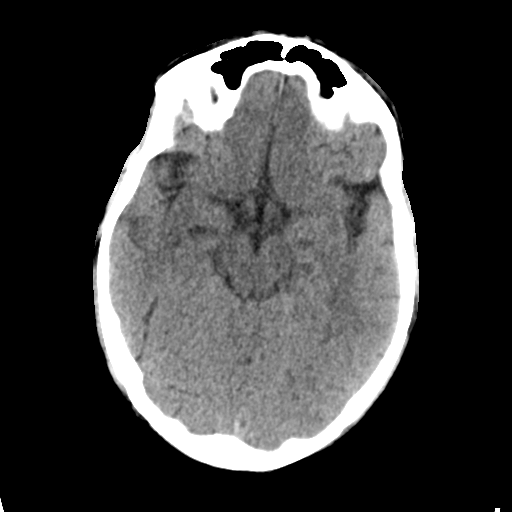
[im 15/30  brain]
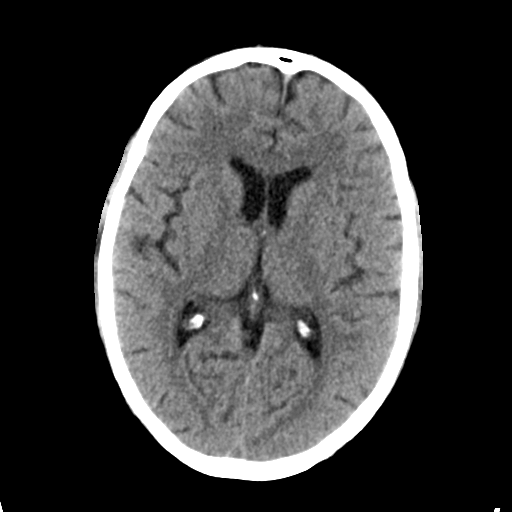
[im 19/30  brain]
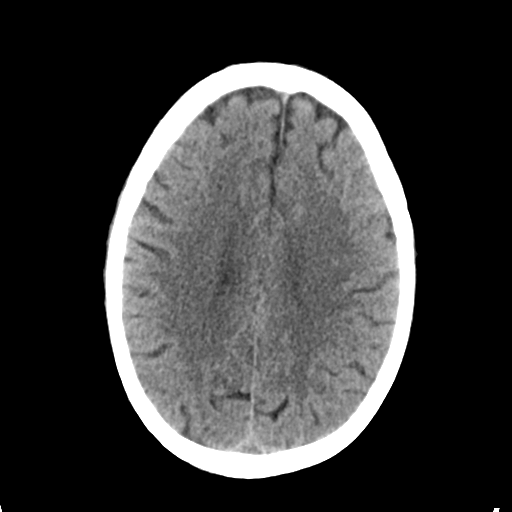
[im 19/30  bone]
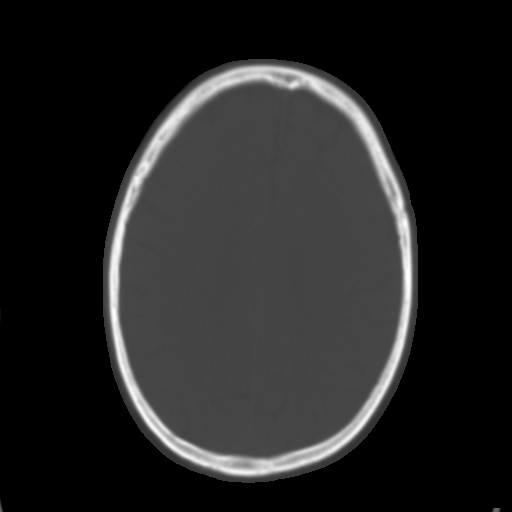
[im 22/30  brain]
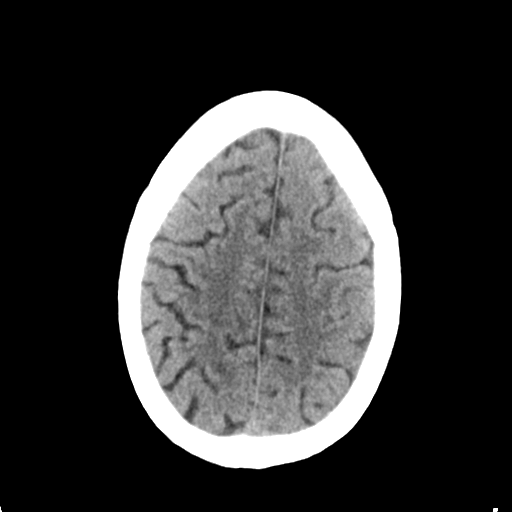
[im 26/30  brain]
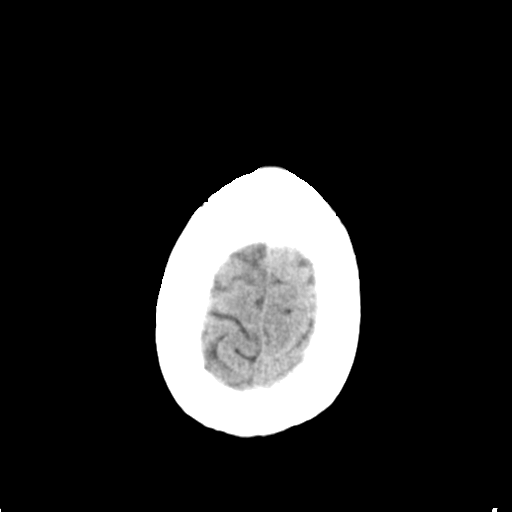

[Series 5: head without cor · coronal · non-contrast · 0.32mm/px · 3 of 70 slices shown]
[im 24/70  brain]
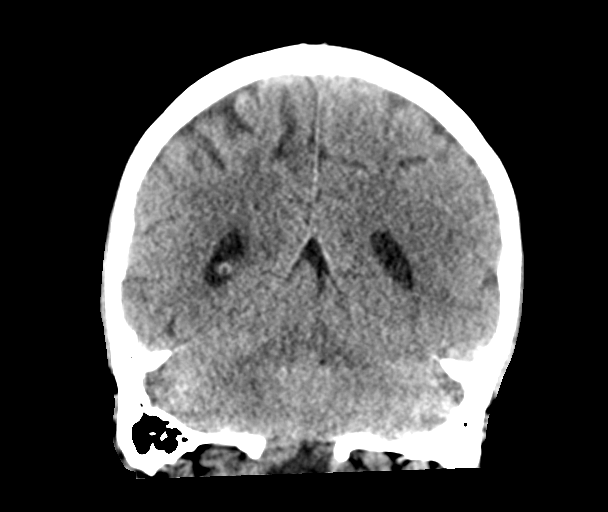
[im 31/70  brain]
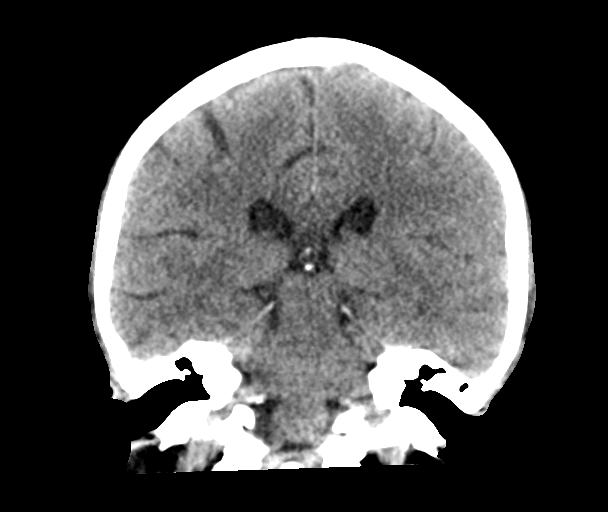
[im 39/70  brain]
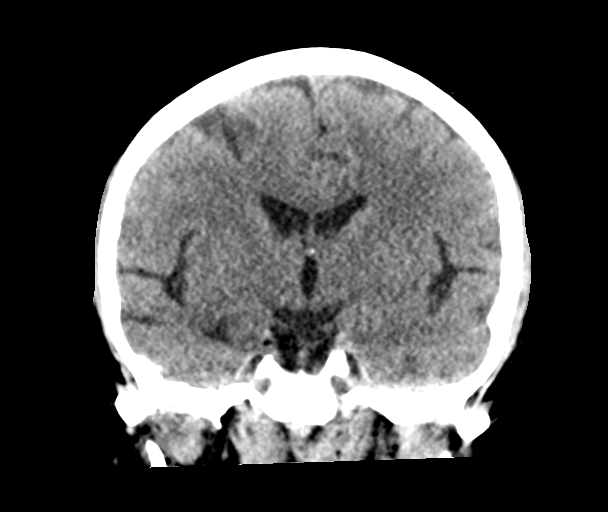

[Series 6: head without sag · sagittal · non-contrast · 0.32mm/px · 3 of 52 slices shown]
[im 18/52  brain]
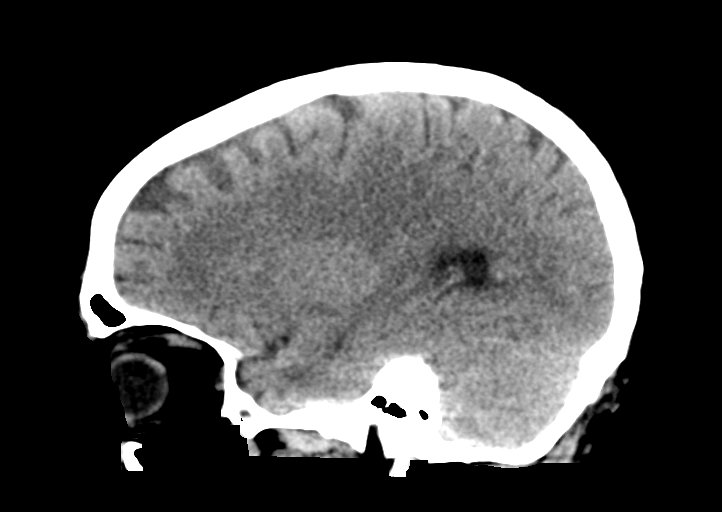
[im 26/52  brain]
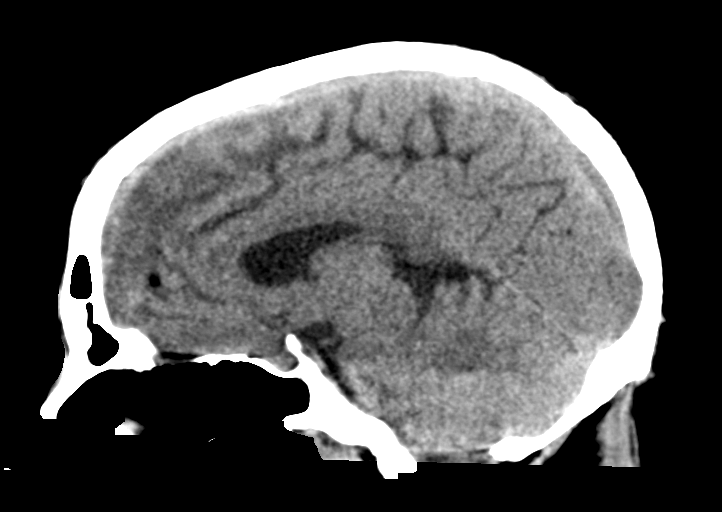
[im 35/52  brain]
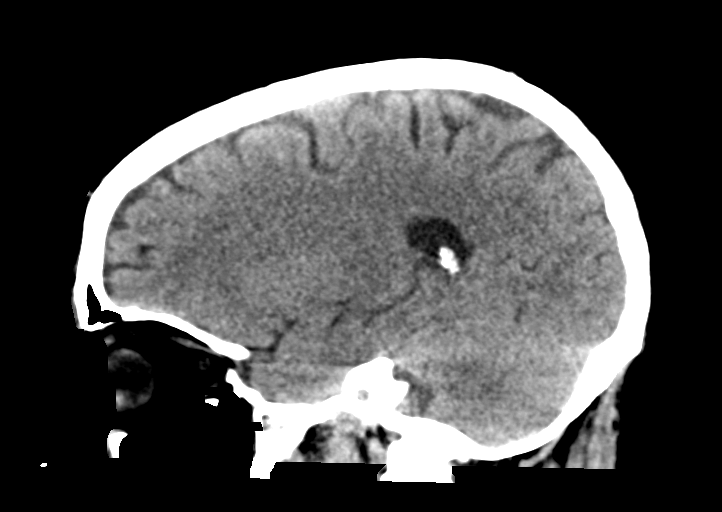

[15 of 47 positions shown; findings below may reference images not displayed]

FINDINGS: Brain: No evidence of acute infarction, hemorrhage, hydrocephalus,
extra-axial collection, visible mass lesion or mass effect. Basal
cisterns are patent. Borderline low positioning of the right
cerebellar tonsil projecting approximately 5 mm below the foramen
magnum but with otherwise morphologically normal appearance of the
posterior fossa.

Vascular: No hyperdense vessel or unexpected calcification.

Skull: No calvarial fracture or suspicious osseous lesion. No scalp
swelling or hematoma.

Sinuses/Orbits: Paranasal sinuses are clear. Left mastoid effusion.
Right mastoid air cells are predominantly clear. Partial
pneumatization of the petrous apices. Middle ear cavities are clear.
Included orbital structures are unremarkable.

Other: Leftward nasal septal deviation with a contacting left-sided
nasal septal spur and prominent right concha bullosa.
IMPRESSION: 1. No acute intracranial findings.
2. Low positioning of the right cerebellar tonsil projecting
approximately 5 mm below the foramen magnum compatible with
cerebellar tonsillar ectopia, nonspecific finding which can be
asymptomatic or on the spectrum of Chiari 1 malformation.
3. Left mastoid effusion.
4. Leftward nasal septal deviation with a contacting left-sided
nasal septal spur and prominent right concha bullosa.

## 2020-01-01 IMAGING — CT CT NECK W/ CM
3 of 12 series · 9 of 33 positions shown, 10 images · IV contrast (Omni 300)
Comparison: Soft tissue neck ultrasound [DATE]

CLINICAL DATA: Lymphadenopathy.  Abnormal ultrasound.

EXAM:
CT NECK WITH CONTRAST
TECHNIQUE: Multidetector CT imaging of the neck was performed using the
standard protocol following the bolus administration of intravenous
contrast.
CONTRAST:  75mL OMNIPAQUE IOHEXOL 300 MG/ML  SOLN

[Series 3: neck 2.0 st · axial · 0.57mm/px · z∈[-379,-107]mm · 3 of 137 slices shown, 4 images]
[im 1/137  soft-tissue]
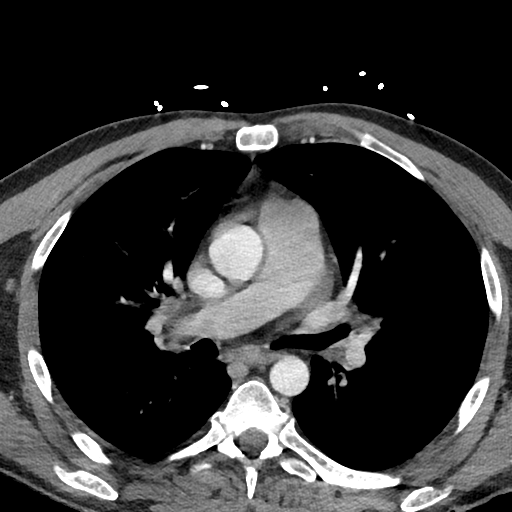
[im 1/137  bone]
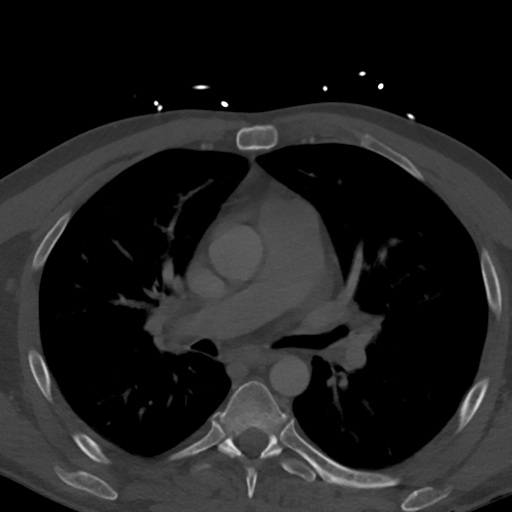
[im 69/137  bone]
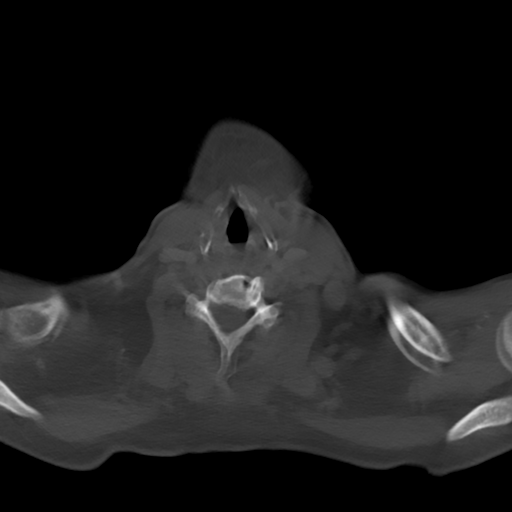
[im 137/137  bone]
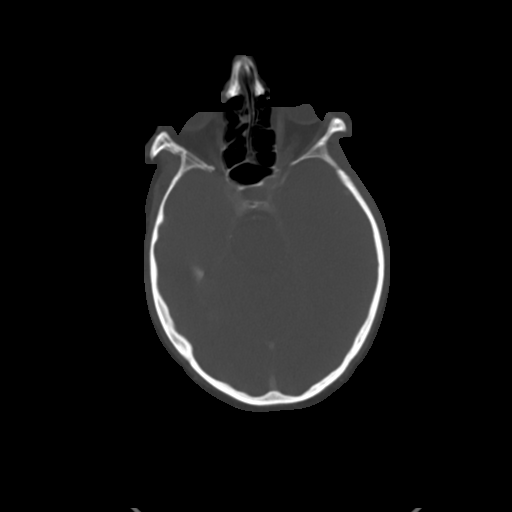

[Series 5: sagittal · sagittal · 0.57mm/px · 5 of 106 slices shown]
[im 18/106  bone]
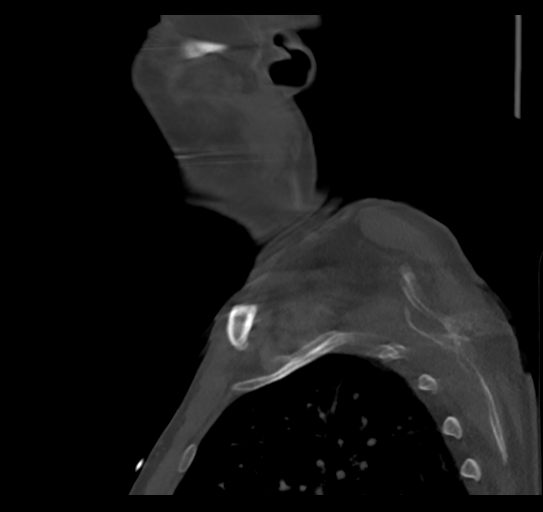
[im 36/106  bone]
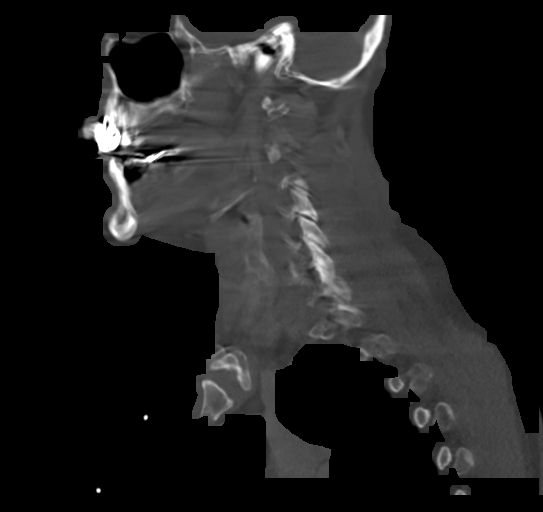
[im 53/106  bone]
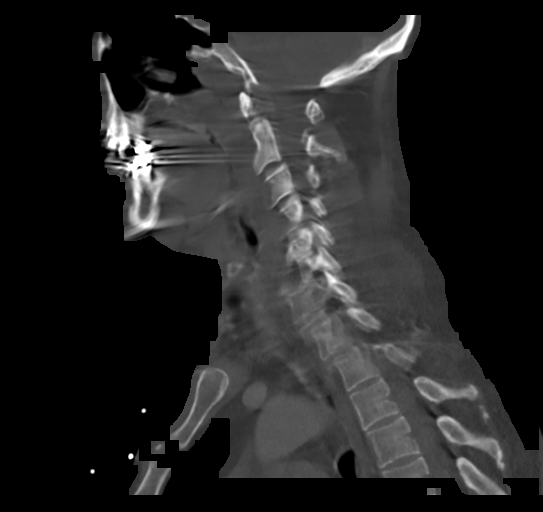
[im 71/106  bone]
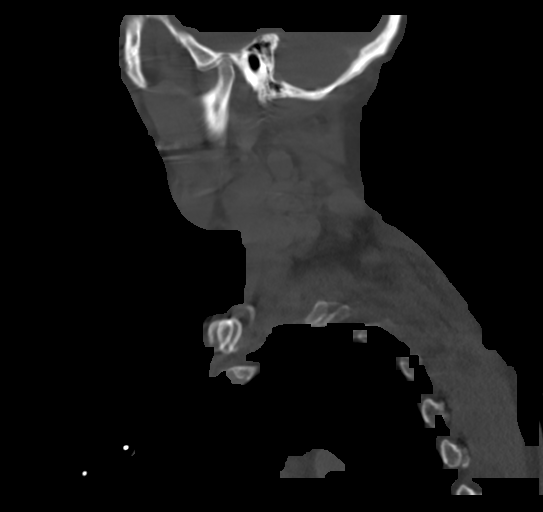
[im 88/106  bone]
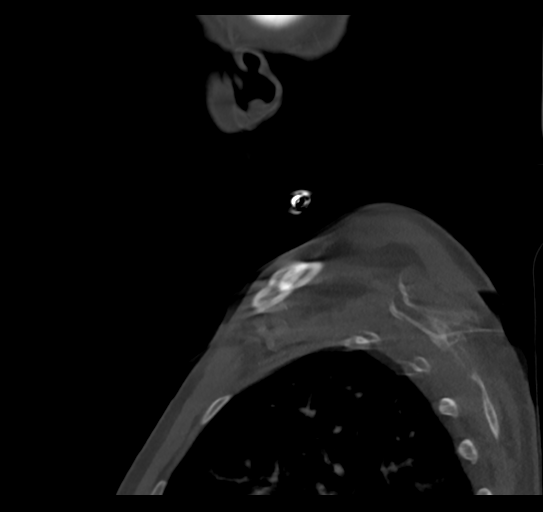

[Series 6: coronal · coronal · 0.58mm/px · 1 of 110 slices shown]
[im 55/110  bone]
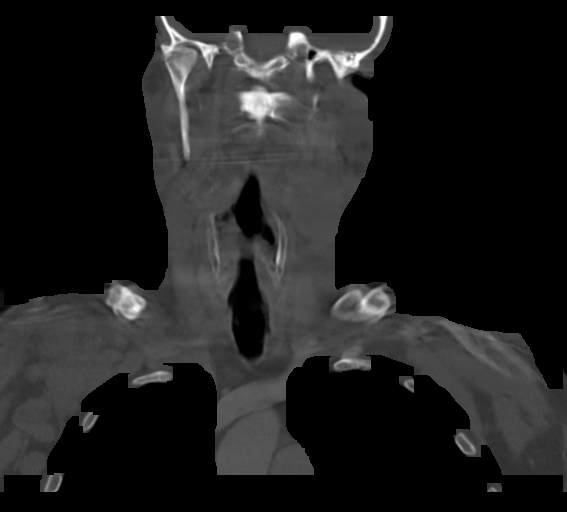

[9 of 33 positions shown; findings below may reference images not displayed]

FINDINGS: Pharynx and larynx: Study is mildly degraded by patient motion
throughout. No focal mucosal or submucosal lesions are present.
Nasopharynx is clear. Soft palate and tongue base are within normal
limits. Epiglottis is normal. Hypopharynx is within normal limits.
Vocal cord midline insert metric. The trachea is normal.

Salivary glands: The submandibular and parotid glands and ducts are
within normal limits.

Thyroid: Negative

Lymph nodes: Extensive left-sided adenopathy is present. Left
submandibular node or conglomeration of nodes measures 3.6 x 1.9 x
1.7 cm. Enlarged level 2 and level 3 lymph nodes are present the
largest left level 2 lymph node measures at least 2.5 x 1.8 cm. No
primary lesion is identified.

Vascular: No focal vascular lesions or significant atherosclerotic
changes present.

Limited intracranial: Within normal limits.

Visualized orbits: The globes and orbits are within normal limits.

Mastoids and visualized paranasal sinuses: The paranasal sinuses and
mastoid air cells are clear.

Skeleton: Degenerative changes are most evident C5-6 and C6-7.
Straightening of the normal cervical lordosis is present. No focal
lytic or blastic lesion is present.

Upper chest: The lung apices are clear. The thoracic inlet is within
normal limits.
IMPRESSION: 1. Extensive left-sided adenopathy without a primary lesion. This is
concerning for malignancy. This may represent metastatic disease of
unknown primary versus lymphoma or leukemia.
2. No primary lesion is identified.
3. Degenerative changes of the cervical spine are most evident C5-6
and C6-7.

## 2020-01-01 IMAGING — DX DG CHEST 1V PORT
1 series · 1 of 1 positions shown · non-contrast
Comparison: [DATE]

CLINICAL DATA: Cough

EXAM:
PORTABLE CHEST 1 VIEW

[chest ap]
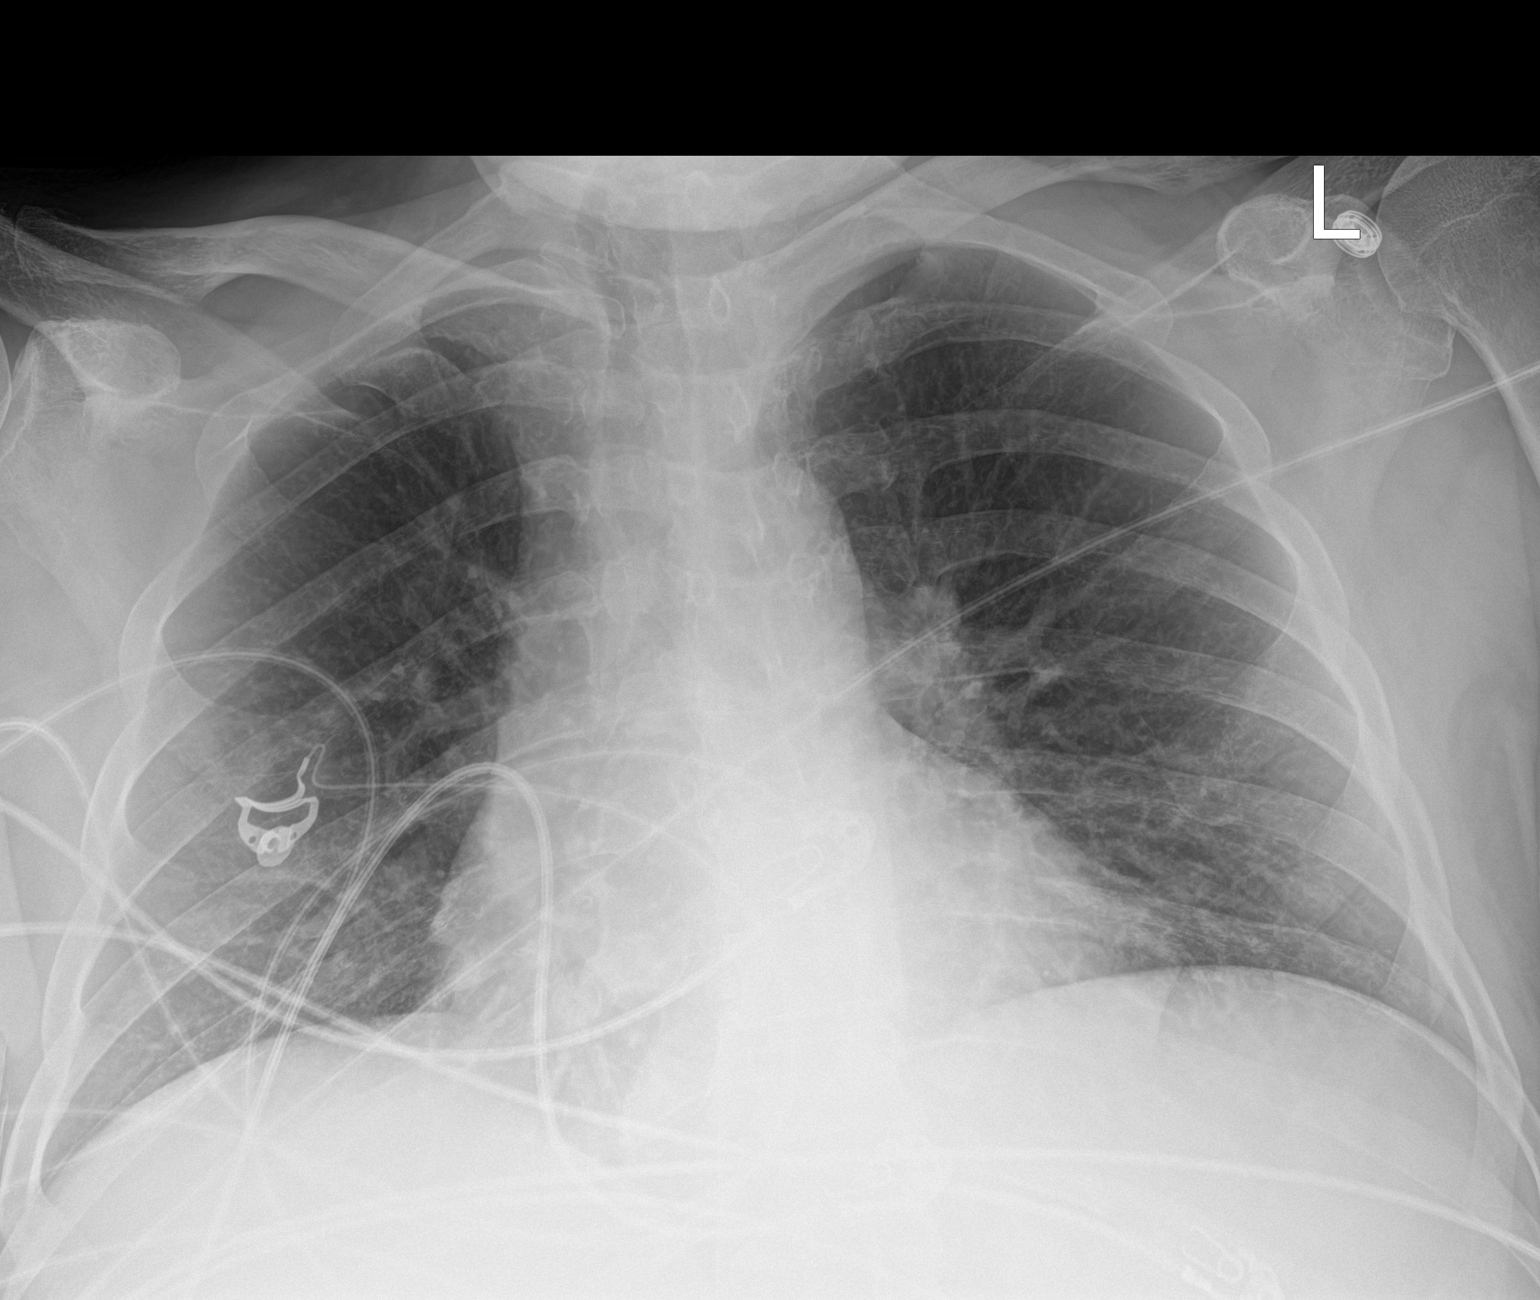

[1 of 1 positions shown; findings below may reference images not displayed]

FINDINGS: The cardiomediastinal silhouette is unchanged in contour.RIGHT
rounded retrocardiac opacity may reflect a hiatal hernia. No pleural
effusion. No pneumothorax. No acute pleuroparenchymal abnormality.
Visualized abdomen is unremarkable. Multilevel degenerative changes
of the thoracic spine.
IMPRESSION: No acute cardiopulmonary abnormality.

## 2020-01-01 IMAGING — CT CT CHEST-ABD-PELV W/ CM
2 of 5 series · 12 of 36 positions shown, 14 images · IV contrast (Omni 300)
Comparison: None.

CLINICAL DATA: Lymphadenopathy in the neck. Concern for metastatic
disease.

EXAM:
CT CHEST, ABDOMEN, AND PELVIS WITH CONTRAST
TECHNIQUE: Multidetector CT imaging of the chest, abdomen and pelvis was
performed following the standard protocol during bolus
administration of intravenous contrast.
CONTRAST:  100mL OMNIPAQUE IOHEXOL 300 MG/ML  SOLN

[Series 3: cap with 5mm st · axial · 0.89mm/px · z∈[+999,+1549]mm · 9 of 138 slices shown, 11 images]
[im 14/138  mediastinal]
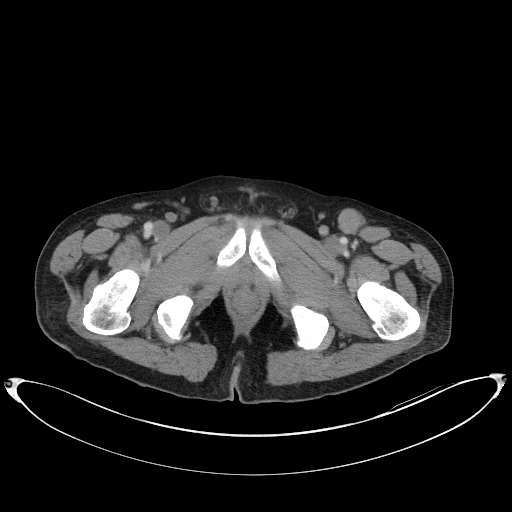
[im 14/138  bone]
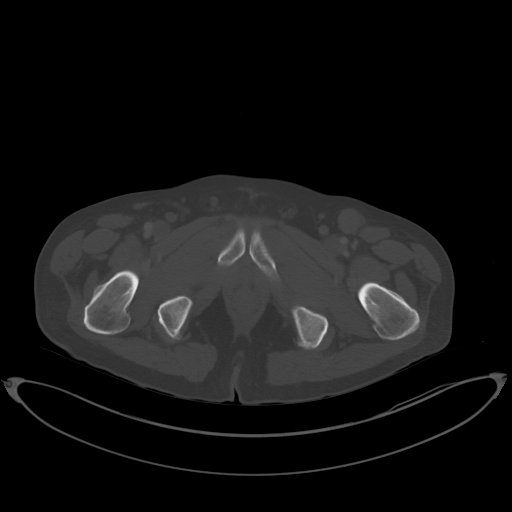
[im 28/138  mediastinal]
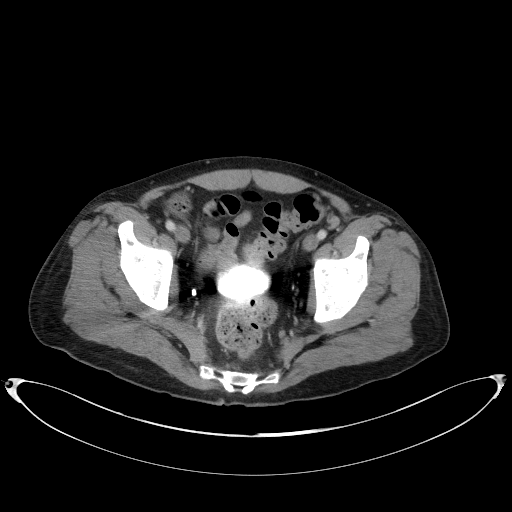
[im 42/138  mediastinal]
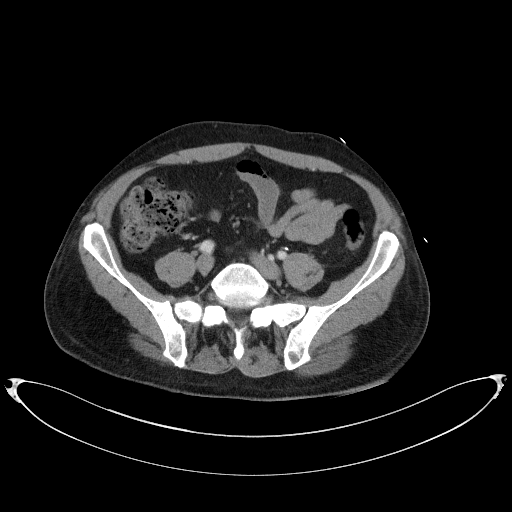
[im 55/138  mediastinal]
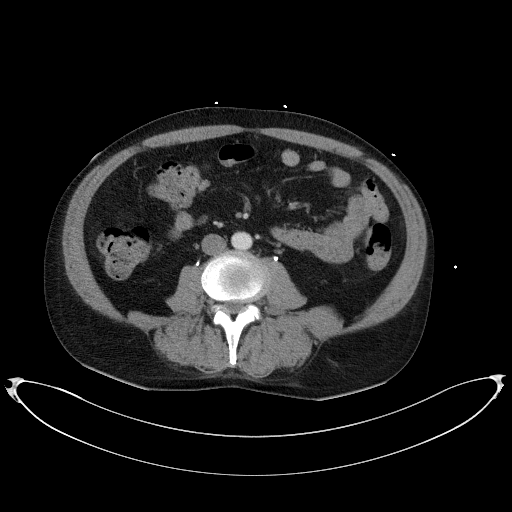
[im 69/138  mediastinal]
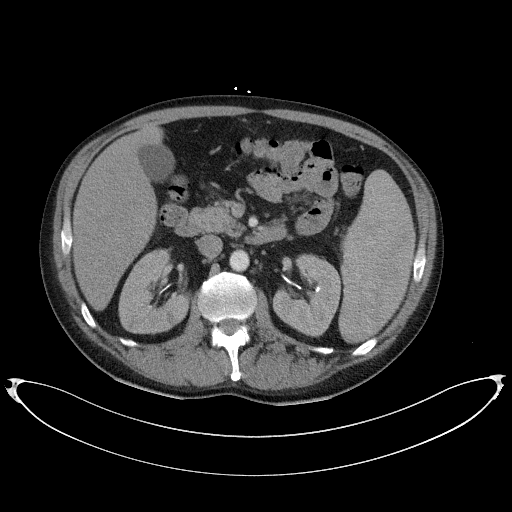
[im 83/138  mediastinal]
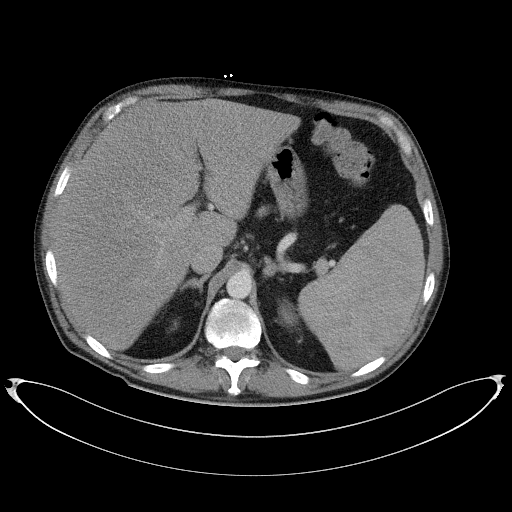
[im 96/138  mediastinal]
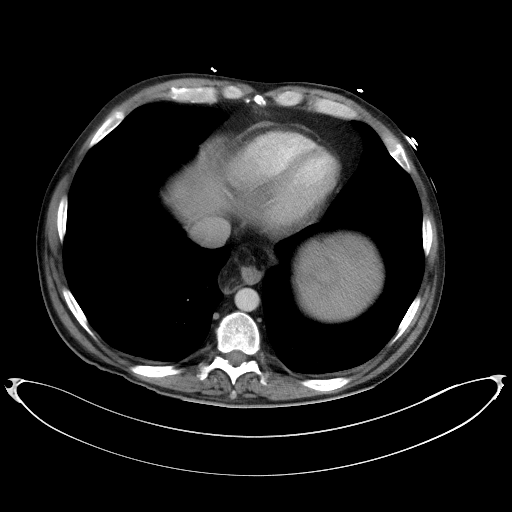
[im 110/138  mediastinal]
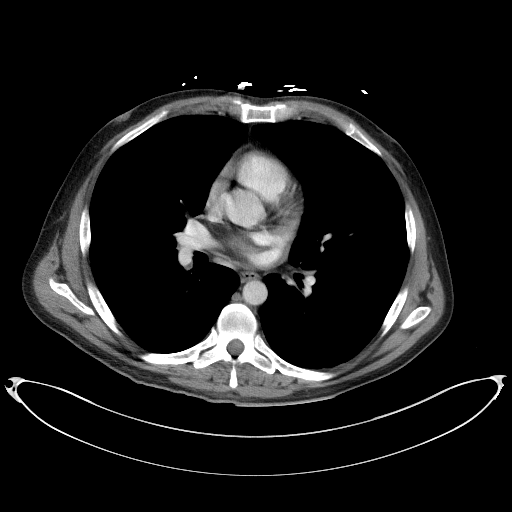
[im 124/138  mediastinal]
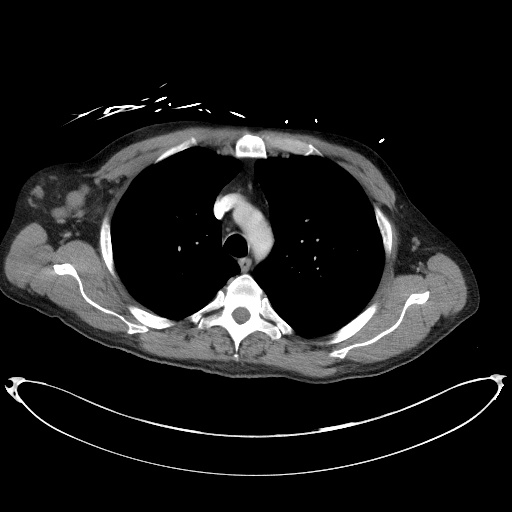
[im 124/138  bone]
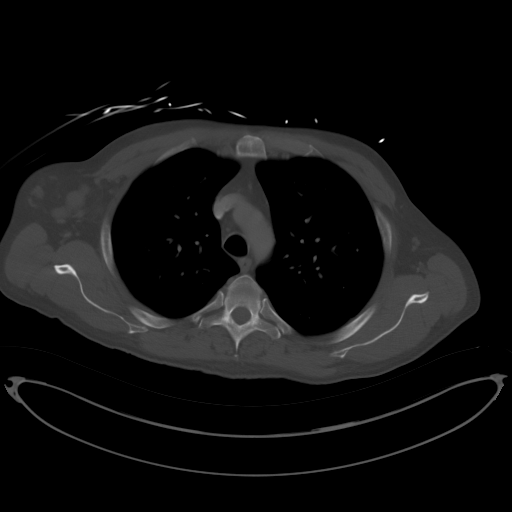

[Series 5: cap with 3mm st cor · coronal · 0.93mm/px · 3 of 165 slices shown]
[im 33/165  mediastinal]
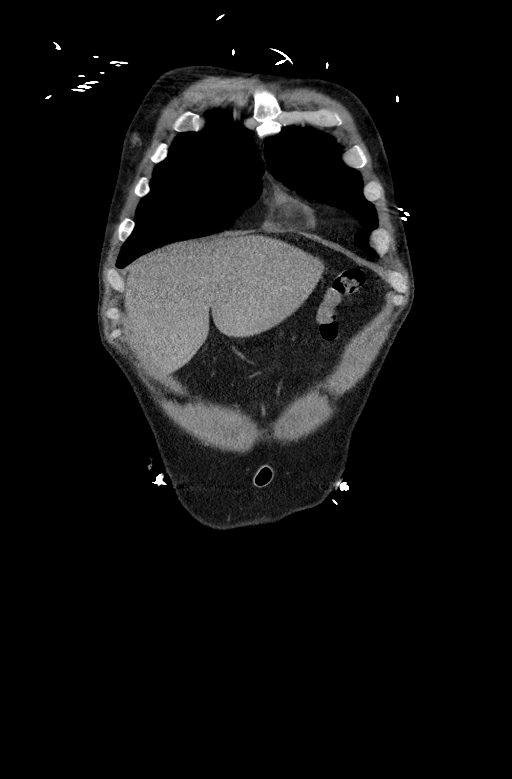
[im 66/165  mediastinal]
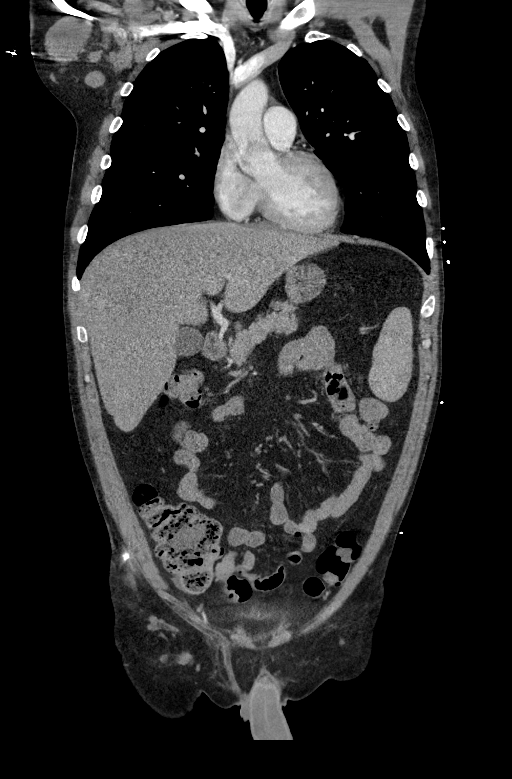
[im 99/165  mediastinal]
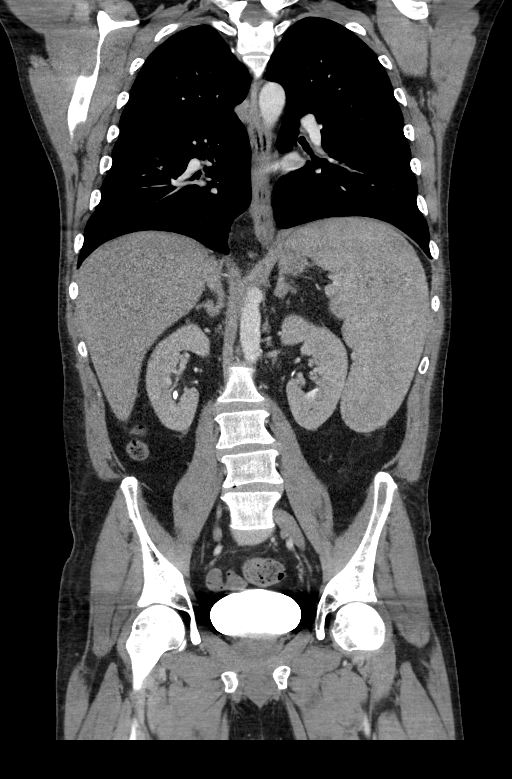

[12 of 36 positions shown; findings below may reference images not displayed]

FINDINGS: CT CHEST FINDINGS

Cardiovascular: The heart size is unremarkable. There is a trace
pericardial effusion. Minimal atherosclerotic changes are noted of
the thoracic aorta. There is no dissection. There is no large
centrally located pulmonary embolism. Detection of smaller pulmonary
emboli is limited by contrast bolus timing and technique.

Mediastinum/Nodes:

-- No mediastinal lymphadenopathy.

--there are few mildly prominent hilar lymph nodes. For example on
the left there is a 9 mm hilar lymph node (axial series 3, image
27).

--there is no significant axillary adenopathy on the left. Multiple
pathologically enlarged axillary lymph nodes are noted on the right
measuring up to approximately 2.7 x 5.6 cm.

-- No supraclavicular lymphadenopathy.

-- Normal thyroid gland where visualized.

-  Unremarkable esophagus.

Lungs/Pleura: There is a 3 mm pulmonary nodule in the right upper
lobe (axial series 4, image 22). There is an additional 3 mm
pulmonary nodule in the right upper lobe (axial series 4, image 35).
There is a ground-glass airspace opacity in the right upper lobe
measuring approximately 2.2 cm (axial series 4, image 49). There is
a 5-6 mm pulmonary nodule in the right upper lobe (axial series 4,
image 70). There is a 6 mm pulmonary nodule in the right middle lobe
(axial series 4, image 104). There is atelectasis in the right lower
lobe.

Musculoskeletal: No chest wall abnormality. No bony spinal canal
stenosis. Mild bilateral gynecomastia is noted.

CT ABDOMEN PELVIS FINDINGS

Hepatobiliary: There is hepatic steatosis. The liver is enlarged
measuring approximately 19 cm craniocaudad. There is no discrete
hepatic mass. Normal gallbladder.There is no biliary ductal
dilation.

Pancreas: Normal contours without ductal dilatation. No
peripancreatic fluid collection.

Spleen: The spleen is significantly enlarged measuring approximately
19.4 cm craniocaudad.

Adrenals/Urinary Tract:

--Adrenal glands: Unremarkable.

--Right kidney/ureter: There is a low-attenuation 2.1 cm structure
in the right kidney measuring approximately 21 Hounsfield units.
There is no hydronephrosis.

--Left kidney/ureter: No hydronephrosis or radiopaque kidney stones.

--Urinary bladder: Unremarkable.

Stomach/Bowel:

--Stomach/Duodenum: No hiatal hernia or other gastric abnormality.
Normal duodenal course and caliber.

--Small bowel: Unremarkable.

--Colon: There are few scattered colonic diverticula without CT
evidence for diverticulitis.

--Appendix: Normal.

Vascular/Lymphatic: Atherosclerotic calcification is present within
the non-aneurysmal abdominal aorta, without hemodynamically
significant stenosis.

--No retroperitoneal lymphadenopathy.

--No mesenteric lymphadenopathy.

--there is a pathologically enlarged left inguinal lymph node
measuring approximately 2.5 cm (axial series 3, image 124).

Reproductive: The prostate gland is enlarged.

Other: No ascites or free air. The abdominal wall is normal.

Musculoskeletal. No acute displaced fractures.
IMPRESSION: 1. Pathologically enlarged lymph nodes in the right axilla and left
groin in addition to hepatosplenomegaly is concerning for a
lymphoproliferative disorder such as lymphoma. The lymph nodes in
the right axilla and left groin are readily amenable to percutaneous
biopsy.
2. Hepatic steatosis.
3. Indeterminate 2.1 cm nodule in the right kidney. This is favored
to represent a proteinaceous or hemorrhagic cyst. Follow-up with a
nonemergent renal ultrasound as an outpatient is recommended for
confirmation.
4. Multiple indeterminate pulmonary nodules are noted measuring up
to approximately 6 mm. Follow-up non-contrast CT recommended at 3-6
months to confirm persistence. If unchanged, and solid component
remains <6 mm, annual CT is recommended until 5 years of stability
has been established. If persistent these nodules should be
considered highly suspicious if the solid component of the nodule is
6 mm or greater in size and enlarging. This recommendation follows
the consensus statement: Guidelines for Management of Incidental
Pulmonary Nodules Detected on CT Images: From the [HOSPITAL]
5. There is a subtle 2.2 cm ground-glass opacity in the right upper
lobe as detailed above. This is favored to represent an infectious
or inflammatory process. Attention on follow-up CT is recommended.

Aortic Atherosclerosis ([25]-[25]).

## 2020-01-01 MED ORDER — ALBUTEROL SULFATE (2.5 MG/3ML) 0.083% IN NEBU: 3.0000 mL | INHALATION_SOLUTION | Freq: Four times a day (QID) | RESPIRATORY_TRACT | Status: DC | PRN

## 2020-01-01 MED ORDER — SODIUM CHLORIDE 0.9 % IV SOLN: 250.0000 mL | INTRAVENOUS | Status: DC | PRN

## 2020-01-01 MED ORDER — SODIUM CHLORIDE 0.9 % IV BOLUS
1000.0000 mL | Freq: Once | INTRAVENOUS | Status: AC
  Administered 2020-01-01: 1000 mL via INTRAVENOUS

## 2020-01-01 MED ORDER — ACETAMINOPHEN 650 MG RE SUPP
650.0000 mg | Freq: Four times a day (QID) | RECTAL | Status: DC | PRN
  Administered 2020-01-06: 650 mg via RECTAL
  Filled 2020-01-01: qty 1

## 2020-01-01 MED ORDER — ADULT MULTIVITAMIN W/MINERALS CH
1.0000 | ORAL_TABLET | Freq: Every day | ORAL | Status: DC
  Administered 2020-01-02 – 2020-01-03 (×2): 1 via ORAL
  Filled 2020-01-01 (×4): qty 1

## 2020-01-01 MED ORDER — SODIUM CHLORIDE 0.9% FLUSH: 3.0000 mL | INTRAVENOUS | Status: DC | PRN

## 2020-01-01 MED ORDER — PANTOPRAZOLE SODIUM 40 MG PO TBEC
40.0000 mg | DELAYED_RELEASE_TABLET | Freq: Every day | ORAL | Status: DC
  Administered 2020-01-02 – 2020-01-03 (×2): 40 mg via ORAL
  Filled 2020-01-01 (×4): qty 1

## 2020-01-01 MED ORDER — ACETAMINOPHEN 325 MG PO TABS: 650.0000 mg | ORAL_TABLET | Freq: Four times a day (QID) | ORAL | Status: DC | PRN

## 2020-01-01 MED ORDER — SODIUM CHLORIDE 0.9% FLUSH
3.0000 mL | Freq: Two times a day (BID) | INTRAVENOUS | Status: DC
  Administered 2020-01-02 – 2020-01-05 (×5): 3 mL via INTRAVENOUS

## 2020-01-01 MED ORDER — IOHEXOL 300 MG/ML  SOLN
75.0000 mL | Freq: Once | INTRAMUSCULAR | Status: AC | PRN
  Administered 2020-01-01: 75 mL via INTRAVENOUS

## 2020-01-01 MED ORDER — ENOXAPARIN SODIUM 40 MG/0.4ML ~~LOC~~ SOLN
40.0000 mg | SUBCUTANEOUS | Status: DC
  Administered 2020-01-01 – 2020-01-02 (×2): 40 mg via SUBCUTANEOUS
  Filled 2020-01-01 (×2): qty 0.4

## 2020-01-01 MED ORDER — IBUPROFEN 400 MG PO TABS
600.0000 mg | ORAL_TABLET | Freq: Once | ORAL | Status: AC
  Administered 2020-01-01: 600 mg via ORAL
  Filled 2020-01-01: qty 1

## 2020-01-01 MED ORDER — LACTATED RINGERS IV SOLN: INTRAVENOUS | Status: AC

## 2020-01-01 MED ORDER — MULTIVITAMINS PO CAPS: 1.0000 | ORAL_CAPSULE | Freq: Every day | ORAL | Status: DC

## 2020-01-01 MED ORDER — IOHEXOL 300 MG/ML  SOLN
100.0000 mL | Freq: Once | INTRAMUSCULAR | Status: AC | PRN
  Administered 2020-01-01: 100 mL via INTRAVENOUS

## 2020-01-01 NOTE — ED Triage Notes (Signed)
Pt was brought in due to having altered mental status while being a passenger in the car. While in the care the pt was having decreased LOC. GCS 15. Increased RR. Pressures were 96/60 when EMS arrived. Received 324ml off NS. Pressure upon arrival to hospital was 100/64. Hx: of increased liver enzymes.

## 2020-01-01 NOTE — H&P (Signed)
History and Physical   Draden Cottingham BDZ:[id number] DOB: 1961-02-28 DOA: 01/01/2020  PCP: Aletha Halim., PA-C   Patient coming from: Home  Chief Complaint: Altered mental status  HPI: Brian Ochoa is a 59 y.o. male with medical history significant of TIA, and recent admission for altered mental status from 9/22-9/29 who presents again with altered mental status.  During previous hospital physician he presented confused and febrile he was initially admitted for sepsis but no source was noted and work-up was negative except for positive hepatitis C and elevated liver enzymes.  GI was consulted during that admission and his liver elevations was thought to be due to DILI secondary to doxycycline.  He also had a negative MRI at that time.  He was discharged with follow-up with ID and GI.  When speaking with his wife in the room and his son by phone, they report that he never got back to baseline after his previous admission. And that he has had increasing fatigue, weight loss, decreased appetite for about the last 2 months or so. Since discharge, he began to gradually worsen in terms of his cognition. His wife would frequently need to remind him to do things and he had a fall earlier in the week when trying to take out the trash on the wrong day.  Today, he began to have altered mental status while at home and his wife was going to try to drive him to the hospital, but he became worse and so she called EMS to take him the rest of the way.  On arrival his blood pressure was 96/60 and he was given a 300 mL bolus. He also is noted to have ongoing lymphadenopathy in his neck.  A ultrasound was being done by infectious disease which was inconclusive and a CT was done in the ED today for further evaluation. Cognition improved in the ED is now alert and oriented to person place year and month. As above he reports weight loss, fatigue, decreased appetite, fevers. He denies shortness of breath, chest pain,  abdominal pain.  ED Course: Labs notable for hemoglobin drop to 7.8 from 9.7 on previous.  FOBT negative.  His CMP showed stable LFT elevations with AST of 130, ALT of 209, ALP of 289; T bili within normal limits; low protein and albumin.  PT and INR elevated at 15 and 1.3 respectively.  Lactic acid within normal limits, UA showing only 30 protein, chest x-ray unremarkable.  CT head performed and remarkable for low positioning of right cerebellar tonsil (nonspecific finding can be asymptomatic or on spectrum of Chiari I).  CT soft tissue neck showed extensive adenopathy without a primary lesion concerning for malignancy (metastatic versus lymphoma/leukemia.  Patient received additional 1 L bolus in ED. hospitalist service to admit for further work-up and care.  Review of Systems: As per HPI otherwise all other systems reviewed and are negative.  Past Medical History:  Diagnosis Date  . TIA (transient ischemic attack)     No past surgical history on file.  Social History  reports that he has been smoking cigars. His smokeless tobacco use includes chew. He reports previous alcohol use. He reports that he does not use drugs.  No Known Allergies  No family history on file.  Prior to Admission medications   Medication Sig Start Date End Date Taking? Authorizing Provider  acetaminophen (TYLENOL) 325 MG tablet Take 650 mg by mouth daily.   Yes [provider]  albuterol (VENTOLIN HFA) 108 (90 Base)  MCG/ACT inhaler Inhale 2 puffs into the lungs every 6 (six) hours as needed for wheezing or shortness of breath.   Yes [provider]  aspirin 325 MG tablet Take 325 mg by mouth daily.   Yes [provider]  benzonatate (TESSALON) 100 MG capsule Take 100-200 mg by mouth 3 (three) times daily as needed for cough.   Yes [provider]  Multiple Vitamin (MULTIVITAMIN) capsule Take 1 capsule by mouth daily.   Yes [provider]  pantoprazole (PROTONIX) 40 MG  tablet Take 1 tablet (40 mg total) by mouth daily before breakfast. 12/22/19  Yes Damita Lack, MD   Physical Exam: Vitals:   01/01/20 1730 01/01/20 1800 01/01/20 1815 01/01/20 1830  BP:  109/61 102/86 96/63  Pulse:  (!) 103 (!) 101 98  Resp: (!) 22  (!) 31 (!) 29  Temp:      TempSrc:      SpO2:  95% 96% 95%  Weight:      Height:       Physical Exam Constitutional:      General: He is not in acute distress.    Appearance: Normal appearance.  HENT:     Head: Normocephalic and atraumatic.     Mouth/Throat:     Mouth: Mucous membranes are moist.     Pharynx: Oropharynx is clear.  Eyes:     Extraocular Movements: Extraocular movements intact.     Pupils: Pupils are equal, round, and reactive to light.  Cardiovascular:     Rate and Rhythm: Normal rate and regular rhythm.     Pulses: Normal pulses.     Heart sounds: Normal heart sounds.  Pulmonary:     Effort: Pulmonary effort is normal. No respiratory distress.     Breath sounds: Normal breath sounds.  Abdominal:     General: Bowel sounds are normal. There is no distension.     Palpations: Abdomen is soft.     Tenderness: There is no abdominal tenderness.  Musculoskeletal:        General: No swelling or deformity.  Lymphadenopathy:     Cervical: Cervical adenopathy present.  Skin:    General: Skin is warm and dry.  Neurological:     Comments: Alert and oriented to person, place, year, month Not oriented to date or day    Labs on Admission: I have personally reviewed following labs and imaging studies  CBC: Recent Labs  Lab 12/30/19 1615 01/01/20 1538  WBC 10.0 7.7  NEUTROABS 8,010* 6.1  HGB 9.7* 7.8*  HCT 29.3* 26.7*  MCV 77.3* 85.9  PLT 329 509    Basic Metabolic Panel: Recent Labs  Lab 12/30/19 1615 01/01/20 1538  NA 131* 134*  K 4.0 3.7  CL 93* 99  CO2 26 25  GLUCOSE 127* 138*  BUN 13 14  CREATININE 0.76 0.88  CALCIUM 8.4* 7.9*    GFR: Estimated Creatinine Clearance: 98.6 mL/min (by  C-G formula based on SCr of 0.88 mg/dL).  Liver Function Tests: Recent Labs  Lab 12/30/19 1615 01/01/20 1538  AST 132* 134*  ALT 281* 209*  ALKPHOS  --  289*  BILITOT 1.3* 0.7  PROT 5.9* 5.6*  ALBUMIN  --  1.7*    Urine analysis:    Component Value Date/Time   COLORURINE AMBER (A) 01/01/2020 1743   APPEARANCEUR CLEAR 01/01/2020 1743   LABSPEC 1.036 (H) 01/01/2020 1743   PHURINE 6.0 01/01/2020 1743   GLUCOSEU NEGATIVE 01/01/2020 1743   HGBUR NEGATIVE  01/01/2020 [place] 01/01/2020 [place] 01/01/2020 1743   PROTEINUR 30 (A) 01/01/2020 1743   NITRITE NEGATIVE 01/01/2020 1743   LEUKOCYTESUR NEGATIVE 01/01/2020 1743    Radiological Exams on Admission: CT Head Wo Contrast  Result Date: 01/01/2020 CLINICAL DATA:  Mental status change EXAM: CT HEAD WITHOUT CONTRAST TECHNIQUE: Contiguous axial images were obtained from the base of the skull through the vertex without intravenous contrast. COMPARISON:  MRI 12/16/2019, CT 12/15/2019 FINDINGS: Brain: No evidence of acute infarction, hemorrhage, hydrocephalus, extra-axial collection, visible mass lesion or mass effect. Basal cisterns are patent. Borderline low positioning of the right cerebellar tonsil projecting approximately 5 mm below the foramen magnum but with otherwise morphologically normal appearance of the posterior fossa. Vascular: No hyperdense vessel or unexpected calcification. Skull: No calvarial fracture or suspicious osseous lesion. No scalp swelling or hematoma. Sinuses/Orbits: Paranasal sinuses are clear. Left mastoid effusion. Right mastoid air cells are predominantly clear. Partial pneumatization of the petrous apices. Middle ear cavities are clear. Included orbital structures are unremarkable. Other: Leftward nasal septal deviation with a contacting left-sided nasal septal spur and prominent right concha bullosa. IMPRESSION: 1. No acute intracranial findings. 2. Low positioning of the  right cerebellar tonsil projecting approximately 5 mm below the foramen magnum compatible with cerebellar tonsillar ectopia, nonspecific finding which can be asymptomatic or on the spectrum of Chiari 1 malformation. 3. Left mastoid effusion. 4. Leftward nasal septal deviation with a contacting left-sided nasal septal spur and prominent right concha bullosa. Electronically Signed   By: Lovena Le M.D.   On: 01/01/2020 17:34   CT Soft Tissue Neck W Contrast  Result Date: 01/01/2020 CLINICAL DATA:  Lymphadenopathy.  Abnormal ultrasound. EXAM: CT NECK WITH CONTRAST TECHNIQUE: Multidetector CT imaging of the neck was performed using the standard protocol following the bolus administration of intravenous contrast. CONTRAST:  36mL OMNIPAQUE IOHEXOL 300 MG/ML  SOLN COMPARISON:  Soft tissue neck ultrasound 12/31/2019 FINDINGS: Pharynx and larynx: Study is mildly degraded by patient motion throughout. No focal mucosal or submucosal lesions are present. Nasopharynx is clear. Soft palate and tongue base are within normal limits. Epiglottis is normal. Hypopharynx is within normal limits. Vocal cord midline insert metric. The trachea is normal. Salivary glands: The submandibular and parotid glands and ducts are within normal limits. Thyroid: Negative Lymph nodes: Extensive left-sided adenopathy is present. Left submandibular node or conglomeration of nodes measures 3.6 x 1.9 x 1.7 cm. Enlarged level 2 and level 3 lymph nodes are present the largest left level 2 lymph node measures at least 2.5 x 1.8 cm. No primary lesion is identified. Vascular: No focal vascular lesions or significant atherosclerotic changes present. Limited intracranial: Within normal limits. Visualized orbits: The globes and orbits are within normal limits. Mastoids and visualized paranasal sinuses: The paranasal sinuses and mastoid air cells are clear. Skeleton: Degenerative changes are most evident C5-6 and C6-7. Straightening of the normal cervical  lordosis is present. No focal lytic or blastic lesion is present. Upper chest: The lung apices are clear. The thoracic inlet is within normal limits. IMPRESSION: 1. Extensive left-sided adenopathy without a primary lesion. This is concerning for malignancy. This may represent metastatic disease of unknown primary versus lymphoma or leukemia. 2. No primary lesion is identified. 3. Degenerative changes of the cervical spine are most evident C5-6 and C6-7. Electronically Signed   By: San Morelle M.D.   On: 01/01/2020 17:42   US SOFT TISSUE HEAD & NECK (NON-THYROID)  Result Date: 12/31/2019  CLINICAL DATA:  Left-sided cervical lymphadenopathy. Weakness and weight loss. EXAM: ULTRASOUND OF HEAD/NECK SOFT TISSUES TECHNIQUE: Ultrasound examination of the head and neck soft tissues was performed in the area of clinical concern. COMPARISON:  None. FINDINGS: Patient's palpable area of concern within the left neck correlates with several clustered potentially pathologically enlarged cervical lymph nodes (representative image 28). Dominant left-sided cervical lymph node measures 1.4 cm in greatest short axis diameter with obliteration of the fatty hilum (image 30). IMPRESSION: Patient's palpable area of concern within the left neck correlates with left cervical lymphadenopathy, nonspecific and while potentially infectious or inflammatory in etiology, malignancy is not excluded on the basis of this examination. Clinical correlation is advised. Further evaluation with contrast-enhanced neck CT could be performed as indicated. These results will be called to the ordering clinician or representative by the Radiologist Assistant, and communication documented in the PACS or [employer]. Electronically Signed   By: Sandi Mariscal M.D.   On: 12/31/2019 15:11   DG Chest Portable 1 View  Result Date: 01/01/2020 CLINICAL DATA:  Cough EXAM: PORTABLE CHEST 1 VIEW COMPARISON:  December 15, 2019 FINDINGS: The  cardiomediastinal silhouette is unchanged in contour.RIGHT rounded retrocardiac opacity may reflect a hiatal hernia. No pleural effusion. No pneumothorax. No acute pleuroparenchymal abnormality. Visualized abdomen is unremarkable. Multilevel degenerative changes of the thoracic spine. IMPRESSION: No acute cardiopulmonary abnormality. Electronically Signed   By: Valentino Saxon MD   On: 01/01/2020 16:32    EKG: Independently reviewed, sinus tachycardia  Assessment/Plan Principal Problem:   Acute encephalopathy Active Problems:   Hepatitis C antibody positive in blood   Lymphadenopathy of head and neck region  Acute encephalopathy: Similar presentation in September. Negative work-up for sepsis at that time. When speaking with family, they state that his mentation never fully returned to prior baseline and gradually worsened at home since discharge. It was significantly worse today, but has improved after receiving IV fluids in the ED. Now mentating similar to how he was after discharge, but once again not at prior baseline. - Do not suspect sepsis no leukocytosis and negative recent work-up - May be related to lymphadenopathy concerning for malignancy as below - CT head showed low positioning of the right cerebellar tonsil, described as nonspecific finding which could be asymptomatic versus on the Chiari I spectrum - MRI done on 12/16/2019, during recent admission, was motion degraded and terminated prematurely, but no evidence of intracranial mass identified on images that were obtained - Continue supportive care  Lymphadenopathy of the head and neck: CT findings concerning for malignancy lymphoma versus leukemia versus distant mets. -Recent MRI as above negative for mass, although study limited by motion degradation and incomplete exam - CT chest abdomen pelvis - Will need consult for biopsy to obtain tissue diagnosis - Will check LDH and Uric Acid in case of Lymphoma - Peripheral smear -  Supportive care  Anemia: Hemoglobin 7.8, down from 9.7 two days ago. Negative FOBT, do not suspect GI source. Possibly related to suspected malignancy based on CT neck. Will check labs for iron, B12, folate. We will also check peripheral smear and LDH. -Type and screen obtained in ED - Iron, ferritin, TIBC - B12, folate - LDH - Peripheral smear - Trend CBC  Elevated liver enzymes: Noted during recent admission.  Thought to be secondary to DILI.  Liver biopsy had been planned but not performed. Possibility of liver involvement with suspected malignancy. - Follow-up CT - Trend CMP  DVT prophylaxis: Lovenox  Code Status:  Full  Family Communication:  Wife, Olin Hauser, updated at bedside. And son updated by phone (was on the phone while updating wife)  Disposition Plan:   Patient is from:  Home  Anticipated DC to:  Home  Anticipated DC barriers: Mental status, further work-up Consults called:  GI, consulted by EDP Admission status:  Inpatient, telemetry  Severity of Illness: The appropriate patient status for this patient is INPATIENT. Inpatient status is judged to be reasonable and necessary in order to provide the required intensity of service to ensure the patient's safety. The patient's presenting symptoms, physical exam findings, and initial radiographic and laboratory data in the context of their chronic comorbidities is felt to place them at high risk for further clinical deterioration. Furthermore, it is not anticipated that the patient will be medically stable for discharge from the hospital within 2 midnights of admission. The following factors support the patient status of inpatient.   " The patient's presenting symptoms include fever, weight loss, lymphadenopathy, encephalopathy. " The worrisome physical exam findings include lymphadenopathy, encephalopathy. " The initial radiographic and laboratory data are worrisome because of lymphadenopathy concerning for malignancy   * I  certify that at the point of admission it is my clinical judgment that the patient will require inpatient hospital care spanning beyond 2 midnights from the point of admission due to high intensity of service, high risk for further deterioration and high frequency of surveillance required.Marcelyn Bruins MD [facility]  How to contact the [facility] Attending or Consulting provider [place] or covering provider during after hours [place], for this patient?   1. Check the care team in [facility] and look for a) attending/consulting [facility] provider listed and b) the [facility] team listed 2. Log into [email or web address] and use [place]'s universal password to access. If you do not have the password, please contact the hospital operator. 3. Locate the [facility] provider you are looking for under [facility] and page to a number that you can be directly reached. 4. If you still have difficulty reaching the provider, please page the [facility] (Director on Call) for the Hospitalists listed on amion for assistance.  01/01/2020, 8:23 PM

## 2020-01-01 NOTE — ED Provider Notes (Signed)
[facility] EM[facility]ENC[facility] DEPARTMENT Provider Note   CSN: [id number] Arrival date & time: 01/01/20  1503     History Chief Complaint  Patient presents with  . Altered Mental Status    Brian Ochoa is a 59 y.o. male.  Pt presents to the ED today with AMS.  Pt was a passenger in a car when he developed some AMS.  The driver called EMS.  Pt's bp was 96/60 when EMS arrived.  They started an IV and gave pt 300 cc of NS.  Pt denies any pain.  He did fall and hit his head yesterday.  He was admitted from 9/22-29 for AMS.  He was treated for bronchitis with Levaquin and had elevated LFTs.  He had a positive Hep C Ab, but RNA negative.  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  [place] yesterday was non-diagnostic.  CT neck recommended.  Pt was supposed to have a liver bx, but this did not get done.  He is scheduled to see GI on Monday, 10/11.        Past Medical History:  Diagnosis Date  . TIA (transient ischemic attack)     Patient Active Problem List   Diagnosis Date Noted  . Lymphadenopathy of head and neck region 12/30/2019  . Fever   . Sepsis ([place]) 12/15/2019  . Hepatitis C antibody positive in blood 12/15/2019  . Hyponatremia 12/15/2019  . Transaminitis 12/15/2019    No past surgical history on file.     No family history on file.  Social History   Tobacco Use  . Smoking status: Never Smoker  . Smokeless tobacco: Current User    Types: Chew  Substance Use Topics  . Alcohol use: Not Currently  . Drug use: Never    Home Medications Prior to Admission medications   Medication Sig Start Date End Date Taking? Authorizing Provider  acetaminophen (TYLENOL) 325 MG tablet Take 650 mg by mouth daily.   Yes [provider]  albuterol (VENTOLIN HFA) 108 (90 Base) MCG/ACT inhaler Inhale 2 puffs into the lungs every 6 (six) hours as needed for wheezing or shortness of breath.   Yes [provider]  aspirin 325 MG tablet Take 325 mg by mouth  daily.   Yes [provider]  benzonatate (TESSALON) 100 MG capsule Take 100-200 mg by mouth 3 (three) times daily as needed for cough.   Yes [provider]  Multiple Vitamin (MULTIVITAMIN) capsule Take 1 capsule by mouth daily.   Yes [provider]  pantoprazole (PROTONIX) 40 MG tablet Take 1 tablet (40 mg total) by mouth daily before breakfast. 12/22/19  Yes Amin, Jeanella Flattery, MD    Allergies    Patient has no known allergies.  Review of Systems   Review of Systems  All other systems reviewed and are negative.   Physical Exam Updated Vital Signs BP 96/63   Pulse 98   Temp 99.2 F (37.3 C) (Oral)   Resp (!) 29   Ht 5\' 11"  (1.803 m)   Wt 81.6 kg   SpO2 95%   BMI 25.10 kg/m   Physical Exam Vitals and nursing note reviewed.  Constitutional:      Appearance: Normal appearance.  HENT:     Head: Normocephalic.      Right Ear: External ear normal.     Left Ear: External ear normal.     Nose: Nose normal.     Mouth/Throat:     Mouth: Mucous  membranes are moist.     Pharynx: Oropharynx is clear.  Eyes:     Extraocular Movements: Extraocular movements intact.     Conjunctiva/sclera: Conjunctivae normal.     Pupils: Pupils are equal, round, and reactive to light.  Cardiovascular:     Rate and Rhythm: Regular rhythm. Tachycardia present.     Pulses: Normal pulses.     Heart sounds: Normal heart sounds.  Pulmonary:     Effort: Pulmonary effort is normal.     Breath sounds: Normal breath sounds.     Comments: Frequent cough on exam Abdominal:     General: Abdomen is flat. Bowel sounds are normal.     Palpations: Abdomen is soft.  Musculoskeletal:        General: Normal range of motion.     Cervical back: Normal range of motion and neck supple.  Lymphadenopathy:     Cervical: Cervical adenopathy present.  Skin:    General: Skin is warm.     Capillary Refill: Capillary refill takes less than 2 seconds.  Neurological:     Mental Status: He  is alert. He is disoriented.  Psychiatric:        Mood and Affect: Mood normal.        Behavior: Behavior normal.        Thought Content: Thought content normal.        Judgment: Judgment normal.     ED Results / Procedures / Treatments   Labs (all labs ordered are listed, but only abnormal results are displayed) Labs Reviewed  CBC WITH DIFFERENTIAL/PLATELET - Abnormal; Notable for the following components:      Result Value   RBC 3.11 (*)    Hemoglobin 7.8 (*)    HCT 26.7 (*)    MCH 25.1 (*)    MCHC 29.2 (*)    RDW 17.0 (*)    Lymphs Abs 0.6 (*)    Abs Immature Granulocytes 0.09 (*)    All other components within normal limits  COMPREHENSIVE METABOLIC PANEL - Abnormal; Notable for the following components:   Sodium 134 (*)    Glucose, Bld 138 (*)    Calcium 7.9 (*)    Total Protein 5.6 (*)    Albumin 1.7 (*)    AST 134 (*)    ALT 209 (*)    Alkaline Phosphatase 289 (*)    All other components within normal limits  URINALYSIS, ROUTINE W REFLEX MICROSCOPIC - Abnormal; Notable for the following components:   Color, Urine AMBER (*)    Specific Gravity, Urine 1.036 (*)    Protein, ur 30 (*)    Bacteria, UA RARE (*)    All other components within normal limits  PROTIME-INR - Abnormal; Notable for the following components:   Prothrombin Time 15.3 (*)    INR 1.3 (*)    All other components within normal limits  CBG MONITORING, ED - Abnormal; Notable for the following components:   Glucose-Capillary 148 (*)    All other components within normal limits  RESPIRATORY PANEL BY RT PCR (FLU A&B, COVID)  CULTURE, BLOOD (ROUTINE X 2)  CULTURE, BLOOD (ROUTINE X 2)  URINE CULTURE  AMMONIA  LACTIC ACID, PLASMA  LACTIC ACID, PLASMA  POC OCCULT BLOOD, ED  TYPE AND SCREEN  ABO/RH    EKG EKG Interpretation  Date/Time:  Saturday January 01 2020 15:11:15 EDT Ventricular Rate:  113 PR Interval:    QRS Duration: 87 QT Interval:  315 QTC Calculation: 432 R Axis:  57 Text  Interpretation: Sinus tachycardia Abnormal R-wave progression, early transition When compared with ECG of 12/15/2019, No significant change was found Confirmed by Delora Fuel ([id number]) on 01/01/2020 3:28:51 PM   Radiology CT Head Wo Contrast  Result Date: 01/01/2020 CLINICAL DATA:  Mental status change EXAM: CT HEAD WITHOUT CONTRAST TECHNIQUE: Contiguous axial images were obtained from the base of the skull through the vertex without intravenous contrast. COMPARISON:  MRI 12/16/2019, CT 12/15/2019 FINDINGS: Brain: No evidence of acute infarction, hemorrhage, hydrocephalus, extra-axial collection, visible mass lesion or mass effect. Basal cisterns are patent. Borderline low positioning of the right cerebellar tonsil projecting approximately 5 mm below the foramen magnum but with otherwise morphologically normal appearance of the posterior fossa. Vascular: No hyperdense vessel or unexpected calcification. Skull: No calvarial fracture or suspicious osseous lesion. No scalp swelling or hematoma. Sinuses/Orbits: Paranasal sinuses are clear. Left mastoid effusion. Right mastoid air cells are predominantly clear. Partial pneumatization of the petrous apices. Middle ear cavities are clear. Included orbital structures are unremarkable. Other: Leftward nasal septal deviation with a contacting left-sided nasal septal spur and prominent right concha bullosa. IMPRESSION: 1. No acute intracranial findings. 2. Low positioning of the right cerebellar tonsil projecting approximately 5 mm below the foramen magnum compatible with cerebellar tonsillar ectopia, nonspecific finding which can be asymptomatic or on the spectrum of Chiari 1 malformation. 3. Left mastoid effusion. 4. Leftward nasal septal deviation with a contacting left-sided nasal septal spur and prominent right concha bullosa. Electronically Signed   By: Lovena Le M.D.   On: 01/01/2020 17:34   CT Soft Tissue Neck W Contrast  Result Date: 01/01/2020 CLINICAL  DATA:  Lymphadenopathy.  Abnormal ultrasound. EXAM: CT NECK WITH CONTRAST TECHNIQUE: Multidetector CT imaging of the neck was performed using the standard protocol following the bolus administration of intravenous contrast. CONTRAST:  47mL OMNIPAQUE IOHEXOL 300 MG/ML  SOLN COMPARISON:  Soft tissue neck ultrasound 12/31/2019 FINDINGS: Pharynx and larynx: Study is mildly degraded by patient motion throughout. No focal mucosal or submucosal lesions are present. Nasopharynx is clear. Soft palate and tongue base are within normal limits. Epiglottis is normal. Hypopharynx is within normal limits. Vocal cord midline insert metric. The trachea is normal. Salivary glands: The submandibular and parotid glands and ducts are within normal limits. Thyroid: Negative Lymph nodes: Extensive left-sided adenopathy is present. Left submandibular node or conglomeration of nodes measures 3.6 x 1.9 x 1.7 cm. Enlarged level 2 and level 3 lymph nodes are present the largest left level 2 lymph node measures at least 2.5 x 1.8 cm. No primary lesion is identified. Vascular: No focal vascular lesions or significant atherosclerotic changes present. Limited intracranial: Within normal limits. Visualized orbits: The globes and orbits are within normal limits. Mastoids and visualized paranasal sinuses: The paranasal sinuses and mastoid air cells are clear. Skeleton: Degenerative changes are most evident C5-6 and C6-7. Straightening of the normal cervical lordosis is present. No focal lytic or blastic lesion is present. Upper chest: The lung apices are clear. The thoracic inlet is within normal limits. IMPRESSION: 1. Extensive left-sided adenopathy without a primary lesion. This is concerning for malignancy. This may represent metastatic disease of unknown primary versus lymphoma or leukemia. 2. No primary lesion is identified. 3. Degenerative changes of the cervical spine are most evident C5-6 and C6-7. Electronically Signed   By: San Morelle M.D.   On: 01/01/2020 17:42   US SOFT TISSUE HEAD & NECK (NON-THYROID)  Result Date: 12/31/2019 CLINICAL DATA:  Left-sided cervical lymphadenopathy.  Weakness and weight loss. EXAM: ULTRASOUND OF HEAD/NECK SOFT TISSUES TECHNIQUE: Ultrasound examination of the head and neck soft tissues was performed in the area of clinical concern. COMPARISON:  None. FINDINGS: Patient's palpable area of concern within the left neck correlates with several clustered potentially pathologically enlarged cervical lymph nodes (representative image 28). Dominant left-sided cervical lymph node measures 1.4 cm in greatest short axis diameter with obliteration of the fatty hilum (image 30). IMPRESSION: Patient's palpable area of concern within the left neck correlates with left cervical lymphadenopathy, nonspecific and while potentially infectious or inflammatory in etiology, malignancy is not excluded on the basis of this examination. Clinical correlation is advised. Further evaluation with contrast-enhanced neck CT could be performed as indicated. These results will be called to the ordering clinician or representative by the Radiologist Assistant, and communication documented in the PACS or [employer]. Electronically Signed   By: Sandi Mariscal M.D.   On: 12/31/2019 15:11   DG Chest Portable 1 View  Result Date: 01/01/2020 CLINICAL DATA:  Cough EXAM: PORTABLE CHEST 1 VIEW COMPARISON:  December 15, 2019 FINDINGS: The cardiomediastinal silhouette is unchanged in contour.RIGHT rounded retrocardiac opacity may reflect a hiatal hernia. No pleural effusion. No pneumothorax. No acute pleuroparenchymal abnormality. Visualized abdomen is unremarkable. Multilevel degenerative changes of the thoracic spine. IMPRESSION: No acute cardiopulmonary abnormality. Electronically Signed   By: Valentino Saxon MD   On: 01/01/2020 16:32    Procedures Procedures (including critical care time)  Medications Ordered in ED Medications   ibuprofen (ADVIL) tablet 600 mg (600 mg Oral Given 01/01/20 1651)  iohexol (OMNIPAQUE) 300 MG/ML solution 75 mL (75 mLs Intravenous Contrast Given 01/01/20 1725)  sodium chloride 0.9 % bolus 1,000 mL (1,000 mLs Intravenous New Bag/Given 01/01/20 1838)    ED Course  I have reviewed the triage vital signs and the nursing notes.  Pertinent labs & imaging results that were available during my care of the patient were reviewed by me and considered in my medical decision making (see chart for details).    MDM Rules/Calculators/A&P                          Pt is more alert after receiving IVFs.  His hgb has dropped from 9.7 on 10/7 to 7.8 today.  Stool is guaiac negative here.  No hx of black stool.  Pt d/w Dr. Loletha Carrow ([facility]) who is on call for Dr. Benson [place] this weekend.  Anemia is likely not from a GI source.  ? Cancer due to LAD on CT neck.  Rectal temp of 100.8 No source of fever.  ? B sx?  Lymph nodes need a bx.  BP is low, but improving with IVFs.  Pt's lactic is nl.  Recent septic work up which was negative.  Blood and urine cultures pending.  Doubt sepsis.   Pt d/w Dr. Trilby Drummer ([facility]) for admission.  CRITICAL CARE Performed by: Isla Pence   Total critical care time: 30 minutes  Critical care time was exclusive of separately billable procedures and treating other patients.  Critical care was necessary to treat or prevent imminent or life-threatening deterioration.  Critical care was time spent personally by me on the following activities: development of treatment plan with patient and/or surrogate as well as nursing, discussions with consultants, evaluation of patient's response to treatment, examination of patient, obtaining history from patient or surrogate, ordering and performing treatments and interventions, ordering and review of laboratory studies, ordering and review  of radiographic studies, pulse oximetry and re-evaluation of patient's condition.    Final Clinical  Impression(s) / ED Diagnoses Final diagnoses:  Altered mental status, unspecified altered mental status type  FUO (fever of unknown origin)  Cervical lymphadenopathy  Anemia, unspecified type  Elevated LFTs  Hypotension, unspecified hypotension type    Rx / DC Orders ED Discharge Orders    None       Isla Pence, MD 01/01/20 [facility] [place]

## 2020-01-02 ENCOUNTER — Inpatient Hospital Stay (HOSPITAL_COMMUNITY): Payer: Worker's Compensation

## 2020-01-02 DIAGNOSIS — R590 Localized enlarged lymph nodes: Secondary | ICD-10-CM

## 2020-01-02 DIAGNOSIS — R7989 Other specified abnormal findings of blood chemistry: Secondary | ICD-10-CM

## 2020-01-02 DIAGNOSIS — R4182 Altered mental status, unspecified: Secondary | ICD-10-CM

## 2020-01-02 DIAGNOSIS — D649 Anemia, unspecified: Secondary | ICD-10-CM

## 2020-01-02 LAB — APTT: aPTT: 30 seconds (ref 24–36)

## 2020-01-02 LAB — URINE CULTURE: Culture: NO GROWTH

## 2020-01-02 LAB — COMPREHENSIVE METABOLIC PANEL
ALT: 193 U/L — ABNORMAL HIGH (ref 0–44)
AST: 106 U/L — ABNORMAL HIGH (ref 15–41)
Albumin: 1.7 g/dL — ABNORMAL LOW (ref 3.5–5.0)
Alkaline Phosphatase: 243 U/L — ABNORMAL HIGH (ref 38–126)
Anion gap: 10 (ref 5–15)
BUN: 13 mg/dL (ref 6–20)
CO2: 25 mmol/L (ref 22–32)
Calcium: 8.1 mg/dL — ABNORMAL LOW (ref 8.9–10.3)
Chloride: 99 mmol/L (ref 98–111)
Creatinine, Ser: 0.76 mg/dL (ref 0.61–1.24)
GFR, Estimated: >60 mL/min (ref >60)
Glucose, Bld: 98 mg/dL (ref 70–99)
Potassium: 3.7 mmol/L (ref 3.5–5.1)
Sodium: 134 mmol/L — ABNORMAL LOW (ref 135–145)
Total Bilirubin: 0.9 mg/dL (ref 0.3–1.2)
Total Protein: 5.7 g/dL — ABNORMAL LOW (ref 6.5–8.1)

## 2020-01-02 LAB — FOLATE: Folate: 6.2 ng/mL

## 2020-01-02 LAB — CBC
HCT: 28.1 % — ABNORMAL LOW (ref 39.0–52.0)
Hemoglobin: 8.2 g/dL — ABNORMAL LOW (ref 13.0–17.0)
MCH: 24.6 pg — ABNORMAL LOW (ref 26.0–34.0)
MCHC: 29.2 g/dL — ABNORMAL LOW (ref 30.0–36.0)
MCV: 84.4 fL (ref 80.0–100.0)
Platelets: 242 K/uL (ref 150–400)
RBC: 3.33 MIL/uL — ABNORMAL LOW (ref 4.22–5.81)
RDW: 16.8 % — ABNORMAL HIGH (ref 11.5–15.5)
WBC: 7.1 K/uL (ref 4.0–10.5)
nRBC: 0 % (ref 0.0–0.2)

## 2020-01-02 LAB — IRON AND TIBC
Iron: 12 ug/dL — ABNORMAL LOW (ref 45–182)
Saturation Ratios: 6 % — ABNORMAL LOW (ref 17.9–39.5)
TIBC: 193 ug/dL — ABNORMAL LOW (ref 250–450)
UIBC: 181 ug/dL

## 2020-01-02 LAB — TSH: TSH: 0.351 u[IU]/mL (ref 0.350–4.500)

## 2020-01-02 LAB — VITAMIN B12: Vitamin B-12: 465 pg/mL (ref 180–914)

## 2020-01-02 LAB — FERRITIN: Ferritin: 3097 ng/mL — ABNORMAL HIGH (ref 24–336)

## 2020-01-02 LAB — PROTIME-INR
INR: 1.2 (ref 0.8–1.2)
Prothrombin Time: 14.9 s (ref 11.4–15.2)

## 2020-01-02 IMAGING — MR MR HEAD W/O CM
11 of 12 series · 42 of 48 positions shown · non-contrast
Comparison: Head CT [DATE].  MRI [DATE].

CLINICAL DATA: Delirium.  Confusion.  Mental status changes.

EXAM:
MRI HEAD WITHOUT CONTRAST
TECHNIQUE: Multiplanar, multiecho pulse sequences of the brain and surrounding
structures were obtained without intravenous contrast.

[Series 5: DWI · axial · 3.0mm · 0.88mm/px · z∈[-90,+44]mm · 4 of 48 slices shown (1 of 4)]
[im 1/48]
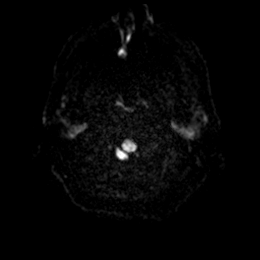
[im 16/48]
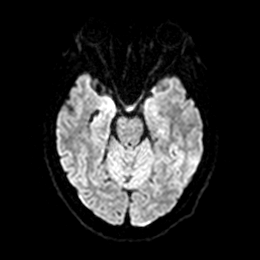
[im 32/48]
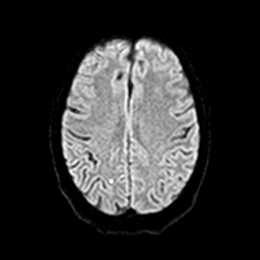
[im 48/48]
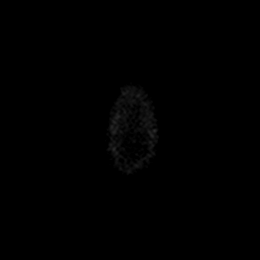

[Series 6: DWI · axial · 3.0mm · 0.88mm/px · z∈[-90,+44]mm · 4 of 48 slices shown (2 of 4)]
[im 1/48]
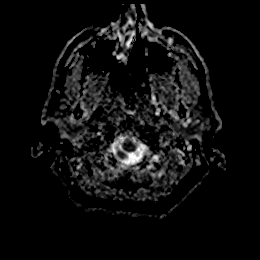
[im 16/48]
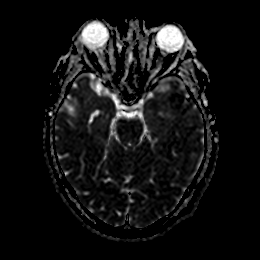
[im 32/48]
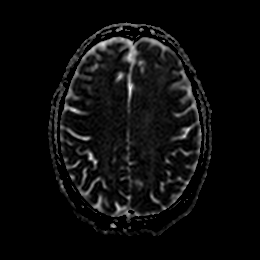
[im 48/48]
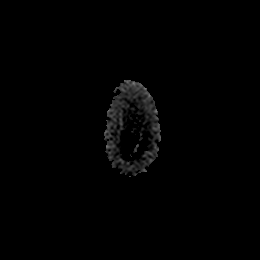

[Series 7: DWI · coronal · 4.0mm · 0.88mm/px · 4 of 37 slices shown (3 of 4)]
[im 1/37]
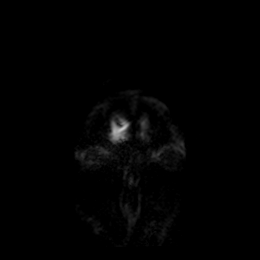
[im 13/37]
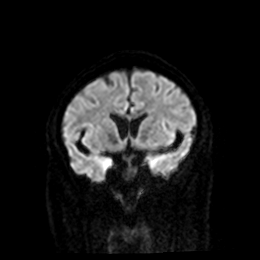
[im 25/37]
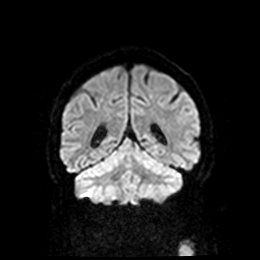
[im 37/37]
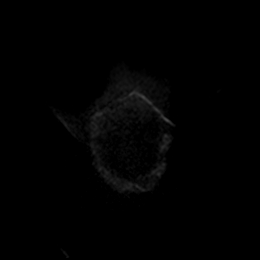

[Series 8: DWI · coronal · 4.0mm · 0.88mm/px · 4 of 37 slices shown (4 of 4)]
[im 1/37]
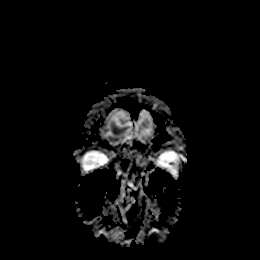
[im 13/37]
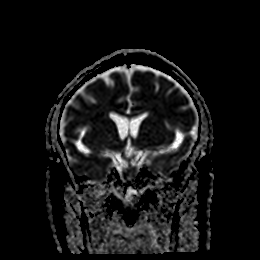
[im 25/37]
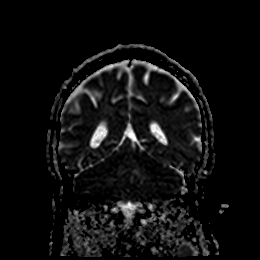
[im 37/37]
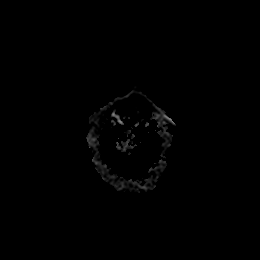

[Series 9: T1 · sagittal · 5.0mm · 0.94mm/px · 2 of 23 slices shown]
[im 1/23]
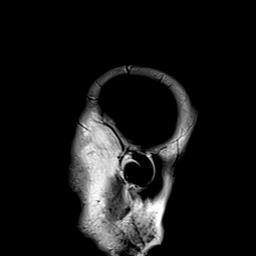
[im 23/23]
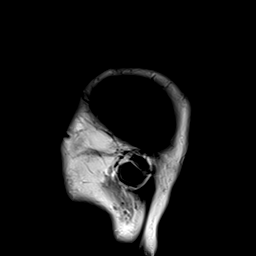

[Series 10: T2 · axial · 5.0mm · 0.90mm/px · z∈[-92,+46]mm · 2 of 25 slices shown (1 of 2)]
[im 1/25]
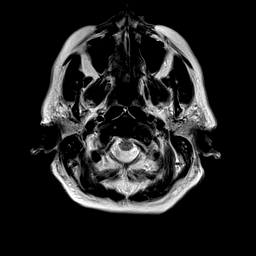
[im 25/25]
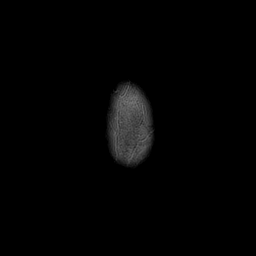

[Series 11: FLAIR · axial · 5.0mm · 0.45mm/px · z∈[-94,+43]mm · 2 of 25 slices shown]
[im 1/25]
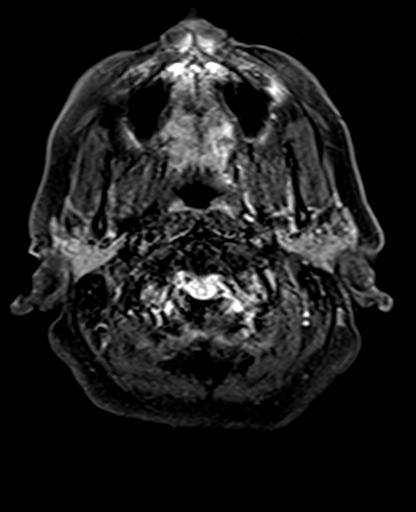
[im 25/25]
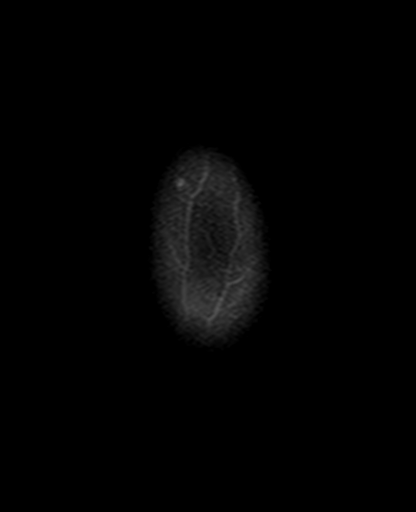

[Series 12: mag_images · axial · 3.0mm · 0.90mm/px · z∈[-110,+59]mm · 6 of 60 slices shown]
[im 1/60]
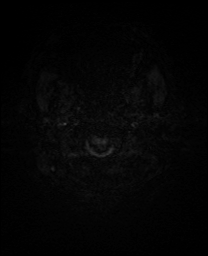
[im 12/60]
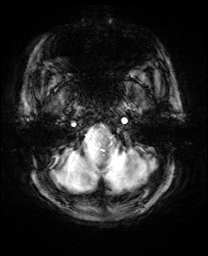
[im 24/60]
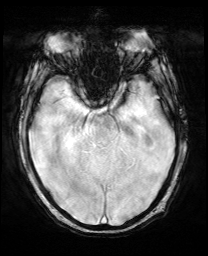
[im 36/60]
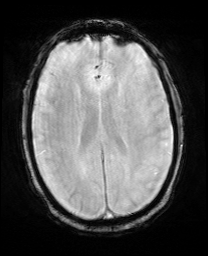
[im 48/60]
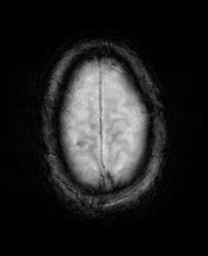
[im 60/60]
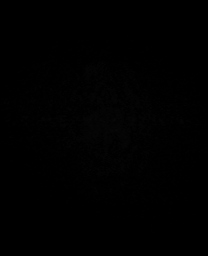

[Series 14: swi_images · axial · 3.0mm · 0.90mm/px · z∈[-110,+59]mm · 6 of 60 slices shown]
[im 1/60]
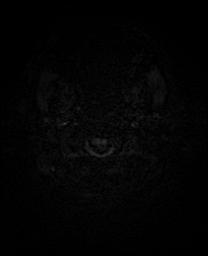
[im 12/60]
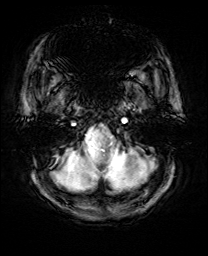
[im 24/60]
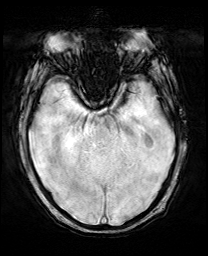
[im 36/60]
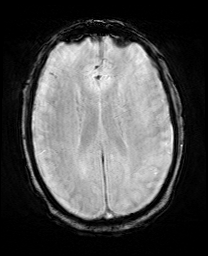
[im 48/60]
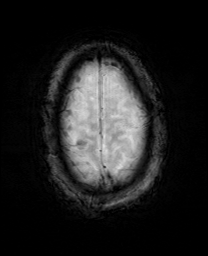
[im 60/60]
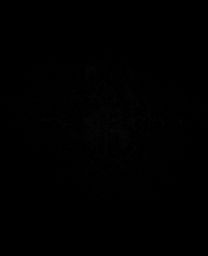

[Series 15: mip_images(sw) · axial · 24.0mm · 0.90mm/px · z∈[-100,+49]mm · 5 of 53 slices shown]
[im 1/53]
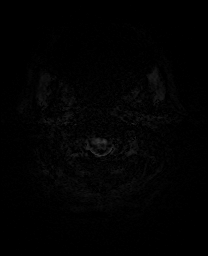
[im 14/53]
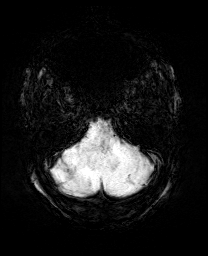
[im 27/53]
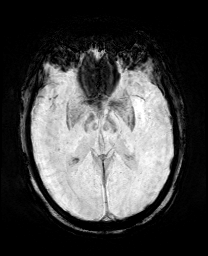
[im 40/53]
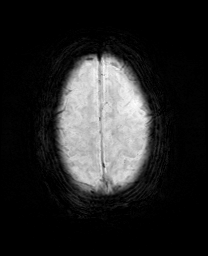
[im 53/53]
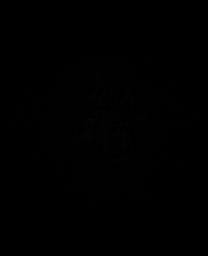

[Series 17: T2 · coronal · 5.0mm · 0.43mm/px · 3 of 31 slices shown (2 of 2)]
[im 1/31]
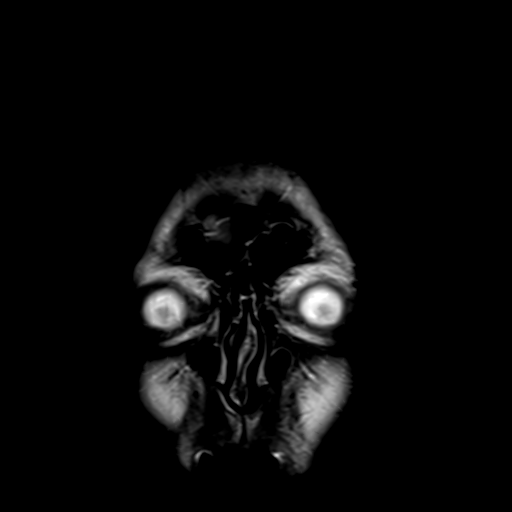
[im 16/31]
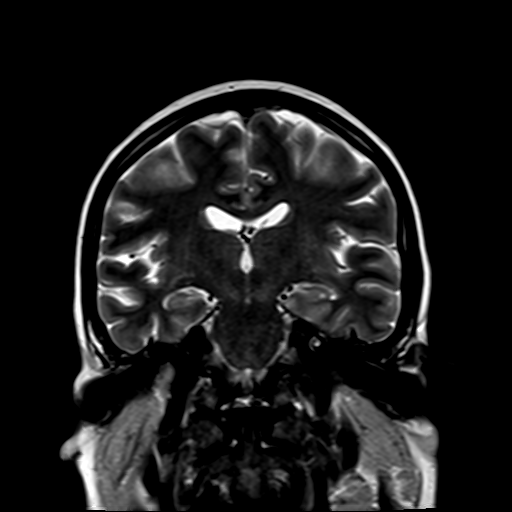
[im 31/31]
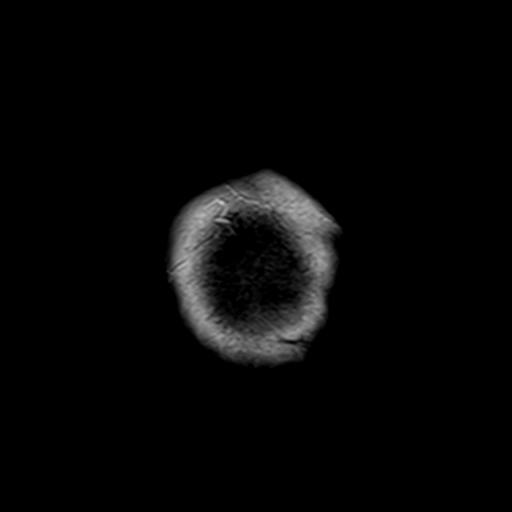

[42 of 48 positions shown; findings below may reference images not displayed]

FINDINGS: Brain: 7 punctate foci restricted diffusion seen scattered within
the cortical and subcortical brain of both hemispheres consistent
with small infarctions. No large confluent area of restricted
diffusion. Within both cerebral hemispheres, more extensive on the
left than the right, there are areas of abnormal T2 and FLAIR signal
affecting the cortical and subcortical brain. This is most notable
at the left frontal vertex and in the left lateral temporal lobe.
The last study was motion degraded and abbreviated, but the FLAIR
and T2 imaging is of reasonable quality in those changes do not
appear to have been present at that time. There is no evidence of
mass, hemorrhage, hydrocephalus or extra-axial collection. The
differential diagnosis is that of sequela of ischemic disease versus
is demyelinating disease versus autoimmune disease or vasculitis.

No sign of hemorrhage, hydrocephalus or extra-axial collection.
Small focus of hemosiderin in the left parietal cortical and
subcortical brain is 1 exception to that but is of uncertain
chronicity. I think one can appreciate some susceptibility effect in
this location on the diffusion imaging late [REDACTED].

Vascular: Major vessels at the base of the brain show flow.

Skull and upper cervical spine: Negative

Sinuses/Orbits: Sinuses are clear.  Orbits negative.

Other: Small mastoid effusions.
IMPRESSION: 1. 7 punctate foci of restricted diffusion seen scattered within the
cortical and subcortical brain of both hemispheres consistent with
small acute infarctions. Other areas indistinct T2 and FLAIR signal
affecting the cortical and subcortical brain, separate from those
areas of small restricted diffusion. This process is most notable at
the left frontal vertex and in the left lateral temporal lobe. The
differential diagnosis is that of ischemic disease versus
demyelinating disease versus autoimmune cerebritis versus infectious
cerebritis versus vasculitis.

## 2020-01-02 MED ORDER — SODIUM CHLORIDE 0.9 % IV BOLUS
500.0000 mL | Freq: Once | INTRAVENOUS | Status: AC
  Administered 2020-01-02: 500 mL via INTRAVENOUS

## 2020-01-02 MED ORDER — HALOPERIDOL LACTATE 5 MG/ML IJ SOLN
2.0000 mg | Freq: Four times a day (QID) | INTRAMUSCULAR | Status: DC | PRN
  Administered 2020-01-03 – 2020-01-05 (×3): 2 mg via INTRAVENOUS
  Filled 2020-01-02 (×3): qty 1

## 2020-01-02 NOTE — Progress Notes (Addendum)
   01/02/20 0453  Assess: MEWS Score  Temp 99.9 F (37.7 C)  BP 111/67  ECG Heart Rate (!) 128  Resp 20  Level of Consciousness Alert  O2 Device Room Air  Assess: MEWS Score  MEWS Temp 0  MEWS Systolic 0  MEWS Pulse 2  MEWS RR 0  MEWS LOC 0  MEWS Score 2  MEWS Score Color Yellow  Assess: if the MEWS score is Yellow or Red  Were vital signs taken at a resting state? Yes  Focused Assessment Change from prior assessment (see assessment flowsheet)  Early Detection of Sepsis Score *See Row Information* Low  MEWS guidelines implemented *See Row Information* Yes  Treat  MEWS Interventions Escalated (See documentation below)  Pain Scale 0-10  Pain Score 0  Faces Pain Scale 0  Complains of Other (Comment) (no complaints)  Interventions Relaxation  Patients response to intervention Relief  Take Vital Signs  Increase Vital Sign Frequency  Yellow: Q 2hr X 2 then Q 4hr X 2, if remains yellow, continue Q 4hrs  Escalate  MEWS: Escalate Yellow: discuss with charge nurse/RN and consider discussing with provider and RRT  Notify: Charge Nurse/RN  Name of Charge Nurse/RN Notified Fred CN  Date Charge Nurse/RN Notified 01/02/20  Time Charge Nurse/RN Notified 0503  Notify: Provider  Provider Name/Title Dr Marlowe Sax  Date Provider Notified 01/02/20  Time Provider Notified [phone]  Notification Type Page  Notification Reason Change in status  Response Other (Comment) (awaiting )  Document  Patient Outcome  (awaiting new order)  Progress note created (see row info) Yes   Received order from DR Rathore. Bolus ns 500cc and add on labs. Will monitor

## 2020-01-02 NOTE — Progress Notes (Signed)
[facility] PROGRESS NOTE    Progress Note  Brian Ochoa  ATF:[id number] DOB: [birth year] DOA: 01/01/2020 PCP: Aletha Halim., PA-C     Brief Narrative:   Brian Ochoa is an 59 y.o. male past medical history significant of TIA with a recent admission for encephalopathy discharged on 12/22/2019 presents again with altered mental status during previous hospitalization during her previous hospitalization she was found to be febrile treated for infectious etiology and encephalopathy resolved.  Of admission he became more confused so she called EMS on arrival she was found to have a blood pressure of 96/60 was given a bolus the wife also noted some lymphadenopathy in his neck showed Extensive left-sided adenopathy without a primary lesion.  Assessment/Plan:   Acute encephalopathy Work-up negative for sepsis when speaking to the family they said he never quite return to his baseline cognition. MRI of the brain done on 12/16/2019 was prematurely terminated due to motion degradation. His cognition has improved this morning, but not quite his baseline we will repeat an MRI of the brain.  Lymphadenopathy of the head and neck: Concerning for malignancy.  CT of the abdomen and pelvis there is extensive enlarged lymph nodes in the right axilla, left groin hepatosplenomegaly We will consult IR to perform a percutaneous core biopsy of the axillary or inguinal lymph nodes Consult oncology. Used to smoke he is up-to-date on his colonoscopies.  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 Ferritin of 3000 TIBC of 193 iron of 12.  B12 465  Elevated LFTs: Suspect DILI versus hematological malignancy. On previous admission it was suspected drug-induced.   DVT prophylaxis: lovenox Family Communication:son Status is: Inpatient  Remains inpatient appropriate because:Hemodynamically unstable   Dispo: The patient is from: Home              Anticipated d/c is to: Home               Anticipated d/c date is: 2 days              Patient currently is not medically stable to d/c.        Code Status:     Code Status Orders  (From admission, onward)         Start     Ordered   01/01/20 2012  Full code  Continuous        01/01/20 2016        Code Status History    Date Active Date Inactive Code Status Order ID Comments User Context   12/15/2019 2332 12/22/2019 2100 Full Code [id number]  [name], MD ED   Advance Care Planning Activity        IV Access:    Peripheral IV   Procedures and diagnostic studies:   CT Head Wo Contrast  Result Date: 01/01/2020 CLINICAL DATA:  Mental status change EXAM: CT HEAD WITHOUT CONTRAST TECHNIQUE: Contiguous axial images were obtained from the base of the skull through the vertex without intravenous contrast. COMPARISON:  MRI 12/16/2019, CT 12/15/2019 FINDINGS: Brain: No evidence of acute infarction, hemorrhage, hydrocephalus, extra-axial collection, visible mass lesion or mass effect. Basal cisterns are patent. Borderline low positioning of the right cerebellar tonsil projecting approximately 5 mm below the foramen magnum but with otherwise morphologically normal appearance of the posterior fossa. Vascular: No hyperdense vessel or unexpected calcification. Skull: No calvarial fracture or suspicious osseous lesion. No scalp swelling or hematoma. Sinuses/Orbits: Paranasal sinuses are clear. Left mastoid effusion. Right mastoid air cells are predominantly  clear. Partial pneumatization of the petrous apices. Middle ear cavities are clear. Included orbital structures are unremarkable. Other: Leftward nasal septal deviation with a contacting left-sided nasal septal spur and prominent right concha bullosa. IMPRESSION: 1. No acute intracranial findings. 2. Low positioning of the right cerebellar tonsil projecting approximately 5 mm below the foramen magnum compatible with cerebellar tonsillar ectopia, nonspecific  finding which can be asymptomatic or on the spectrum of Chiari 1 malformation. 3. Left mastoid effusion. 4. Leftward nasal septal deviation with a contacting left-sided nasal septal spur and prominent right concha bullosa. Electronically Signed   By: Lovena Le M.D.   On: 01/01/2020 17:34   CT Soft Tissue Neck W Contrast  Result Date: 01/01/2020 CLINICAL DATA:  Lymphadenopathy.  Abnormal ultrasound. EXAM: CT NECK WITH CONTRAST TECHNIQUE: Multidetector CT imaging of the neck was performed using the standard protocol following the bolus administration of intravenous contrast. CONTRAST:  41mL OMNIPAQUE IOHEXOL 300 MG/ML  SOLN COMPARISON:  Soft tissue neck ultrasound 12/31/2019 FINDINGS: Pharynx and larynx: Study is mildly degraded by patient motion throughout. No focal mucosal or submucosal lesions are present. Nasopharynx is clear. Soft palate and tongue base are within normal limits. Epiglottis is normal. Hypopharynx is within normal limits. Vocal cord midline insert metric. The trachea is normal. Salivary glands: The submandibular and parotid glands and ducts are within normal limits. Thyroid: Negative Lymph nodes: Extensive left-sided adenopathy is present. Left submandibular node or conglomeration of nodes measures 3.6 x 1.9 x 1.7 cm. Enlarged level 2 and level 3 lymph nodes are present the largest left level 2 lymph node measures at least 2.5 x 1.8 cm. No primary lesion is identified. Vascular: No focal vascular lesions or significant atherosclerotic changes present. Limited intracranial: Within normal limits. Visualized orbits: The globes and orbits are within normal limits. Mastoids and visualized paranasal sinuses: The paranasal sinuses and mastoid air cells are clear. Skeleton: Degenerative changes are most evident C5-6 and C6-7. Straightening of the normal cervical lordosis is present. No focal lytic or blastic lesion is present. Upper chest: The lung apices are clear. The thoracic inlet is within  normal limits. IMPRESSION: 1. Extensive left-sided adenopathy without a primary lesion. This is concerning for malignancy. This may represent metastatic disease of unknown primary versus lymphoma or leukemia. 2. No primary lesion is identified. 3. Degenerative changes of the cervical spine are most evident C5-6 and C6-7. Electronically Signed   By: San Morelle M.D.   On: 01/01/2020 17:42   US SOFT TISSUE HEAD & NECK (NON-THYROID)  Result Date: 12/31/2019 CLINICAL DATA:  Left-sided cervical lymphadenopathy. Weakness and weight loss. EXAM: ULTRASOUND OF HEAD/NECK SOFT TISSUES TECHNIQUE: Ultrasound examination of the head and neck soft tissues was performed in the area of clinical concern. COMPARISON:  None. FINDINGS: Patient's palpable area of concern within the left neck correlates with several clustered potentially pathologically enlarged cervical lymph nodes (representative image 28). Dominant left-sided cervical lymph node measures 1.4 cm in greatest short axis diameter with obliteration of the fatty hilum (image 30). IMPRESSION: Patient's palpable area of concern within the left neck correlates with left cervical lymphadenopathy, nonspecific and while potentially infectious or inflammatory in etiology, malignancy is not excluded on the basis of this examination. Clinical correlation is advised. Further evaluation with contrast-enhanced neck CT could be performed as indicated. These results will be called to the ordering clinician or representative by the Radiologist Assistant, and communication documented in the PACS or [employer]. Electronically Signed   By: Eldridge Abrahams.D.  On: 12/31/2019 15:11   CT CHEST ABDOMEN PELVIS W CONTRAST  Result Date: 01/01/2020 CLINICAL DATA:  Lymphadenopathy in the neck. Concern for metastatic disease. EXAM: CT CHEST, ABDOMEN, AND PELVIS WITH CONTRAST TECHNIQUE: Multidetector CT imaging of the chest, abdomen and pelvis was performed following the standard  protocol during bolus administration of intravenous contrast. CONTRAST:  134mL OMNIPAQUE IOHEXOL 300 MG/ML  SOLN COMPARISON:  None. FINDINGS: CT CHEST FINDINGS Cardiovascular: The heart size is unremarkable. There is a trace pericardial effusion. Minimal atherosclerotic changes are noted of the thoracic aorta. There is no dissection. There is no large centrally located pulmonary embolism. Detection of smaller pulmonary emboli is limited by contrast bolus timing and technique. Mediastinum/Nodes: -- No mediastinal lymphadenopathy. --there are few mildly prominent hilar lymph nodes. For example on the left there is a 9 mm hilar lymph node (axial series 3, image 27). --there is no significant axillary adenopathy on the left. Multiple pathologically enlarged axillary lymph nodes are noted on the right measuring up to approximately 2.7 x 5.6 cm. -- No supraclavicular lymphadenopathy. -- Normal thyroid gland where visualized. -  Unremarkable esophagus. Lungs/Pleura: There is a 3 mm pulmonary nodule in the right upper lobe (axial series 4, image 22). There is an additional 3 mm pulmonary nodule in the right upper lobe (axial series 4, image 35). There is a ground-glass airspace opacity in the right upper lobe measuring approximately 2.2 cm (axial series 4, image 49). There is a 5-6 mm pulmonary nodule in the right upper lobe (axial series 4, image 70). There is a 6 mm pulmonary nodule in the right middle lobe (axial series 4, image 104). There is atelectasis in the right lower lobe. Musculoskeletal: No chest wall abnormality. No bony spinal canal stenosis. Mild bilateral gynecomastia is noted. CT ABDOMEN PELVIS FINDINGS Hepatobiliary: There is hepatic steatosis. The liver is enlarged measuring approximately 19 cm craniocaudad. There is no discrete hepatic mass. Normal gallbladder.There is no biliary ductal dilation. Pancreas: Normal contours without ductal dilatation. No peripancreatic fluid collection. Spleen: The spleen  is significantly enlarged measuring approximately 19.4 cm craniocaudad. Adrenals/Urinary Tract: --Adrenal glands: Unremarkable. --Right kidney/ureter: There is a low-attenuation 2.1 cm structure in the right kidney measuring approximately 21 Hounsfield units. There is no hydronephrosis. --Left kidney/ureter: No hydronephrosis or radiopaque kidney stones. --Urinary bladder: Unremarkable. Stomach/Bowel: --Stomach/Duodenum: No hiatal hernia or other gastric abnormality. Normal duodenal course and caliber. --Small bowel: Unremarkable. --Colon: There are few scattered colonic diverticula without CT evidence for diverticulitis. --Appendix: Normal. Vascular/Lymphatic: Atherosclerotic calcification is present within the non-aneurysmal abdominal aorta, without hemodynamically significant stenosis. --No retroperitoneal lymphadenopathy. --No mesenteric lymphadenopathy. --there is a pathologically enlarged left inguinal lymph node measuring approximately 2.5 cm (axial series 3, image 124). Reproductive: The prostate gland is enlarged. Other: No ascites or free air. The abdominal wall is normal. Musculoskeletal. No acute displaced fractures. IMPRESSION: 1. Pathologically enlarged lymph nodes in the right axilla and left groin in addition to hepatosplenomegaly is concerning for a lymphoproliferative disorder such as lymphoma. The lymph nodes in the right axilla and left groin are readily amenable to percutaneous biopsy. 2. Hepatic steatosis. 3. Indeterminate 2.1 cm nodule in the right kidney. This is favored to represent a proteinaceous or hemorrhagic cyst. Follow-up with a nonemergent renal ultrasound as an outpatient is recommended for confirmation. 4. Multiple indeterminate pulmonary nodules are noted measuring up to approximately 6 mm. Follow-up non-contrast CT recommended at 3-6 months to confirm persistence. If unchanged, and solid component remains <6 mm, annual CT is recommended until  5 years of stability has been  established. If persistent these nodules should be considered highly suspicious if the solid component of the nodule is 6 mm or greater in size and enlarging. This recommendation follows the consensus statement: Guidelines for Management of Incidental Pulmonary Nodules Detected on CT Images: From the Fleischner Society 2017; Radiology 2017; 284:228-243. 5. There is a subtle 2.2 cm ground-glass opacity in the right upper lobe as detailed above. This is favored to represent an infectious or inflammatory process. Attention on follow-up CT is recommended. Aortic Atherosclerosis (ICD10-I70.0). Electronically Signed   By: Constance Holster M.D.   On: 01/01/2020 20:51   DG Chest Portable 1 View  Result Date: 01/01/2020 CLINICAL DATA:  Cough EXAM: PORTABLE CHEST 1 VIEW COMPARISON:  December 15, 2019 FINDINGS: The cardiomediastinal silhouette is unchanged in contour.RIGHT rounded retrocardiac opacity may reflect a hiatal hernia. No pleural effusion. No pneumothorax. No acute pleuroparenchymal abnormality. Visualized abdomen is unremarkable. Multilevel degenerative changes of the thoracic spine. IMPRESSION: No acute cardiopulmonary abnormality. Electronically Signed   By: Valentino Saxon MD   On: 01/01/2020 16:32     Medical Consultants:    None.  Anti-Infectives:  None  Subjective:    Brian Ochoa has no new complaints this morning  Objective:    Vitals:   01/01/20 2225 01/02/20 0004 01/02/20 0400 01/02/20 0453  BP: 103/71 96/64 111/67 111/67  Pulse: 96 100    Resp: 20 20  20   Temp: 97.9 F (36.6 C) 98.6 F (37 C) 99.9 F (37.7 C) 99.9 F (37.7 C)  TempSrc: Oral Oral Oral Oral  SpO2: 97% 95% 93%   Weight: 80.5 kg  80.7 kg   Height: 5\' 11"  (1.803 m)      SpO2: 93 %   Intake/Output Summary (Last 24 hours) at 01/02/2020 0718 Last data filed at 01/02/2020 0400 Gross per 24 hour  Intake 1000 ml  Output 600 ml  Net 400 ml   Filed Weights   01/01/20 1516 01/01/20 2225  01/02/20 0400  Weight: 81.6 kg 80.5 kg 80.7 kg    Exam: General exam: In no acute distress. Respiratory system: Good air movement and clear to auscultation. Cardiovascular system: S1 & S2 heard, RRR. No JVD. Gastrointestinal system: Abdomen is nondistended, soft and nontender.  Central nervous system: Alert and oriented. No focal neurological deficits. Extremities: No pedal edema. Skin: Left neck and groin lymphadenopathy Psychiatry: Judgment and insight did not appear normal he hesitates to answer questions and affect is flat   Data Reviewed:    Labs: Basic Metabolic Panel: Recent Labs  Lab 12/30/19 1615 12/30/19 1615 01/01/20 1538 01/02/20 0250  NA 131*  --  134* 134*  K 4.0   < > 3.7 3.7  CL 93*  --  99 99  CO2 26  --  25 25  GLUCOSE 127*  --  138* 98  BUN 13  --  14 13  CREATININE 0.76  --  0.88 0.76  CALCIUM 8.4*  --  7.9* 8.1*   < > = values in this interval not displayed.   GFR Estimated Creatinine Clearance: 108.5 mL/min (by C-G formula based on SCr of 0.76 mg/dL). Liver Function Tests: Recent Labs  Lab 12/30/19 1615 01/01/20 1538 01/02/20 0250  AST 132* 134* 106*  ALT 281* 209* 193*  ALKPHOS  --  289* 243*  BILITOT 1.3* 0.7 0.9  PROT 5.9* 5.6* 5.7*  ALBUMIN  --  1.7* 1.7*   No results for input(s): LIPASE, AMYLASE in  the last 168 hours. Recent Labs  Lab 01/01/20 1540  AMMONIA 29   Coagulation profile Recent Labs  Lab 01/01/20 1538 01/02/20 0250  INR 1.3* 1.2   COVID-19 Labs  Recent Labs    01/01/20 2247  FERRITIN 3,097*  LDH 282*    Lab Results  Component Value Date   SARSCOV2NAA NEGATIVE 01/01/2020   [place] NEGATIVE 12/15/2019    CBC: Recent Labs  Lab 12/30/19 1615 01/01/20 1538 01/01/20 2247 01/02/20 0250  WBC 10.0 7.7 5.1 7.1  NEUTROABS 8,010* 6.1  --   --   HGB 9.7* 7.8* 7.2* 8.2*  HCT 29.3* 26.7* 24.0* 28.1*  MCV 77.3* 85.9 84.8 84.4  PLT 329 252 196 242   Cardiac Enzymes: No results for input(s):  CKTOTAL, CKMB, CKMBINDEX, TROPONINI in the last 168 hours. BNP (last 3 results) No results for input(s): PROBNP in the last 8760 hours. CBG: Recent Labs  Lab 01/01/20 1516  GLUCAP 148*   D-Dimer: No results for input(s): DDIMER in the last 72 hours. Hgb A1c: No results for input(s): HGBA1C in the last 72 hours. Lipid Profile: No results for input(s): CHOL, HDL, LDLCALC, TRIG, CHOLHDL, LDLDIRECT in the last 72 hours. Thyroid function studies: No results for input(s): TSH, T4TOTAL, T3FREE, THYROIDAB in the last 72 hours.  Invalid input(s): FREET3 Anemia work up: Recent Labs    01/01/20 2247  VITAMINB12 465  FOLATE 6.2  FERRITIN 3,097*  TIBC 193*  IRON 12*   Sepsis Labs: Recent Labs  Lab 12/30/19 1615 01/01/20 1538 01/01/20 1541 01/01/20 2247 01/02/20 0250  WBC 10.0 7.7  --  5.1 7.1  LATICACIDVEN  --   --  1.5 1.0  --    Microbiology Recent Results (from the past 240 hour(s))  Respiratory Panel by RT PCR (Flu A&B, Covid) - Nasopharyngeal Swab     Status: None   Collection Time: 01/01/20  3:38 PM   Specimen: Nasopharyngeal Swab  Result Value Ref Range Status   SARS Coronavirus 2 by RT PCR NEGATIVE NEGATIVE Final    Comment: (NOTE) SARS-CoV-2 target nucleic acids are NOT DETECTED.  The SARS-CoV-2 RNA is generally detectable in upper respiratoy specimens during the acute phase of infection. The lowest concentration of SARS-CoV-2 viral copies this assay can detect is 131 copies/mL. A negative result does not preclude SARS-Cov-2 infection and should not be used as the sole basis for treatment or other patient management decisions. A negative result may occur with  improper specimen collection/handling, submission of specimen other than nasopharyngeal swab, presence of viral mutation(s) within the areas targeted by this assay, and inadequate number of viral copies (<131 copies/mL). A negative result must be combined with clinical observations, patient history, and  epidemiological information. The expected result is Negative.  Fact Sheet for Patients:  PinkCheek.be  Fact Sheet for Healthcare Providers:  GravelBags.it  This test is no t yet approved or cleared by the [place] FDA and  has been authorized for detection and/or diagnosis of SARS-CoV-2 by FDA under an Emergency Use Authorization (EUA). This EUA will remain  in effect (meaning this test can be used) for the duration of the COVID-19 declaration under Section 564(b)(1) of the Act, 21 U.S.C. section 360bbb-3(b)(1), unless the authorization is terminated or revoked sooner.     Influenza A by PCR NEGATIVE NEGATIVE Final   Influenza B by PCR NEGATIVE NEGATIVE Final    Comment: (NOTE) The Xpert Xpress SARS-CoV-2/FLU/RSV assay is intended as an aid in  the diagnosis of influenza  from Nasopharyngeal swab specimens and  should not be used as a sole basis for treatment. Nasal washings and  aspirates are unacceptable for Xpert Xpress SARS-CoV-2/FLU/RSV  testing.  Fact Sheet for Patients: PinkCheek.be  Fact Sheet for Healthcare Providers: GravelBags.it  This test is not yet approved or cleared by the [place] FDA and  has been authorized for detection and/or diagnosis of SARS-CoV-2 by  FDA under an Emergency Use Authorization (EUA). This EUA will remain  in effect (meaning this test can be used) for the duration of the  Covid-19 declaration under Section 564(b)(1) of the Act, 21  U.S.C. section 360bbb-3(b)(1), unless the authorization is  terminated or revoked. Performed at [facility], [place] [address], [place], [place] [address]      Medications:   . enoxaparin (LOVENOX) injection  40 mg Subcutaneous Q24H  . multivitamin with minerals  1 tablet Oral Daily  . pantoprazole  40 mg Oral QAC breakfast  . sodium chloride flush  3 mL Intravenous Q12H    Continuous Infusions: . sodium chloride    . lactated ringers 100 mL/hr at 01/01/20 2254      LOS: 1 day   Charlynne Cousins  [facility]  01/02/2020, 7:18 AM

## 2020-01-02 NOTE — Evaluation (Signed)
Physical Therapy Evaluation Patient Details Name: Brian Ochoa MRN: [id number] DOB: Jul 24, 1960 Today's Date: 01/02/2020   History of Present Illness  Pt is a 59 y.o. M with significant PMH of TIA and HCV who presents with acute encephalopathy, lymphadenopathy of head/neck (CT findings concerning for malignancy vs lymphoma vs leukemia vs mets). Brain MRI showing 7 punctate foci of restricted diffusion seen scattered within thecortical and subcortical brain of both hemispheres consistent with small acute infarctions.  Clinical Impression  Pt presents with decreased functional mobility secondary to poor cognition and balance deficits. Pt oriented to self and place; could not accurately state time or situation. Pt with decreased attention spans, at times not responding and requires repeated cues to perform motor tasks. Pt requiring min assist for bed mobility and transfers to standing. Pt frequently lying back down in between attempts and pulling covers over himself. HR peak 135 bpm. Pt presents as a high fall risk based on decreased safety awareness and history of falls. Recommending post acute rehab to address deficits, maximize functional mobility and decrease caregiver burden.     Follow Up Recommendations CIR;Supervision/Assistance - 24 hour    Equipment Recommendations  Other (comment) (TBA)    Recommendations for Other Services Rehab consult     Precautions / Restrictions Precautions Precautions: Fall Restrictions Weight Bearing Restrictions: No      Mobility  Bed Mobility Overal bed mobility: Needs Assistance Bed Mobility: Supine to Sit;Sit to Supine     Supine to sit: Min assist Sit to supine: Supervision   General bed mobility comments: MinA for initiation of BLE's out of bed. Supervision for transition back to supine. Attempted to sustain sitting on edge of bed for a time period, but pt repeatedly lying back down and pulling covers over himself  Transfers Overall  transfer level: Needs assistance Equipment used: None Transfers: Sit to/from Stand Sit to Stand: Min assist         General transfer comment: MinA to power up and steady  Ambulation/Gait             General Gait Details: Deferred as pt attempting to sit/lie back down  Stairs            Wheelchair Mobility    Modified Rankin (Stroke Patients Only)       Balance Overall balance assessment: Needs assistance Sitting-balance support: Feet supported Sitting balance-Leahy Scale: Fair     Standing balance support: During functional activity;No upper extremity supported Standing balance-Leahy Scale: Fair                               Pertinent Vitals/Pain Pain Assessment: Faces Faces Pain Scale: No hurt    Home Living Family/patient expects to be discharged to:: Private residence Living Arrangements: Spouse/significant other Available Help at Discharge: Family Type of Home: House Home Access: Stairs to enter         Additional Comments: Pt unable to provide additional info    Prior Function Level of Independence: Needs assistance   Gait / Transfers Assistance Needed: History of recent fall per chart review     Comments: Pt unable to provide     Hand Dominance        Extremity/Trunk Assessment   Upper Extremity Assessment Upper Extremity Assessment: Difficult to assess due to impaired cognition (moving spontaneously)    Lower Extremity Assessment Lower Extremity Assessment: Difficult to assess due to impaired cognition (moving spontaneously)    Cervical /  Trunk Assessment Cervical / Trunk Assessment: Normal  Communication   Communication: No difficulties  Cognition Arousal/Alertness: Awake/alert Behavior During Therapy: Restless Overall Cognitive Status: Impaired/Different from baseline Area of Impairment: Orientation;Attention;Following commands;Safety/judgement;Awareness;Problem solving                 Orientation  Level: Disoriented to;Time;Situation Current Attention Level: Focused   Following Commands: Follows one step commands inconsistently Safety/Judgement: Decreased awareness of safety;Decreased awareness of deficits Awareness: Intellectual Problem Solving: Requires verbal cues General Comments: Pt with 1-3 second attention span, follows 1 step commands intermittently with max cues/repetition. able to state name/birthday and place, stating month was September.       General Comments      Exercises     Assessment/Plan    PT Assessment Patient needs continued PT services  PT Problem List Decreased balance;Decreased mobility;Decreased cognition;Decreased safety awareness       PT Treatment Interventions DME instruction;Gait training;Functional mobility training;Therapeutic activities;Therapeutic exercise;Stair training;Balance training;Patient/family education    PT Goals (Current goals can be found in the Care Plan section)  Acute Rehab PT Goals Patient Stated Goal: unable PT Goal Formulation: Patient unable to participate in goal setting Time For Goal Achievement: 01/16/20 Potential to Achieve Goals: Good    Frequency Min 3X/week   Barriers to discharge        Co-evaluation               AM-PAC PT "6 Clicks" Mobility  Outcome Measure Help needed turning from your back to your side while in a flat bed without using bedrails?: None Help needed moving from lying on your back to sitting on the side of a flat bed without using bedrails?: A Little Help needed moving to and from a bed to a chair (including a wheelchair)?: A Little Help needed standing up from a chair using your arms (e.g., wheelchair or bedside chair)?: A Little Help needed to walk in hospital room?: A Lot Help needed climbing 3-5 steps with a railing? : A Lot 6 Click Score: 17    End of Session Equipment Utilized During Treatment: Gait belt Activity Tolerance: Other (comment) (limited due to decreased  cognition) Patient left: in bed;with call bell/phone within reach;with bed alarm set Nurse Communication: Mobility status PT Visit Diagnosis: Unsteadiness on feet (R26.81);History of falling (Z91.81);Difficulty in walking, not elsewhere classified (R26.2)    Time: 7026-3785 PT Time Calculation (min) (ACUTE ONLY): 22 min   Charges:   PT Evaluation $PT Eval Moderate Complexity: 1 Mod          Wyona Almas, PT, DPT Acute Rehabilitation Services Pager [phone] Office [phone]   Deno Etienne 01/02/2020, 4:07 PM

## 2020-01-02 NOTE — Consult Note (Signed)
Consultation [facility] patient we are covering for him over the weekend)  Referring Provider: Dr. Aileen Fass     Primary Care Physician:  Aletha Halim., PA-C Primary Gastroenterologist: Dr. Benson Norway     Reason for Consultation:   Altered mental status, elevated LFTs and anemia         HPI:   Brian Ochoa is a 59 y.o. male with a past medical history of TIA and HCV status post treatment in 2008 [facility], who was admitted to the hospital on 01/01/2020 with altered mental status.    12/16/2019 consult by Dr. Benson Norway for abnormal liver enzymes, altered mental status and history of HCV.  At that time AST noted to be 143, ALT 289, APTT 30, TB 0.7.  Right upper quadrant ultrasound showed hepatomegaly and steatosis.  Reported drinking minimal alcohol.  It was thought most likely his abnormal liver enzymes were related to a drug reaction, doxycycline was the probable cause.  They were improving.  And HCV RNA was checked and recommendations to follow liver panel.  His liver enzymes continue to fluctuate up and down and it was not clear if he truly had daily.  His alk phos continues to increase and his AST/ALT fluctuated.  There were signs of worsening synthetic function that his albumin is dropping.  INR mildly elevated.  It was recommended that he have a liver biopsy prior to discharge.    01/01/2020 patient admitted with altered mental status.  Apparently his wife and son reported that he never got back to baseline after time of discharge.  He had increasing fatigue, weight loss, decreased appetite for about 2 months and this worsened.      His wife reported that yesterday he began to have altered mental status and she called EMS.  On arrival to the ED his blood pressure is 96/16 he was given a 300 mL bolus bolus.  Noted to have ongoing lymphadenopathy in his neck, ultrasound done by infectious disease was inconclusive and a CT was done in the ED for further eval.  Condition improved in the ED.    Today,  patient's wife is by his bedside.  She tells me that he has never been himself since recent hospitalization in September and even for really the past 6 weeks.  Tells me he has been confused with an altered mental status, forgetting things with dizziness and a fall the other day.  Also describes occasional vomiting.  This worsened to the point where her son came to see him and decided he needed to come back to the hospital yesterday.    Denies fever, chills or abdominal pain.  ED course: Hemoglobin drop to 7.8 (9.7 at time of discharge), FOBT negative, CMP was stable LFT elevations: AST 130, ALT 209, ALP 289, T bili within normal limits, low protein and albumin, PT/INR elevated at 15 and 1.3 respectively, UA showing only 30 protein, chest x-ray unremarkable, CT head performed and remarkable for low positioning of right cerebral tonsil, CT soft tissue neck showed extensive adenopathy without a primary lesion concerning for malignancy (metastatic versus lymphoma/leukemia)  Past Medical History:  Diagnosis Date  . TIA (transient ischemic attack)     Family HIstory: No GI cancers  Social History   Tobacco Use  . Smoking status: Current Every Day Smoker    Types: Cigars  . Smokeless tobacco: Current User    Types: Chew  Substance Use Topics  . Alcohol use: Not Currently  . Drug use:  Never    Prior to Admission medications   Medication Sig Start Date End Date Taking? Authorizing Provider  acetaminophen (TYLENOL) 325 MG tablet Take 650 mg by mouth daily.   Yes [provider]  albuterol (VENTOLIN HFA) 108 (90 Base) MCG/ACT inhaler Inhale 2 puffs into the lungs every 6 (six) hours as needed for wheezing or shortness of breath.   Yes [provider]  aspirin 325 MG tablet Take 325 mg by mouth daily.   Yes [provider]  benzonatate (TESSALON) 100 MG capsule Take 100-200 mg by mouth 3 (three) times daily as needed for cough.   Yes [provider]  Multiple  Vitamin (MULTIVITAMIN) capsule Take 1 capsule by mouth daily.   Yes [provider]  pantoprazole (PROTONIX) 40 MG tablet Take 1 tablet (40 mg total) by mouth daily before breakfast. 12/22/19  Yes Amin, Jeanella Flattery, MD    Current Facility-Administered Medications  Medication Dose Route Frequency Provider Last Rate Last Admin  . 0.9 %  sodium chloride infusion  250 mL Intravenous PRN Marcelyn Bruins, MD      . acetaminophen (TYLENOL) tablet 650 mg  650 mg Oral Q6H PRN Marcelyn Bruins, MD       Or  . acetaminophen (TYLENOL) suppository 650 mg  650 mg Rectal Q6H PRN Marcelyn Bruins, MD      . albuterol (PROVENTIL) (2.5 MG/3ML) 0.083% nebulizer solution 3 mL  3 mL Inhalation Q6H PRN Marcelyn Bruins, MD      . enoxaparin (LOVENOX) injection 40 mg  40 mg Subcutaneous Q24H Marcelyn Bruins, MD   40 mg at 01/01/20 2249  . multivitamin with minerals tablet 1 tablet  1 tablet Oral Daily Marcelyn Bruins, MD   1 tablet at 01/02/20 0941  . pantoprazole (PROTONIX) EC tablet 40 mg  40 mg Oral QAC breakfast Marcelyn Bruins, MD   40 mg at 01/02/20 0941  . sodium chloride flush (NS) 0.9 % injection 3 mL  3 mL Intravenous Q12H Marcelyn Bruins, MD   3 mL at 01/02/20 0942  . sodium chloride flush (NS) 0.9 % injection 3 mL  3 mL Intravenous PRN Marcelyn Bruins, MD        Allergies as of 01/01/2020  . (No Known Allergies)     Review of Systems:    Constitutional: No fever or chills Skin: No rash  Cardiovascular: No chest pain  Respiratory: No SOB Gastrointestinal: See HPI and otherwise negative Genitourinary: No dysuria or change in urinary frequency Neurological: No headache Musculoskeletal: No new muscle or joint pain Hematologic: No bleeding or bruising Psychiatric: No history of depression or anxiety     Physical Exam:  Vital signs in last 24 hours: Temp:  [97.9 F (36.6 C)-99.9 F (37.7 C)] 99.5 F (37.5 C) (10/10 0727) Pulse Rate:  [91-117] 115  (10/10 0727) Resp:  [20-36] 20 (10/10 0727) BP: (96-123)/(46-86) 115/68 (10/10 0727) SpO2:  [93 %-98 %] 96 % (10/10 0727) Weight:  [80.5 kg-81.6 kg] 80.7 kg (10/10 0803)   General:   Pleasant Caucasian male appears to be in NAD falling asleep during time of my exam, Well developed, Well nourished, alert and cooperative Head:  Normocephalic and atraumatic. Eyes:   PEERL, EOMI. No icterus. Conjunctiva pink. Ears:  Normal auditory acuity. Neck:  Supple Throat: Oral cavity and pharynx without inflammation, swelling or lesion.  Lungs: Respirations even and unlabored. Lungs clear to auscultation bilaterally.   No wheezes, crackles, or  rhonchi.  Heart: Normal S1, S2. No MRG. Regular rate and rhythm. No peripheral edema, cyanosis or pallor.  Abdomen:  Soft, nondistended, nontender. No rebound or guarding. Normal bowel sounds. No appreciable masses or hepatomegaly. Rectal:  Not performed.  Msk:  Symmetrical without gross deformities. Peripheral pulses intact.  Extremities:  Without edema, no deformity or joint abnormality. Neurologic:  Alert and  oriented x4;  grossly normal neurologically. NO asterixis Skin:   Dry and intact without significant lesions or rashes. Psychiatric: Demonstrates good judgement and reason without abnormal affect or behaviors.   LAB RESULTS: Recent Labs    01/01/20 1538 01/01/20 2247 01/02/20 0250  WBC 7.7 5.1 7.1  HGB 7.8* 7.2* 8.2*  HCT 26.7* 24.0* 28.1*  PLT 252 196 242   BMET Recent Labs    12/30/19 1615 01/01/20 1538 01/02/20 0250  NA 131* 134* 134*  K 4.0 3.7 3.7  CL 93* 99 99  CO2 _0 GLUCOSE 127* 138* 98  BUN _1 CREATININE 0.76 0.88 0.76  CALCIUM 8.4* 7.9* 8.1*   LFT Recent Labs    01/02/20 0250  PROT 5.7*  ALBUMIN 1.7*  AST 106*  ALT 193*  ALKPHOS 243*  BILITOT 0.9   PT/INR Recent Labs    01/01/20 1538 01/02/20 0250  LABPROT 15.3* 14.9  INR 1.3* 1.2    STUDIES: CT Head Wo Contrast  Result Date:  01/01/2020 CLINICAL DATA:  Mental status change EXAM: CT HEAD WITHOUT CONTRAST TECHNIQUE: Contiguous axial images were obtained from the base of the skull through the vertex without intravenous contrast. COMPARISON:  MRI 12/16/2019, CT 12/15/2019 FINDINGS: Brain: No evidence of acute infarction, hemorrhage, hydrocephalus, extra-axial collection, visible mass lesion or mass effect. Basal cisterns are patent. Borderline low positioning of the right cerebellar tonsil projecting approximately 5 mm below the foramen magnum but with otherwise morphologically normal appearance of the posterior fossa. Vascular: No hyperdense vessel or unexpected calcification. Skull: No calvarial fracture or suspicious osseous lesion. No scalp swelling or hematoma. Sinuses/Orbits: Paranasal sinuses are clear. Left mastoid effusion. Right mastoid air cells are predominantly clear. Partial pneumatization of the petrous apices. Middle ear cavities are clear. Included orbital structures are unremarkable. Other: Leftward nasal septal deviation with a contacting left-sided nasal septal spur and prominent right concha bullosa. IMPRESSION: 1. No acute intracranial findings. 2. Low positioning of the right cerebellar tonsil projecting approximately 5 mm below the foramen magnum compatible with cerebellar tonsillar ectopia, nonspecific finding which can be asymptomatic or on the spectrum of Chiari 1 malformation. 3. Left mastoid effusion. 4. Leftward nasal septal deviation with a contacting left-sided nasal septal spur and prominent right concha bullosa. Electronically Signed   By: Lovena Le M.D.   On: 01/01/2020 17:34   CT Soft Tissue Neck W Contrast  Result Date: 01/01/2020 CLINICAL DATA:  Lymphadenopathy.  Abnormal ultrasound. EXAM: CT NECK WITH CONTRAST TECHNIQUE: Multidetector CT imaging of the neck was performed using the standard protocol following the bolus administration of intravenous contrast. CONTRAST:  57m OMNIPAQUE IOHEXOL 300  MG/ML  SOLN COMPARISON:  Soft tissue neck ultrasound 12/31/2019 FINDINGS: Pharynx and larynx: Study is mildly degraded by patient motion throughout. No focal mucosal or submucosal lesions are present. Nasopharynx is clear. Soft palate and tongue base are within normal limits. Epiglottis is normal. Hypopharynx is within normal limits. Vocal cord midline insert metric. The trachea is normal. Salivary glands: The submandibular and parotid glands and ducts are within normal limits. Thyroid: Negative Lymph nodes: Extensive left-sided  adenopathy is present. Left submandibular node or conglomeration of nodes measures 3.6 x 1.9 x 1.7 cm. Enlarged level 2 and level 3 lymph nodes are present the largest left level 2 lymph node measures at least 2.5 x 1.8 cm. No primary lesion is identified. Vascular: No focal vascular lesions or significant atherosclerotic changes present. Limited intracranial: Within normal limits. Visualized orbits: The globes and orbits are within normal limits. Mastoids and visualized paranasal sinuses: The paranasal sinuses and mastoid air cells are clear. Skeleton: Degenerative changes are most evident C5-6 and C6-7. Straightening of the normal cervical lordosis is present. No focal lytic or blastic lesion is present. Upper chest: The lung apices are clear. The thoracic inlet is within normal limits. IMPRESSION: 1. Extensive left-sided adenopathy without a primary lesion. This is concerning for malignancy. This may represent metastatic disease of unknown primary versus lymphoma or leukemia. 2. No primary lesion is identified. 3. Degenerative changes of the cervical spine are most evident C5-6 and C6-7. Electronically Signed   By: San Morelle M.D.   On: 01/01/2020 17:42   MR BRAIN WO CONTRAST  Result Date: 01/02/2020 CLINICAL DATA:  Delirium.  Confusion.  Mental status changes. EXAM: MRI HEAD WITHOUT CONTRAST TECHNIQUE: Multiplanar, multiecho pulse sequences of the brain and surrounding  structures were obtained without intravenous contrast. COMPARISON:  Head CT 01/01/2020.  MRI 12/16/2019. FINDINGS: Brain: 7 punctate foci restricted diffusion seen scattered within the cortical and subcortical brain of both hemispheres consistent with small infarctions. No large confluent area of restricted diffusion. Within both cerebral hemispheres, more extensive on the left than the right, there are areas of abnormal T2 and FLAIR signal affecting the cortical and subcortical brain. This is most notable at the left frontal vertex and in the left lateral temporal lobe. The last study was motion degraded and abbreviated, but the FLAIR and T2 imaging is of reasonable quality in those changes do not appear to have been present at that time. There is no evidence of mass, hemorrhage, hydrocephalus or extra-axial collection. The differential diagnosis is that of sequela of ischemic disease versus is demyelinating disease versus autoimmune disease or vasculitis. No sign of hemorrhage, hydrocephalus or extra-axial collection. Small focus of hemosiderin in the left parietal cortical and subcortical brain is 1 exception to that but is of uncertain chronicity. I think one can appreciate some susceptibility effect in this location on the diffusion imaging late September. Vascular: Major vessels at the base of the brain show flow. Skull and upper cervical spine: Negative Sinuses/Orbits: Sinuses are clear.  Orbits negative. Other: Small mastoid effusions. IMPRESSION: 1. 7 punctate foci of restricted diffusion seen scattered within the cortical and subcortical brain of both hemispheres consistent with small acute infarctions. Other areas indistinct T2 and FLAIR signal affecting the cortical and subcortical brain, separate from those areas of small restricted diffusion. This process is most notable at the left frontal vertex and in the left lateral temporal lobe. The differential diagnosis is that of ischemic disease versus  demyelinating disease versus autoimmune cerebritis versus infectious cerebritis versus vasculitis. Electronically Signed   By: Nelson Chimes M.D.   On: 01/02/2020 09:39   US SOFT TISSUE HEAD & NECK (NON-THYROID)  Result Date: 12/31/2019 CLINICAL DATA:  Left-sided cervical lymphadenopathy. Weakness and weight loss. EXAM: ULTRASOUND OF HEAD/NECK SOFT TISSUES TECHNIQUE: Ultrasound examination of the head and neck soft tissues was performed in the area of clinical concern. COMPARISON:  None. FINDINGS: Patient's palpable area of concern within the left neck correlates with several  clustered potentially pathologically enlarged cervical lymph nodes (representative image 28). Dominant left-sided cervical lymph node measures 1.4 cm in greatest short axis diameter with obliteration of the fatty hilum (image 30). IMPRESSION: Patient's palpable area of concern within the left neck correlates with left cervical lymphadenopathy, nonspecific and while potentially infectious or inflammatory in etiology, malignancy is not excluded on the basis of this examination. Clinical correlation is advised. Further evaluation with contrast-enhanced neck CT could be performed as indicated. These results will be called to the ordering clinician or representative by the Radiologist Assistant, and communication documented in the PACS or [employer]. Electronically Signed   By: Sandi Mariscal M.D.   On: 12/31/2019 15:11   CT CHEST ABDOMEN PELVIS W CONTRAST  Result Date: 01/01/2020 CLINICAL DATA:  Lymphadenopathy in the neck. Concern for metastatic disease. EXAM: CT CHEST, ABDOMEN, AND PELVIS WITH CONTRAST TECHNIQUE: Multidetector CT imaging of the chest, abdomen and pelvis was performed following the standard protocol during bolus administration of intravenous contrast. CONTRAST:  144m OMNIPAQUE IOHEXOL 300 MG/ML  SOLN COMPARISON:  None. FINDINGS: CT CHEST FINDINGS Cardiovascular: The heart size is unremarkable. There is a trace  pericardial effusion. Minimal atherosclerotic changes are noted of the thoracic aorta. There is no dissection. There is no large centrally located pulmonary embolism. Detection of smaller pulmonary emboli is limited by contrast bolus timing and technique. Mediastinum/Nodes: -- No mediastinal lymphadenopathy. --there are few mildly prominent hilar lymph nodes. For example on the left there is a 9 mm hilar lymph node (axial series 3, image 27). --there is no significant axillary adenopathy on the left. Multiple pathologically enlarged axillary lymph nodes are noted on the right measuring up to approximately 2.7 x 5.6 cm. -- No supraclavicular lymphadenopathy. -- Normal thyroid gland where visualized. -  Unremarkable esophagus. Lungs/Pleura: There is a 3 mm pulmonary nodule in the right upper lobe (axial series 4, image 22). There is an additional 3 mm pulmonary nodule in the right upper lobe (axial series 4, image 35). There is a ground-glass airspace opacity in the right upper lobe measuring approximately 2.2 cm (axial series 4, image 49). There is a 5-6 mm pulmonary nodule in the right upper lobe (axial series 4, image 70). There is a 6 mm pulmonary nodule in the right middle lobe (axial series 4, image 104). There is atelectasis in the right lower lobe. Musculoskeletal: No chest wall abnormality. No bony spinal canal stenosis. Mild bilateral gynecomastia is noted. CT ABDOMEN PELVIS FINDINGS Hepatobiliary: There is hepatic steatosis. The liver is enlarged measuring approximately 19 cm craniocaudad. There is no discrete hepatic mass. Normal gallbladder.There is no biliary ductal dilation. Pancreas: Normal contours without ductal dilatation. No peripancreatic fluid collection. Spleen: The spleen is significantly enlarged measuring approximately 19.4 cm craniocaudad. Adrenals/Urinary Tract: --Adrenal glands: Unremarkable. --Right kidney/ureter: There is a low-attenuation 2.1 cm structure in the right kidney measuring  approximately 21 Hounsfield units. There is no hydronephrosis. --Left kidney/ureter: No hydronephrosis or radiopaque kidney stones. --Urinary bladder: Unremarkable. Stomach/Bowel: --Stomach/Duodenum: No hiatal hernia or other gastric abnormality. Normal duodenal course and caliber. --Small bowel: Unremarkable. --Colon: There are few scattered colonic diverticula without CT evidence for diverticulitis. --Appendix: Normal. Vascular/Lymphatic: Atherosclerotic calcification is present within the non-aneurysmal abdominal aorta, without hemodynamically significant stenosis. --No retroperitoneal lymphadenopathy. --No mesenteric lymphadenopathy. --there is a pathologically enlarged left inguinal lymph node measuring approximately 2.5 cm (axial series 3, image 124). Reproductive: The prostate gland is enlarged. Other: No ascites or free air. The abdominal wall is normal. Musculoskeletal. No acute  displaced fractures. IMPRESSION: 1. Pathologically enlarged lymph nodes in the right axilla and left groin in addition to hepatosplenomegaly is concerning for a lymphoproliferative disorder such as lymphoma. The lymph nodes in the right axilla and left groin are readily amenable to percutaneous biopsy. 2. Hepatic steatosis. 3. Indeterminate 2.1 cm nodule in the right kidney. This is favored to represent a proteinaceous or hemorrhagic cyst. Follow-up with a nonemergent renal ultrasound as an outpatient is recommended for confirmation. 4. Multiple indeterminate pulmonary nodules are noted measuring up to approximately 6 mm. Follow-up non-contrast CT recommended at 3-6 months to confirm persistence. If unchanged, and solid component remains <6 mm, annual CT is recommended until 5 years of stability has been established. If persistent these nodules should be considered highly suspicious if the solid component of the nodule is 6 mm or greater in size and enlarging. This recommendation follows the consensus statement: Guidelines for  Management of Incidental Pulmonary Nodules Detected on CT Images: From the Fleischner Society 2017; Radiology 2017; 284:228-243. 5. There is a subtle 2.2 cm ground-glass opacity in the right upper lobe as detailed above. This is favored to represent an infectious or inflammatory process. Attention on follow-up CT is recommended. Aortic Atherosclerosis (ICD10-I70.0). Electronically Signed   By: Constance Holster M.D.   On: 01/01/2020 20:51   DG Chest Portable 1 View  Result Date: 01/01/2020 CLINICAL DATA:  Cough EXAM: PORTABLE CHEST 1 VIEW COMPARISON:  December 15, 2019 FINDINGS: The cardiomediastinal silhouette is unchanged in contour.RIGHT rounded retrocardiac opacity may reflect a hiatal hernia. No pleural effusion. No pneumothorax. No acute pleuroparenchymal abnormality. Visualized abdomen is unremarkable. Multilevel degenerative changes of the thoracic spine. IMPRESSION: No acute cardiopulmonary abnormality. Electronically Signed   By: Valentino Saxon MD   On: 01/01/2020 16:32     Impression / Plan:   Impression: 1.  Acute encephalopathy: Uncertain etiology 2.  Lymphadenopathy of the head neck: CT findings concerning for malignancy versus lymphoma versus leukemia versus mets 3.  Anemia: Hemoglobin down from 9.7 on 12/31/2018 1-7.8, negative FOBT, question if related to suspected malignancy 4.  Elevated LFTs: Noted during recent admission, thought possibly due to [facility] with doxycycline, liver biopsy was planned but not performed, now with signs of diffuse lymphadenopathy with question of lymphoma  Plan: 1.  Discussed case with Dr. Havery Moros today.  Patient is needing further work-up of possible lymphoma with likely lymph node biopsy.  We will let the primary team handle this. 2.  No further recommendations at this moment in regards to elevated LFTs etc. as patient undergoes workup for lymphoma 3.  Patient's care will be taken over by Dr. Benson Norway tomorrow.  Thank you for kind consultation,  please await any further recommendations from Dr. Havery Moros later today.  Thank you for your kind consultation, we will continue to follow.  Lavone Nian Diana Armijo  01/02/2020, 10:19 AM

## 2020-01-03 ENCOUNTER — Inpatient Hospital Stay (HOSPITAL_COMMUNITY): Payer: Worker's Compensation

## 2020-01-03 ENCOUNTER — Encounter (HOSPITAL_COMMUNITY): Payer: Self-pay | Admitting: Internal Medicine

## 2020-01-03 ENCOUNTER — Inpatient Hospital Stay (HOSPITAL_COMMUNITY): Payer: Self-pay

## 2020-01-03 ENCOUNTER — Telehealth: Payer: Self-pay | Admitting: Family

## 2020-01-03 DIAGNOSIS — R590 Localized enlarged lymph nodes: Secondary | ICD-10-CM

## 2020-01-03 LAB — CBC
HCT: 24.4 % — ABNORMAL LOW (ref 39.0–52.0)
Hemoglobin: 7.2 g/dL — ABNORMAL LOW (ref 13.0–17.0)
MCH: 24.2 pg — ABNORMAL LOW (ref 26.0–34.0)
MCHC: 29.5 g/dL — ABNORMAL LOW (ref 30.0–36.0)
MCV: 82.2 fL (ref 80.0–100.0)
Platelets: 248 10*3/uL (ref 150–400)
RBC: 2.97 MIL/uL — ABNORMAL LOW (ref 4.22–5.81)
RDW: 17.3 % — ABNORMAL HIGH (ref 11.5–15.5)
WBC: 8 10*3/uL (ref 4.0–10.5)
nRBC: 0 % (ref 0.0–0.2)

## 2020-01-03 LAB — RENAL FUNCTION PANEL
Albumin: 1.6 g/dL — ABNORMAL LOW (ref 3.5–5.0)
Anion gap: 10 (ref 5–15)
BUN: 9 mg/dL (ref 6–20)
CO2: 24 mmol/L (ref 22–32)
Calcium: 8 mg/dL — ABNORMAL LOW (ref 8.9–10.3)
Chloride: 99 mmol/L (ref 98–111)
Creatinine, Ser: 0.8 mg/dL (ref 0.61–1.24)
GFR, Estimated: >60 mL/min (ref >60)
Glucose, Bld: 112 mg/dL — ABNORMAL HIGH (ref 70–99)
Phosphorus: 5.3 mg/dL — ABNORMAL HIGH (ref 2.5–4.6)
Potassium: 4 mmol/L (ref 3.5–5.1)
Sodium: 133 mmol/L — ABNORMAL LOW (ref 135–145)

## 2020-01-03 LAB — AMMONIA: Ammonia: 26 umol/L (ref 9–35)

## 2020-01-03 LAB — BLOOD GAS, VENOUS
Acid-Base Excess: 3.4 mmol/L — ABNORMAL HIGH (ref 0.0–2.0)
Bicarbonate: 27 mmol/L (ref 20.0–28.0)
FIO2: 28
O2 Saturation: 95.5 %
Patient temperature: 37
pCO2, Ven: 38.2 mmHg — ABNORMAL LOW (ref 44.0–60.0)
pH, Ven: 7.464 — ABNORMAL HIGH (ref 7.250–7.430)

## 2020-01-03 LAB — PROTIME-INR
INR: 1.3 — ABNORMAL HIGH (ref 0.8–1.2)
Prothrombin Time: 15.4 seconds — ABNORMAL HIGH (ref 11.4–15.2)

## 2020-01-03 LAB — APTT: aPTT: 32 seconds (ref 24–36)

## 2020-01-03 MED ORDER — HALOPERIDOL LACTATE 5 MG/ML IJ SOLN: 5.0000 mg | Freq: Once | INTRAMUSCULAR | Status: DC

## 2020-01-03 MED ORDER — LORAZEPAM 2 MG/ML IJ SOLN
1.0000 mg | INTRAMUSCULAR | Status: DC | PRN
  Administered 2020-01-03 – 2020-01-04 (×3): 1 mg via INTRAVENOUS
  Filled 2020-01-03 (×3): qty 1

## 2020-01-03 MED ORDER — THIAMINE HCL 100 MG/ML IJ SOLN
100.0000 mg | Freq: Every day | INTRAMUSCULAR | Status: DC
  Administered 2020-01-04 – 2020-01-05 (×3): 100 mg via INTRAVENOUS
  Filled 2020-01-03 (×3): qty 2

## 2020-01-03 NOTE — Progress Notes (Signed)
UNASSIGNED PATIENT Subjective: Events since admission noted. He is in the process of going to radiology for a lymph node biopsy. He is asleep and his wife is at his bedside.     Objective: Vital signs in last 24 hours: Temp:  [98.9 F (37.2 C)-99.6 F (37.6 C)] 98.9 F (37.2 C) (10/11 0552) Pulse Rate:  [109-120] 110 (10/11 0552) Resp:  [18-20] 20 (10/11 0552) BP: (97-120)/(56-72) 97/56 (10/11 0552) SpO2:  [93 %-96 %] 93 % (10/11 0552) Weight:  [80.7 kg] 80.7 kg (10/11 0552) Last BM Date: 01/02/20  Intake/Output from previous day: 10/10 0701 - 10/11 0700 In: -  Out: 1300 [Urine:1300] Intake/Output this shift: No intake/output data recorded.  Patient was not examined today as he was asleep  Lab Results: Recent Labs    01/01/20 1538 01/01/20 2247 01/02/20 0250  WBC 7.7 5.1 7.1  HGB 7.8* 7.2* 8.2*  HCT 26.7* 24.0* 28.1*  PLT 252 196 242   BMET Recent Labs    01/01/20 1538 01/02/20 0250  NA 134* 134*  K 3.7 3.7  CL 99 99  CO2 25 25  GLUCOSE 138* 98  BUN 14 13  CREATININE 0.88 0.76  CALCIUM 7.9* 8.1*   LFT Recent Labs    01/02/20 0250  PROT 5.7*  ALBUMIN 1.7*  AST 106*  ALT 193*  ALKPHOS 243*  BILITOT 0.9   PT/INR Recent Labs    01/01/20 1538 01/02/20 0250  LABPROT 15.3* 14.9  INR 1.3* 1.2   Studies/Results: CT Head Wo Contrast  Result Date: 01/01/2020 CLINICAL DATA:  Mental status change EXAM: CT HEAD WITHOUT CONTRAST TECHNIQUE: Contiguous axial images were obtained from the base of the skull through the vertex without intravenous contrast. COMPARISON:  MRI 12/16/2019, CT 12/15/2019 FINDINGS: Brain: No evidence of acute infarction, hemorrhage, hydrocephalus, extra-axial collection, visible mass lesion or mass effect. Basal cisterns are patent. Borderline low positioning of the right cerebellar tonsil projecting approximately 5 mm below the foramen magnum but with otherwise morphologically normal appearance of the posterior fossa. Vascular: No  hyperdense vessel or unexpected calcification. Skull: No calvarial fracture or suspicious osseous lesion. No scalp swelling or hematoma. Sinuses/Orbits: Paranasal sinuses are clear. Left mastoid effusion. Right mastoid air cells are predominantly clear. Partial pneumatization of the petrous apices. Middle ear cavities are clear. Included orbital structures are unremarkable. Other: Leftward nasal septal deviation with a contacting left-sided nasal septal spur and prominent right concha bullosa. IMPRESSION: 1. No acute intracranial findings. 2. Low positioning of the right cerebellar tonsil projecting approximately 5 mm below the foramen magnum compatible with cerebellar tonsillar ectopia, nonspecific finding which can be asymptomatic or on the spectrum of Chiari 1 malformation. 3. Left mastoid effusion. 4. Leftward nasal septal deviation with a contacting left-sided nasal septal spur and prominent right concha bullosa. Electronically Signed   By: Lovena Le M.D.   On: 01/01/2020 17:34   CT Soft Tissue Neck W Contrast  Result Date: 01/01/2020 CLINICAL DATA:  Lymphadenopathy.  Abnormal ultrasound. EXAM: CT NECK WITH CONTRAST TECHNIQUE: Multidetector CT imaging of the neck was performed using the standard protocol following the bolus administration of intravenous contrast. CONTRAST:  15mL OMNIPAQUE IOHEXOL 300 MG/ML  SOLN COMPARISON:  Soft tissue neck ultrasound 12/31/2019 FINDINGS: Pharynx and larynx: Study is mildly degraded by patient motion throughout. No focal mucosal or submucosal lesions are present. Nasopharynx is clear. Soft palate and tongue base are within normal limits. Epiglottis is normal. Hypopharynx is within normal limits. Vocal cord midline insert metric. The trachea  is normal. Salivary glands: The submandibular and parotid glands and ducts are within normal limits. Thyroid: Negative Lymph nodes: Extensive left-sided adenopathy is present. Left submandibular node or conglomeration of nodes  measures 3.6 x 1.9 x 1.7 cm. Enlarged level 2 and level 3 lymph nodes are present the largest left level 2 lymph node measures at least 2.5 x 1.8 cm. No primary lesion is identified. Vascular: No focal vascular lesions or significant atherosclerotic changes present. Limited intracranial: Within normal limits. Visualized orbits: The globes and orbits are within normal limits. Mastoids and visualized paranasal sinuses: The paranasal sinuses and mastoid air cells are clear. Skeleton: Degenerative changes are most evident C5-6 and C6-7. Straightening of the normal cervical lordosis is present. No focal lytic or blastic lesion is present. Upper chest: The lung apices are clear. The thoracic inlet is within normal limits. IMPRESSION: 1. Extensive left-sided adenopathy without a primary lesion. This is concerning for malignancy. This may represent metastatic disease of unknown primary versus lymphoma or leukemia. 2. No primary lesion is identified. 3. Degenerative changes of the cervical spine are most evident C5-6 and C6-7. Electronically Signed   By: San Morelle M.D.   On: 01/01/2020 17:42   MR BRAIN WO CONTRAST  Result Date: 01/02/2020 CLINICAL DATA:  Delirium.  Confusion.  Mental status changes. EXAM: MRI HEAD WITHOUT CONTRAST TECHNIQUE: Multiplanar, multiecho pulse sequences of the brain and surrounding structures were obtained without intravenous contrast. COMPARISON:  Head CT 01/01/2020.  MRI 12/16/2019. FINDINGS: Brain: 7 punctate foci restricted diffusion seen scattered within the cortical and subcortical brain of both hemispheres consistent with small infarctions. No large confluent area of restricted diffusion. Within both cerebral hemispheres, more extensive on the left than the right, there are areas of abnormal T2 and FLAIR signal affecting the cortical and subcortical brain. This is most notable at the left frontal vertex and in the left lateral temporal lobe. The last study was motion degraded  and abbreviated, but the FLAIR and T2 imaging is of reasonable quality in those changes do not appear to have been present at that time. There is no evidence of mass, hemorrhage, hydrocephalus or extra-axial collection. The differential diagnosis is that of sequela of ischemic disease versus is demyelinating disease versus autoimmune disease or vasculitis. No sign of hemorrhage, hydrocephalus or extra-axial collection. Small focus of hemosiderin in the left parietal cortical and subcortical brain is 1 exception to that but is of uncertain chronicity. I think one can appreciate some susceptibility effect in this location on the diffusion imaging late September. Vascular: Major vessels at the base of the brain show flow. Skull and upper cervical spine: Negative Sinuses/Orbits: Sinuses are clear.  Orbits negative. Other: Small mastoid effusions. IMPRESSION: 1. 7 punctate foci of restricted diffusion seen scattered within the cortical and subcortical brain of both hemispheres consistent with small acute infarctions. Other areas indistinct T2 and FLAIR signal affecting the cortical and subcortical brain, separate from those areas of small restricted diffusion. This process is most notable at the left frontal vertex and in the left lateral temporal lobe. The differential diagnosis is that of ischemic disease versus demyelinating disease versus autoimmune cerebritis versus infectious cerebritis versus vasculitis. Electronically Signed   By: Nelson Chimes M.D.   On: 01/02/2020 09:39   CT CHEST ABDOMEN PELVIS W CONTRAST  Result Date: 01/01/2020 CLINICAL DATA:  Lymphadenopathy in the neck. Concern for metastatic disease. EXAM: CT CHEST, ABDOMEN, AND PELVIS WITH CONTRAST TECHNIQUE: Multidetector CT imaging of the chest, abdomen and pelvis was  performed following the standard protocol during bolus administration of intravenous contrast. CONTRAST:  138mL OMNIPAQUE IOHEXOL 300 MG/ML  SOLN COMPARISON:  None. FINDINGS: CT CHEST  FINDINGS Cardiovascular: The heart size is unremarkable. There is a trace pericardial effusion. Minimal atherosclerotic changes are noted of the thoracic aorta. There is no dissection. There is no large centrally located pulmonary embolism. Detection of smaller pulmonary emboli is limited by contrast bolus timing and technique. Mediastinum/Nodes: -- No mediastinal lymphadenopathy. --there are few mildly prominent hilar lymph nodes. For example on the left there is a 9 mm hilar lymph node (axial series 3, image 27). --there is no significant axillary adenopathy on the left. Multiple pathologically enlarged axillary lymph nodes are noted on the right measuring up to approximately 2.7 x 5.6 cm. -- No supraclavicular lymphadenopathy. -- Normal thyroid gland where visualized. -  Unremarkable esophagus. Lungs/Pleura: There is a 3 mm pulmonary nodule in the right upper lobe (axial series 4, image 22). There is an additional 3 mm pulmonary nodule in the right upper lobe (axial series 4, image 35). There is a ground-glass airspace opacity in the right upper lobe measuring approximately 2.2 cm (axial series 4, image 49). There is a 5-6 mm pulmonary nodule in the right upper lobe (axial series 4, image 70). There is a 6 mm pulmonary nodule in the right middle lobe (axial series 4, image 104). There is atelectasis in the right lower lobe. Musculoskeletal: No chest wall abnormality. No bony spinal canal stenosis. Mild bilateral gynecomastia is noted. CT ABDOMEN PELVIS FINDINGS Hepatobiliary: There is hepatic steatosis. The liver is enlarged measuring approximately 19 cm craniocaudad. There is no discrete hepatic mass. Normal gallbladder.There is no biliary ductal dilation. Pancreas: Normal contours without ductal dilatation. No peripancreatic fluid collection. Spleen: The spleen is significantly enlarged measuring approximately 19.4 cm craniocaudad. Adrenals/Urinary Tract: --Adrenal glands: Unremarkable. --Right kidney/ureter:  There is a low-attenuation 2.1 cm structure in the right kidney measuring approximately 21 Hounsfield units. There is no hydronephrosis. --Left kidney/ureter: No hydronephrosis or radiopaque kidney stones. --Urinary bladder: Unremarkable. Stomach/Bowel: --Stomach/Duodenum: No hiatal hernia or other gastric abnormality. Normal duodenal course and caliber. --Small bowel: Unremarkable. --Colon: There are few scattered colonic diverticula without CT evidence for diverticulitis. --Appendix: Normal. Vascular/Lymphatic: Atherosclerotic calcification is present within the non-aneurysmal abdominal aorta, without hemodynamically significant stenosis. --No retroperitoneal lymphadenopathy. --No mesenteric lymphadenopathy. --there is a pathologically enlarged left inguinal lymph node measuring approximately 2.5 cm (axial series 3, image 124). Reproductive: The prostate gland is enlarged. Other: No ascites or free air. The abdominal wall is normal. Musculoskeletal. No acute displaced fractures. IMPRESSION: 1. Pathologically enlarged lymph nodes in the right axilla and left groin in addition to hepatosplenomegaly is concerning for a lymphoproliferative disorder such as lymphoma. The lymph nodes in the right axilla and left groin are readily amenable to percutaneous biopsy. 2. Hepatic steatosis. 3. Indeterminate 2.1 cm nodule in the right kidney. This is favored to represent a proteinaceous or hemorrhagic cyst. Follow-up with a nonemergent renal ultrasound as an outpatient is recommended for confirmation. 4. Multiple indeterminate pulmonary nodules are noted measuring up to approximately 6 mm. Follow-up non-contrast CT recommended at 3-6 months to confirm persistence. If unchanged, and solid component remains <6 mm, annual CT is recommended until 5 years of stability has been established. If persistent these nodules should be considered highly suspicious if the solid component of the nodule is 6 mm or greater in size and enlarging.  This recommendation follows the consensus statement: Guidelines for Management of Incidental Pulmonary  Nodules Detected on CT Images: From the Fleischner Society 2017; Radiology 2017; 284:228-243. 5. There is a subtle 2.2 cm ground-glass opacity in the right upper lobe as detailed above. This is favored to represent an infectious or inflammatory process. Attention on follow-up CT is recommended. Aortic Atherosclerosis (ICD10-I70.0). Electronically Signed   By: Constance Holster M.D.   On: 01/01/2020 20:51   DG Chest Portable 1 View  Result Date: 01/01/2020 CLINICAL DATA:  Cough EXAM: PORTABLE CHEST 1 VIEW COMPARISON:  December 15, 2019 FINDINGS: The cardiomediastinal silhouette is unchanged in contour.RIGHT rounded retrocardiac opacity may reflect a hiatal hernia. No pleural effusion. No pneumothorax. No acute pleuroparenchymal abnormality. Visualized abdomen is unremarkable. Multilevel degenerative changes of the thoracic spine. IMPRESSION: No acute cardiopulmonary abnormality. Electronically Signed   By: Valentino Saxon MD   On: 01/01/2020 16:32   Medications: I have reviewed the patient's current medications.  Assessment/Plan: 1) Acute metabolic encephalopathy-probably multifactorial.  2) Lymphadenopathy-head and neck-biopsy pending. 3) Elevated liver enzymes-?from malignancy; await biopsy results.   LOS: 2 days   Juanita Craver 01/03/2020, 12:32 PM

## 2020-01-03 NOTE — Progress Notes (Signed)
Inpatient Rehab Admissions Coordinator Note:   Per PT recommendations, pt was screened for CIR candidacy by Shann Medal, PT, DPT.  At this time appears pt with poor participation in PT eval; will follow for 1 more session to assess tolerance before requesting an order for CIR consult.  Please contact me with questions.   Shann Medal, PT, DPT [phone] 01/03/20 10:50 AM

## 2020-01-03 NOTE — Evaluation (Signed)
Occupational Therapy Evaluation Patient Details Name: Brian Ochoa MRN: [id number] DOB: Jul 28, 1960 Today's Date: 01/03/2020    History of Present Illness Pt is a 59 y.o. M with significant PMH of TIA and HCV who presents with acute encephalopathy, lymphadenopathy of head/neck (CT findings concerning for malignancy vs lymphoma vs leukemia vs mets). Brain MRI showing 7 punctate foci of restricted diffusion seen scattered within thecortical and subcortical brain of both hemispheres consistent with small acute infarctions.   Clinical Impression   At his baseline, pt is employed as a Development worker, community and independent. Pt presents with impaired cognition in the areas of attention, following commands, sequencing and problem solving. He demonstrates poor awareness of deficits and safety and is disoriented. Per wife, pt is able to self feed. Pt largely uncooperative/unable to participate in ADL. Pt has had many tests today and is due for a biopsy this pm. Will follow acutely.    Follow Up Recommendations  CIR;Supervision/Assistance - 24 hour    Equipment Recommendations  3 in 1 bedside commode    Recommendations for Other Services       Precautions / Restrictions Precautions Precautions: Fall      Mobility Bed Mobility Overal bed mobility: Needs Assistance Bed Mobility: Supine to Sit;Sit to Supine     Supine to sit: Mod assist Sit to supine: Min assist   General bed mobility comments: assist to initiate LEs over EOB, pt reaching for therapist's hand to pull up, but then immediately returning to supine, assist for R LE back into bed  Transfers                 General transfer comment: pt declined    Balance                                           ADL either performed or assessed with clinical judgement   ADL                                         General ADL Comments: pt able to self feed with set up per wife, otherwise dependent      Vision Baseline Vision/History: No visual deficits       Perception     Praxis      Pertinent Vitals/Pain Pain Assessment: Faces Faces Pain Scale: No hurt     Hand Dominance Right   Extremity/Trunk Assessment Upper Extremity Assessment Upper Extremity Assessment: Overall WFL for tasks assessed (non purposeful movements)   Lower Extremity Assessment Lower Extremity Assessment: Defer to PT evaluation   Cervical / Trunk Assessment Cervical / Trunk Assessment: Normal   Communication Communication Communication: Expressive difficulties (tangential and non-sensical at times)   Cognition Arousal/Alertness: Awake/alert Behavior During Therapy: Restless Overall Cognitive Status: Impaired/Different from baseline Area of Impairment: Orientation;Attention;Following commands;Safety/judgement;Awareness;Problem solving                 Orientation Level: Disoriented to;Time;Situation;Place Current Attention Level: Focused   Following Commands: Follows one step commands inconsistently Safety/Judgement: Decreased awareness of safety;Decreased awareness of deficits Awareness: Intellectual Problem Solving: Requires verbal cues;Slow processing;Decreased initiation;Difficulty sequencing;Requires tactile cues     General Comments       Exercises     Shoulder Instructions      Home Living Family/patient expects to be discharged  to:: Private residence Living Arrangements: Spouse/significant other Available Help at Discharge: Family;Available 24 hours/day Type of Home: House Home Access: Stairs to enter                         Additional Comments: per wife, pt is a Development worker, community      Prior Functioning/Environment Level of Independence: Needs assistance  Gait / Transfers Assistance Needed: independent prior to previous admission ADL's / Homemaking Assistance Needed: independent prior to previous admission            OT Problem List: Decreased  cognition;Decreased safety awareness;Decreased knowledge of use of DME or AE      OT Treatment/Interventions: Self-care/ADL training;DME and/or AE instruction;Therapeutic activities;Patient/family education;Cognitive remediation/compensation    OT Goals(Current goals can be found in the care plan section) Acute Rehab OT Goals Patient Stated Goal: for pt to return to normal (wife) OT Goal Formulation: With family Time For Goal Achievement: 01/17/20 Potential to Achieve Goals: Fair ADL Goals Pt Will Perform Grooming: with min assist;sitting Pt Will Perform Upper Body Dressing: with min assist;sitting Pt Will Perform Lower Body Dressing: with min assist;sit to/from stand Pt Will Transfer to Toilet: with min assist;ambulating Pt Will Perform Toileting - Clothing Manipulation and hygiene: with min assist;sit to/from stand Additional ADL Goal #1: Pt will participate in ADL x 3 minutes with minimal verbal cues for task attention. Additional ADL Goal #2: Pt will follow simple commands with 50% accuracy. Additional ADL Goal #3: Pt will perform bed mobility with supervision in preparation for ADL.  OT Frequency: Min 2X/week   Barriers to D/C:            Co-evaluation              AM-PAC OT "6 Clicks" Daily Activity     Outcome Measure Help from another person eating meals?: A Little Help from another person taking care of personal grooming?: A Lot Help from another person toileting, which includes using toliet, bedpan, or urinal?: Total Help from another person bathing (including washing, rinsing, drying)?: A Lot Help from another person to put on and taking off regular upper body clothing?: A Lot Help from another person to put on and taking off regular lower body clothing?: Total 6 Click Score: 11   End of Session    Activity Tolerance: Patient limited by lethargy Patient left: in bed;with call bell/phone within reach;with bed alarm set;with family/visitor present  OT Visit  Diagnosis: Other symptoms and signs involving cognitive function                Time: 9758-8325 OT Time Calculation (min): 20 min Charges:  OT General Charges $OT Visit: 1 Visit OT Evaluation $OT Eval Moderate Complexity: 1 Mod  Nestor Lewandowsky, OTR/L Acute Rehabilitation Services Pager: [phone] Office: [phone]  [name] 01/03/2020, 3:45 PM

## 2020-01-03 NOTE — Consult Note (Signed)
Chief Complaint: Patient was seen in consultation today for lymphadenopathy.  Referring Physician(s): Charlynne Cousins, MD  Supervising Physician: Aletta Edouard  Patient Status: [facility] - In-pt  History of Present Illness: Brian Ochoa is a 59 y.o. male with a past medical history significant for TIA, hepatitis C and elevated LFTs who presented to the ED on 01/01/20 with complaints of AMS. Patient was recently admitted to [facility] from 9/22 - 9/29 due to AMS with fatigue, weakness and decreased appetite x 2 months which was initially thought to be related to sepsis however no source was identified. During that admission he was found to have elevated LFTs and hepatitis C and was planned to be seen as an outpatient by ID and GI. However, per his wife's report, he never returned to baseline mentation and on 10/9 his mental status acutely worsened so she began to drive him to the ED - during the drive he became worse and EMS was called. In the ED he was found to be hypotensive, anemic and with elevated INR. Imaging showed pathologically enlarged lymph nodes in the right axilla and left groin, hepatosplenomegaly, indeterminate right kidney nodule, multiple indeterminate pulmonary nodules and subtle ground glass opacity in the RUE favoring infectious or inflammatory process. IR has been asked to evaluate the patient for possible image guided lymph node biopsy. Of note, patient previously approved for liver lesion biopsy in IR which was planned to be performed as an outpatient after follow up with GI.   Patient seen in his room, wife at bedside. He was initially sleeping but arouses when I turn the lights on - he is able to tell me his name, birthday and where he is right now. I discussed the procedure with both he and his wife and they both are agreeable to proceed.   Past Medical History:  Diagnosis Date  . TIA (transient ischemic attack)     No past surgical history on file.  Allergies: Patient  has no known allergies.  Medications: Prior to Admission medications   Medication Sig Start Date End Date Taking? Authorizing Provider  acetaminophen (TYLENOL) 325 MG tablet Take 650 mg by mouth daily.   Yes [provider]  albuterol (VENTOLIN HFA) 108 (90 Base) MCG/ACT inhaler Inhale 2 puffs into the lungs every 6 (six) hours as needed for wheezing or shortness of breath.   Yes [provider]  aspirin 325 MG tablet Take 325 mg by mouth daily.   Yes [provider]  benzonatate (TESSALON) 100 MG capsule Take 100-200 mg by mouth 3 (three) times daily as needed for cough.   Yes [provider]  Multiple Vitamin (MULTIVITAMIN) capsule Take 1 capsule by mouth daily.   Yes [provider]  pantoprazole (PROTONIX) 40 MG tablet Take 1 tablet (40 mg total) by mouth daily before breakfast. 12/22/19  Yes Amin, Jeanella Flattery, MD     No family history on file.  Social History   Socioeconomic History  . Marital status: Married    Spouse name: Not on file  . Number of children: Not on file  . Years of education: Not on file  . Highest education level: Not on file  Occupational History  . Not on file  Tobacco Use  . Smoking status: Current Every Day Smoker    Types: Cigars  . Smokeless tobacco: Current User    Types: Chew  Substance and Sexual Activity  . Alcohol use: Not Currently  . Drug use: Never  . Sexual  activity: Not on file  Other Topics Concern  . Not on file  Social History Narrative  . Not on file   Social Determinants of Health   Financial Resource Strain:   . Difficulty of Paying Living Expenses: Not on file  Food Insecurity:   . Worried About Charity fundraiser in the Last Year: Not on file  . Ran Out of Food in the Last Year: Not on file  Transportation Needs:   . Lack of Transportation (Medical): Not on file  . Lack of Transportation (Non-Medical): Not on file  Physical Activity:   . Days of Exercise per Week: Not on  file  . Minutes of Exercise per Session: Not on file  Stress:   . Feeling of Stress : Not on file  Social Connections:   . Frequency of Communication with Friends and Family: Not on file  . Frequency of Social Gatherings with Friends and Family: Not on file  . Attends Religious Services: Not on file  . Active Member of Clubs or Organizations: Not on file  . Attends Archivist Meetings: Not on file  . Marital Status: Not on file     Review of Systems: A 12 point ROS discussed and pertinent positives are indicated in the HPI above.  All other systems are negative.  Review of Systems  Constitutional: Negative for chills and fever.  Respiratory: Negative for cough and shortness of breath.   Cardiovascular: Negative for chest pain.  Gastrointestinal: Negative for abdominal pain, nausea and vomiting.  Musculoskeletal: Negative for back pain.  Neurological: Positive for headaches.    Vital Signs: BP (!) 97/56 (BP Location: Right Arm)   Pulse (!) 110   Temp 98.9 F (37.2 C) (Oral)   Resp 20   Ht 5\' 11"  (1.803 m)   Wt 178 lb (80.7 kg)   SpO2 93%   BMI 24.83 kg/m   Physical Exam Vitals and nursing note reviewed.  Constitutional:      General: He is not in acute distress. HENT:     Head: Normocephalic.     Mouth/Throat:     Mouth: Mucous membranes are moist.     Pharynx: Oropharynx is clear. No oropharyngeal exudate or posterior oropharyngeal erythema.  Cardiovascular:     Rate and Rhythm: Normal rate and regular rhythm.  Pulmonary:     Effort: Pulmonary effort is normal.  Abdominal:     General: There is no distension.     Palpations: Abdomen is soft.     Tenderness: There is no abdominal tenderness.  Lymphadenopathy:     Cervical: Cervical adenopathy present.  Skin:    General: Skin is warm and dry.  Neurological:     Mental Status: He is alert.      MD Evaluation Airway: WNL Heart: WNL Abdomen: WNL Chest/ Lungs: WNL Mallampati/Airway Score:  Two   Imaging: CT Head Wo Contrast  Result Date: 01/01/2020 CLINICAL DATA:  Mental status change EXAM: CT HEAD WITHOUT CONTRAST TECHNIQUE: Contiguous axial images were obtained from the base of the skull through the vertex without intravenous contrast. COMPARISON:  MRI 12/16/2019, CT 12/15/2019 FINDINGS: Brain: No evidence of acute infarction, hemorrhage, hydrocephalus, extra-axial collection, visible mass lesion or mass effect. Basal cisterns are patent. Borderline low positioning of the right cerebellar tonsil projecting approximately 5 mm below the foramen magnum but with otherwise morphologically normal appearance of the posterior fossa. Vascular: No hyperdense vessel or unexpected calcification. Skull: No calvarial fracture or suspicious osseous lesion. No  scalp swelling or hematoma. Sinuses/Orbits: Paranasal sinuses are clear. Left mastoid effusion. Right mastoid air cells are predominantly clear. Partial pneumatization of the petrous apices. Middle ear cavities are clear. Included orbital structures are unremarkable. Other: Leftward nasal septal deviation with a contacting left-sided nasal septal spur and prominent right concha bullosa. IMPRESSION: 1. No acute intracranial findings. 2. Low positioning of the right cerebellar tonsil projecting approximately 5 mm below the foramen magnum compatible with cerebellar tonsillar ectopia, nonspecific finding which can be asymptomatic or on the spectrum of Chiari 1 malformation. 3. Left mastoid effusion. 4. Leftward nasal septal deviation with a contacting left-sided nasal septal spur and prominent right concha bullosa. Electronically Signed   By: Lovena Le M.D.   On: 01/01/2020 17:34   CT Head Wo Contrast  Result Date: 12/15/2019 CLINICAL DATA:  Mental status change EXAM: CT HEAD WITHOUT CONTRAST TECHNIQUE: Contiguous axial images were obtained from the base of the skull through the vertex without intravenous contrast. COMPARISON:  09/09/2019 head CT.  FINDINGS: Brain: No acute infarct or intracranial hemorrhage. No mass lesion. No midline shift, ventriculomegaly or extra-axial fluid collection. Vascular: No hyperdense vessel or unexpected calcification. Skull: Negative for fracture or focal lesion. Sinuses/Orbits: Normal orbits. Clear paranasal sinuses. No mastoid effusion. Other: None. IMPRESSION: No acute intracranial process. Electronically Signed   By: Primitivo Gauze M.D.   On: 12/15/2019 14:21   CT Soft Tissue Neck W Contrast  Result Date: 01/01/2020 CLINICAL DATA:  Lymphadenopathy.  Abnormal ultrasound. EXAM: CT NECK WITH CONTRAST TECHNIQUE: Multidetector CT imaging of the neck was performed using the standard protocol following the bolus administration of intravenous contrast. CONTRAST:  20mL OMNIPAQUE IOHEXOL 300 MG/ML  SOLN COMPARISON:  Soft tissue neck ultrasound 12/31/2019 FINDINGS: Pharynx and larynx: Study is mildly degraded by patient motion throughout. No focal mucosal or submucosal lesions are present. Nasopharynx is clear. Soft palate and tongue base are within normal limits. Epiglottis is normal. Hypopharynx is within normal limits. Vocal cord midline insert metric. The trachea is normal. Salivary glands: The submandibular and parotid glands and ducts are within normal limits. Thyroid: Negative Lymph nodes: Extensive left-sided adenopathy is present. Left submandibular node or conglomeration of nodes measures 3.6 x 1.9 x 1.7 cm. Enlarged level 2 and level 3 lymph nodes are present the largest left level 2 lymph node measures at least 2.5 x 1.8 cm. No primary lesion is identified. Vascular: No focal vascular lesions or significant atherosclerotic changes present. Limited intracranial: Within normal limits. Visualized orbits: The globes and orbits are within normal limits. Mastoids and visualized paranasal sinuses: The paranasal sinuses and mastoid air cells are clear. Skeleton: Degenerative changes are most evident C5-6 and C6-7.  Straightening of the normal cervical lordosis is present. No focal lytic or blastic lesion is present. Upper chest: The lung apices are clear. The thoracic inlet is within normal limits. IMPRESSION: 1. Extensive left-sided adenopathy without a primary lesion. This is concerning for malignancy. This may represent metastatic disease of unknown primary versus lymphoma or leukemia. 2. No primary lesion is identified. 3. Degenerative changes of the cervical spine are most evident C5-6 and C6-7. Electronically Signed   By: San Morelle M.D.   On: 01/01/2020 17:42   MR BRAIN WO CONTRAST  Result Date: 01/02/2020 CLINICAL DATA:  Delirium.  Confusion.  Mental status changes. EXAM: MRI HEAD WITHOUT CONTRAST TECHNIQUE: Multiplanar, multiecho pulse sequences of the brain and surrounding structures were obtained without intravenous contrast. COMPARISON:  Head CT 01/01/2020.  MRI 12/16/2019. FINDINGS: Brain: 7  punctate foci restricted diffusion seen scattered within the cortical and subcortical brain of both hemispheres consistent with small infarctions. No large confluent area of restricted diffusion. Within both cerebral hemispheres, more extensive on the left than the right, there are areas of abnormal T2 and FLAIR signal affecting the cortical and subcortical brain. This is most notable at the left frontal vertex and in the left lateral temporal lobe. The last study was motion degraded and abbreviated, but the FLAIR and T2 imaging is of reasonable quality in those changes do not appear to have been present at that time. There is no evidence of mass, hemorrhage, hydrocephalus or extra-axial collection. The differential diagnosis is that of sequela of ischemic disease versus is demyelinating disease versus autoimmune disease or vasculitis. No sign of hemorrhage, hydrocephalus or extra-axial collection. Small focus of hemosiderin in the left parietal cortical and subcortical brain is 1 exception to that but is of  uncertain chronicity. I think one can appreciate some susceptibility effect in this location on the diffusion imaging late September. Vascular: Major vessels at the base of the brain show flow. Skull and upper cervical spine: Negative Sinuses/Orbits: Sinuses are clear.  Orbits negative. Other: Small mastoid effusions. IMPRESSION: 1. 7 punctate foci of restricted diffusion seen scattered within the cortical and subcortical brain of both hemispheres consistent with small acute infarctions. Other areas indistinct T2 and FLAIR signal affecting the cortical and subcortical brain, separate from those areas of small restricted diffusion. This process is most notable at the left frontal vertex and in the left lateral temporal lobe. The differential diagnosis is that of ischemic disease versus demyelinating disease versus autoimmune cerebritis versus infectious cerebritis versus vasculitis. Electronically Signed   By: Nelson Chimes M.D.   On: 01/02/2020 09:39   MR BRAIN WO CONTRAST  Result Date: 12/16/2019 CLINICAL DATA:  Provided history: Mental status change, unknown cause. Additional provided: Altered and afebrile, tachycardic. EXAM: MRI HEAD WITHOUT CONTRAST TECHNIQUE: Multiplanar, multiecho pulse sequences of the brain and surrounding structures were obtained without intravenous contrast. COMPARISON:  Noncontrast head CT 12/15/2019. FINDINGS: Brain: The patient was unable to tolerate the full examination. As a result, only axial and coronal diffusion-weighted sequences, an axial T1 weighted sequence and an axial T2 weighted sequence could be obtained. The sagittal T1 weighted and axial T2 weighted sequences are moderately motion degraded. Cerebral volume is normal for age. There is no acute infarct. Within described limitations, there is no evidence of an intracranial mass. No extra-axial fluid collection is identified. No midline shift. Vascular: Expected proximal arterial flow voids. Skull and upper cervical spine:  Within described limitations, no focal marrow lesion is identified. Sinuses/Orbits: Visualized orbits show no acute finding. Mild ethmoid sinus mucosal thickening. Trace left mastoid effusion. IMPRESSION: Prematurely terminated and motion degraded examination as described. The diffusion-weighted imaging is of good quality. There is no evidence of acute infarct. Mild ethmoid sinus mucosal thickening. Trace left mastoid effusion. Electronically Signed   By: Kellie Simmering DO   On: 12/16/2019 10:29   US SOFT TISSUE HEAD & NECK (NON-THYROID)  Result Date: 12/31/2019 CLINICAL DATA:  Left-sided cervical lymphadenopathy. Weakness and weight loss. EXAM: ULTRASOUND OF HEAD/NECK SOFT TISSUES TECHNIQUE: Ultrasound examination of the head and neck soft tissues was performed in the area of clinical concern. COMPARISON:  None. FINDINGS: Patient's palpable area of concern within the left neck correlates with several clustered potentially pathologically enlarged cervical lymph nodes (representative image 28). Dominant left-sided cervical lymph node measures 1.4 cm in greatest short axis  diameter with obliteration of the fatty hilum (image 30). IMPRESSION: Patient's palpable area of concern within the left neck correlates with left cervical lymphadenopathy, nonspecific and while potentially infectious or inflammatory in etiology, malignancy is not excluded on the basis of this examination. Clinical correlation is advised. Further evaluation with contrast-enhanced neck CT could be performed as indicated. These results will be called to the ordering clinician or representative by the Radiologist Assistant, and communication documented in the PACS or [employer]. Electronically Signed   By: Sandi Mariscal M.D.   On: 12/31/2019 15:11   CT CHEST ABDOMEN PELVIS W CONTRAST  Result Date: 01/01/2020 CLINICAL DATA:  Lymphadenopathy in the neck. Concern for metastatic disease. EXAM: CT CHEST, ABDOMEN, AND PELVIS WITH CONTRAST  TECHNIQUE: Multidetector CT imaging of the chest, abdomen and pelvis was performed following the standard protocol during bolus administration of intravenous contrast. CONTRAST:  141mL OMNIPAQUE IOHEXOL 300 MG/ML  SOLN COMPARISON:  None. FINDINGS: CT CHEST FINDINGS Cardiovascular: The heart size is unremarkable. There is a trace pericardial effusion. Minimal atherosclerotic changes are noted of the thoracic aorta. There is no dissection. There is no large centrally located pulmonary embolism. Detection of smaller pulmonary emboli is limited by contrast bolus timing and technique. Mediastinum/Nodes: -- No mediastinal lymphadenopathy. --there are few mildly prominent hilar lymph nodes. For example on the left there is a 9 mm hilar lymph node (axial series 3, image 27). --there is no significant axillary adenopathy on the left. Multiple pathologically enlarged axillary lymph nodes are noted on the right measuring up to approximately 2.7 x 5.6 cm. -- No supraclavicular lymphadenopathy. -- Normal thyroid gland where visualized. -  Unremarkable esophagus. Lungs/Pleura: There is a 3 mm pulmonary nodule in the right upper lobe (axial series 4, image 22). There is an additional 3 mm pulmonary nodule in the right upper lobe (axial series 4, image 35). There is a ground-glass airspace opacity in the right upper lobe measuring approximately 2.2 cm (axial series 4, image 49). There is a 5-6 mm pulmonary nodule in the right upper lobe (axial series 4, image 70). There is a 6 mm pulmonary nodule in the right middle lobe (axial series 4, image 104). There is atelectasis in the right lower lobe. Musculoskeletal: No chest wall abnormality. No bony spinal canal stenosis. Mild bilateral gynecomastia is noted. CT ABDOMEN PELVIS FINDINGS Hepatobiliary: There is hepatic steatosis. The liver is enlarged measuring approximately 19 cm craniocaudad. There is no discrete hepatic mass. Normal gallbladder.There is no biliary ductal dilation.  Pancreas: Normal contours without ductal dilatation. No peripancreatic fluid collection. Spleen: The spleen is significantly enlarged measuring approximately 19.4 cm craniocaudad. Adrenals/Urinary Tract: --Adrenal glands: Unremarkable. --Right kidney/ureter: There is a low-attenuation 2.1 cm structure in the right kidney measuring approximately 21 Hounsfield units. There is no hydronephrosis. --Left kidney/ureter: No hydronephrosis or radiopaque kidney stones. --Urinary bladder: Unremarkable. Stomach/Bowel: --Stomach/Duodenum: No hiatal hernia or other gastric abnormality. Normal duodenal course and caliber. --Small bowel: Unremarkable. --Colon: There are few scattered colonic diverticula without CT evidence for diverticulitis. --Appendix: Normal. Vascular/Lymphatic: Atherosclerotic calcification is present within the non-aneurysmal abdominal aorta, without hemodynamically significant stenosis. --No retroperitoneal lymphadenopathy. --No mesenteric lymphadenopathy. --there is a pathologically enlarged left inguinal lymph node measuring approximately 2.5 cm (axial series 3, image 124). Reproductive: The prostate gland is enlarged. Other: No ascites or free air. The abdominal wall is normal. Musculoskeletal. No acute displaced fractures. IMPRESSION: 1. Pathologically enlarged lymph nodes in the right axilla and left groin in addition to hepatosplenomegaly is concerning for  a lymphoproliferative disorder such as lymphoma. The lymph nodes in the right axilla and left groin are readily amenable to percutaneous biopsy. 2. Hepatic steatosis. 3. Indeterminate 2.1 cm nodule in the right kidney. This is favored to represent a proteinaceous or hemorrhagic cyst. Follow-up with a nonemergent renal ultrasound as an outpatient is recommended for confirmation. 4. Multiple indeterminate pulmonary nodules are noted measuring up to approximately 6 mm. Follow-up non-contrast CT recommended at 3-6 months to confirm persistence. If  unchanged, and solid component remains <6 mm, annual CT is recommended until 5 years of stability has been established. If persistent these nodules should be considered highly suspicious if the solid component of the nodule is 6 mm or greater in size and enlarging. This recommendation follows the consensus statement: Guidelines for Management of Incidental Pulmonary Nodules Detected on CT Images: From the Fleischner Society 2017; Radiology 2017; 284:228-243. 5. There is a subtle 2.2 cm ground-glass opacity in the right upper lobe as detailed above. This is favored to represent an infectious or inflammatory process. Attention on follow-up CT is recommended. Aortic Atherosclerosis (ICD10-I70.0). Electronically Signed   By: Constance Holster M.D.   On: 01/01/2020 20:51   DG Chest Portable 1 View  Result Date: 01/01/2020 CLINICAL DATA:  Cough EXAM: PORTABLE CHEST 1 VIEW COMPARISON:  December 15, 2019 FINDINGS: The cardiomediastinal silhouette is unchanged in contour.RIGHT rounded retrocardiac opacity may reflect a hiatal hernia. No pleural effusion. No pneumothorax. No acute pleuroparenchymal abnormality. Visualized abdomen is unremarkable. Multilevel degenerative changes of the thoracic spine. IMPRESSION: No acute cardiopulmonary abnormality. Electronically Signed   By: Valentino Saxon MD   On: 01/01/2020 16:32   DG Chest Port 1 View  Result Date: 12/15/2019 CLINICAL DATA:  Sepsis EXAM: PORTABLE CHEST 1 VIEW COMPARISON:  None. FINDINGS: The heart size and mediastinal contours are within normal limits. No focal airspace consolidation, pleural effusion, or pneumothorax. The visualized skeletal structures are unremarkable. IMPRESSION: No active disease. Electronically Signed   By: Davina Poke D.O.   On: 12/15/2019 14:03   US Abdomen Limited RUQ  Result Date: 12/15/2019 CLINICAL DATA:  Transaminitis EXAM: ULTRASOUND ABDOMEN LIMITED RIGHT UPPER QUADRANT COMPARISON:  None. FINDINGS: Gallbladder: No  gallstones or wall thickening visualized. No sonographic Murphy sign noted by sonographer. Common bile duct: Diameter: 4 mm Liver: The liver is enlarged measuring 18.7 cm in the midclavicular line. There is diffuse increased liver echotexture consistent with hepatic steatosis. No focal liver abnormality. Portal vein is patent on color Doppler imaging with normal direction of blood flow towards the liver. Other: Right kidney is unremarkable measuring 12.4 cm. No free fluid. IMPRESSION: 1. Hepatomegaly, with diffuse hepatic steatosis. 2. Otherwise unremarkable exam. Electronically Signed   By: Randa Ngo M.D.   On: 12/15/2019 18:25    Labs:  CBC: Recent Labs    12/30/19 1615 01/01/20 1538 01/01/20 2247 01/02/20 0250  WBC 10.0 7.7 5.1 7.1  HGB 9.7* 7.8* 7.2* 8.2*  HCT 29.3* 26.7* 24.0* 28.1*  PLT 329 252 196 242    COAGS: Recent Labs    09/09/19 1140 09/09/19 1140 12/15/19 1332 12/15/19 1332 12/16/19 0351 12/20/19 1229 01/01/20 1538 01/02/20 0250  INR 1.1   < > 1.3*   < > 1.3* 1.4* 1.3* 1.2  APTT 27  --  27  --   --   --   --  30   < > = values in this interval not displayed.    BMP: Recent Labs    12/20/19 0508 12/20/19 6073  12/21/19 0455 12/21/19 0455 12/22/19 0753 12/30/19 1615 01/01/20 1538 01/02/20 0250  NA 133*   < > 136   < > 133* 131* 134* 134*  K 3.3*   < > 4.3   < > 3.6 4.0 3.7 3.7  CL 102   < > 101   < > 98 93* 99 99  CO2 25   < > 26   < > 26 26 25 25   GLUCOSE 140*   < > 146*   < > 135* 127* 138* 98  BUN 7   < > 8   < > 6 13 14 13   CALCIUM 7.7*   < > 7.9*   < > 7.7* 8.4* 7.9* 8.1*  CREATININE 0.78   < > 0.85   < > 0.82 0.76 0.88 0.76  GFRNONAA >60   < > >60   < > >60 101 >60 >60  GFRAA >60  --  >60  --  >60 117  --   --    < > = values in this interval not displayed.    LIVER FUNCTION TESTS: Recent Labs    12/21/19 0455 12/21/19 0455 12/22/19 0753 12/30/19 1615 01/01/20 1538 01/02/20 0250  BILITOT 0.7   < > 0.7 1.3* 0.7 0.9  AST 93*   <  > 84* 132* 134* 106*  ALT 252*   < > 223* 281* 209* 193*  ALKPHOS 225*  --  222*  --  289* 243*  PROT 4.9*   < > 4.9* 5.9* 5.6* 5.7*  ALBUMIN 1.6*  --  1.6*  --  1.7* 1.7*   < > = values in this interval not displayed.    TUMOR MARKERS: No results for input(s): AFPTM, CEA, CA199, CHROMGRNA in the last 8760 hours.  Assessment and Plan:  59 y/o M with history of fatigue, weakness, weight loss x 2 months with recent acute AMS who presented to the ED on 01/01/20 with complaints of AMS. He was admitted from 9/22 - 9/29 for same without clear etiology. He has again been admitted for AMS and noted to have extensive lymphadenopathy without primary lesion concerning for possible malignancy. IR has been asked to perform a lymph node biopsy to further direct care.   Patient history and imaging have been reviewed by Dr. Kathlene Cote who approves patient for US guided right axillary lymph node biopsy - tentatively planned for today, if unable to accommodate will plan for 10/12. Discussed with patient and wife at bedside this afternoon.   Risks and benefits of right axillary lymph node biopsy was discussed with the patient and/or patient's family including, but not limited to bleeding, infection, damage to adjacent structures or low yield requiring additional tests.  All of the questions were answered and there is agreement to proceed.  Consent signed and in chart.  Thank you for this interesting consult.  I greatly enjoyed meeting Brian Ochoa and look forward to participating in their care.  A copy of this report was sent to the requesting provider on this date.  Electronically Signed: Joaquim Nam, PA-C 01/03/2020, 10:24 AM   I spent a total of 40 Minutes  in face to face in clinical consultation, greater than 50% of which was counseling/coordinating care for right axillary lymph node biopsy.

## 2020-01-03 NOTE — Progress Notes (Signed)
Pt came to MRI for second attempt at exam. He received Ativan before coming and then a dose of Haldol. He was still moving around, taking his clothes off, and yelling and grabbing at [place] when trying to get the leads off of him. Unable to complete exam.

## 2020-01-03 NOTE — Progress Notes (Signed)
Patient had fall during shift change. Patient confused and on low bed at time of fall. Patient's Son was watching patient before fall and had left the room to take a break. Son did not tell anyone he was leaving. Patient found near door of room. No injuries noted and patient denies pain. Son reminded to notify someone if he needs to leave room. On call MD notified. Safety Zone to be completed.

## 2020-01-03 NOTE — Progress Notes (Addendum)
HOSPITAL MEDICINE OVERNIGHT EVENT NOTE    Notified by nursing that patient, upon returning from MRI had fallen out of bed shortly after arriving to his room.  Patient not found to have any evidence of head trauma or injury elsewhere.  Fall precautions initiated, no further work-up indicated at this time.  I have also been notified by nursing that patient was unable to go through with MRV and MRA this evening due to patient becoming agitated and not following commands.  These will be reattempted in the morning.  Finally, nursing states that whenever patient gets out of bed he seems extremely short of breath.  Chest x-ray from admission reviewed which reveals no evidence of pneumonia.  Nursing reports that patient is not hypoxic but does seem to be tachycardic and tachypneic.  Will obtain EKG.  Upon review of chart, considering substantial anemia will obtain a repeat CBC as well to ensure patient is not developing worsening anemia which could also contribute to his symptoms.  Finally, nursing asking whether a blood gas would be indicated.  Since patient is getting blood drawn tonight anyway will get a VBG along with the remainder of his work-up and manage as appropriate.  Vernelle Emerald  MD [facility]

## 2020-01-03 NOTE — Progress Notes (Signed)
Pt came down to MRI for multiple exams. Pt moving and grabbing head coil trying to yank it from the base. AMS. Unable to perform any MRI exams with pt in this state. RN aware. Neuro aware.

## 2020-01-03 NOTE — Progress Notes (Signed)
[facility] PROGRESS NOTE    Progress Note  Brian Ochoa  WRU:[id number] DOB: 1961/03/25 DOA: 01/01/2020 PCP: Aletha Halim., PA-C     Brief Narrative:   Brian Ochoa is an 59 y.o. male past medical history significant of TIA with a recent admission for encephalopathy discharged on 12/22/2019 presents again with altered mental status during previous hospitalization during her previous hospitalization she was found to be febrile treated for infectious etiology and encephalopathy resolved.  Of admission he became more confused so she called EMS on arrival she was found to have a blood pressure of 96/60 was given a bolus the wife also noted some lymphadenopathy in his neck showed Extensive left-sided adenopathy without a primary lesion.  Assessment/Plan:   Acute metabolic encephalopathy Work-up negative for sepsis when speaking to the family they said he never quite return to his baseline cognition. MRI of the brain 01/02/2020 showed 7 mm indurated foci in both hemispheres concerning for small acute infarct, there is T2 flair signal in the left frontal and temporal lobes concerned about stimulating versus paraneoplastic syndrome. We will consult neurology, check an MRI of the brain with contrast, MRV and an EEG  Lymphadenopathy of the head and neck: Concerning for malignancy.  CT of the abdomen and pelvis there is extensive enlarged lymph nodes in the right axilla, left groin hepatosplenomegaly We will consult IR to perform a percutaneous core biopsy of the axillary or inguinal lymph nodes Consult oncology. Used to smoke he is up-to-date on his colonoscopies.  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 Ferritin of 3000 TIBC of 193 iron of 12.  B12 465  Elevated LFTs: Suspect DILI versus hematological malignancy. On previous admission it was suspected drug-induced. 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   DVT prophylaxis: lovenox Family Communication:son Status is: Inpatient  Remains inpatient appropriate because:Hemodynamically unstable   Dispo: The patient is from: Home              Anticipated d/c is to: Home              Anticipated d/c date is: 2 days              Patient currently is not medically stable to d/c.        Code Status:     Code Status Orders  (From admission, onward)         Start     Ordered   01/01/20 2012  Full code  Continuous        01/01/20 2016        Code Status History    Date Active Date Inactive Code Status Order ID Comments User Context   12/15/2019 2332 12/22/2019 2100 Full Code [id number]  [name], MD ED   Advance Care Planning Activity        IV Access:    Peripheral IV   Procedures and diagnostic studies:   CT Head Wo Contrast  Result Date: 01/01/2020 CLINICAL DATA:  Mental status change EXAM: CT HEAD WITHOUT CONTRAST TECHNIQUE: Contiguous axial images were obtained from the base of the skull through the vertex without intravenous contrast. COMPARISON:  MRI 12/16/2019, CT 12/15/2019 FINDINGS: Brain: No evidence of acute infarction, hemorrhage, hydrocephalus, extra-axial collection, visible mass lesion or mass effect. Basal cisterns are patent. Borderline low positioning of the right cerebellar tonsil projecting approximately 5 mm below the foramen magnum but with otherwise morphologically normal appearance of the posterior  fossa. Vascular: No hyperdense vessel or unexpected calcification. Skull: No calvarial fracture or suspicious osseous lesion. No scalp swelling or hematoma. Sinuses/Orbits: Paranasal sinuses are clear. Left mastoid effusion. Right mastoid air cells are predominantly clear. Partial pneumatization of the petrous apices. Middle ear cavities are clear. Included orbital structures are unremarkable. Other: Leftward nasal septal deviation with a contacting left-sided nasal septal  spur and prominent right concha bullosa. IMPRESSION: 1. No acute intracranial findings. 2. Low positioning of the right cerebellar tonsil projecting approximately 5 mm below the foramen magnum compatible with cerebellar tonsillar ectopia, nonspecific finding which can be asymptomatic or on the spectrum of Chiari 1 malformation. 3. Left mastoid effusion. 4. Leftward nasal septal deviation with a contacting left-sided nasal septal spur and prominent right concha bullosa. Electronically Signed   By: Lovena Le M.D.   On: 01/01/2020 17:34   CT Soft Tissue Neck W Contrast  Result Date: 01/01/2020 CLINICAL DATA:  Lymphadenopathy.  Abnormal ultrasound. EXAM: CT NECK WITH CONTRAST TECHNIQUE: Multidetector CT imaging of the neck was performed using the standard protocol following the bolus administration of intravenous contrast. CONTRAST:  56mL OMNIPAQUE IOHEXOL 300 MG/ML  SOLN COMPARISON:  Soft tissue neck ultrasound 12/31/2019 FINDINGS: Pharynx and larynx: Study is mildly degraded by patient motion throughout. No focal mucosal or submucosal lesions are present. Nasopharynx is clear. Soft palate and tongue base are within normal limits. Epiglottis is normal. Hypopharynx is within normal limits. Vocal cord midline insert metric. The trachea is normal. Salivary glands: The submandibular and parotid glands and ducts are within normal limits. Thyroid: Negative Lymph nodes: Extensive left-sided adenopathy is present. Left submandibular node or conglomeration of nodes measures 3.6 x 1.9 x 1.7 cm. Enlarged level 2 and level 3 lymph nodes are present the largest left level 2 lymph node measures at least 2.5 x 1.8 cm. No primary lesion is identified. Vascular: No focal vascular lesions or significant atherosclerotic changes present. Limited intracranial: Within normal limits. Visualized orbits: The globes and orbits are within normal limits. Mastoids and visualized paranasal sinuses: The paranasal sinuses and mastoid air cells  are clear. Skeleton: Degenerative changes are most evident C5-6 and C6-7. Straightening of the normal cervical lordosis is present. No focal lytic or blastic lesion is present. Upper chest: The lung apices are clear. The thoracic inlet is within normal limits. IMPRESSION: 1. Extensive left-sided adenopathy without a primary lesion. This is concerning for malignancy. This may represent metastatic disease of unknown primary versus lymphoma or leukemia. 2. No primary lesion is identified. 3. Degenerative changes of the cervical spine are most evident C5-6 and C6-7. Electronically Signed   By: San Morelle M.D.   On: 01/01/2020 17:42   MR BRAIN WO CONTRAST  Result Date: 01/02/2020 CLINICAL DATA:  Delirium.  Confusion.  Mental status changes. EXAM: MRI HEAD WITHOUT CONTRAST TECHNIQUE: Multiplanar, multiecho pulse sequences of the brain and surrounding structures were obtained without intravenous contrast. COMPARISON:  Head CT 01/01/2020.  MRI 12/16/2019. FINDINGS: Brain: 7 punctate foci restricted diffusion seen scattered within the cortical and subcortical brain of both hemispheres consistent with small infarctions. No large confluent area of restricted diffusion. Within both cerebral hemispheres, more extensive on the left than the right, there are areas of abnormal T2 and FLAIR signal affecting the cortical and subcortical brain. This is most notable at the left frontal vertex and in the left lateral temporal lobe. The last study was motion degraded and abbreviated, but the FLAIR and T2 imaging is of reasonable quality in those changes  do not appear to have been present at that time. There is no evidence of mass, hemorrhage, hydrocephalus or extra-axial collection. The differential diagnosis is that of sequela of ischemic disease versus is demyelinating disease versus autoimmune disease or vasculitis. No sign of hemorrhage, hydrocephalus or extra-axial collection. Small focus of hemosiderin in the left  parietal cortical and subcortical brain is 1 exception to that but is of uncertain chronicity. I think one can appreciate some susceptibility effect in this location on the diffusion imaging late September. Vascular: Major vessels at the base of the brain show flow. Skull and upper cervical spine: Negative Sinuses/Orbits: Sinuses are clear.  Orbits negative. Other: Small mastoid effusions. IMPRESSION: 1. 7 punctate foci of restricted diffusion seen scattered within the cortical and subcortical brain of both hemispheres consistent with small acute infarctions. Other areas indistinct T2 and FLAIR signal affecting the cortical and subcortical brain, separate from those areas of small restricted diffusion. This process is most notable at the left frontal vertex and in the left lateral temporal lobe. The differential diagnosis is that of ischemic disease versus demyelinating disease versus autoimmune cerebritis versus infectious cerebritis versus vasculitis. Electronically Signed   By: Nelson Chimes M.D.   On: 01/02/2020 09:39   CT CHEST ABDOMEN PELVIS W CONTRAST  Result Date: 01/01/2020 CLINICAL DATA:  Lymphadenopathy in the neck. Concern for metastatic disease. EXAM: CT CHEST, ABDOMEN, AND PELVIS WITH CONTRAST TECHNIQUE: Multidetector CT imaging of the chest, abdomen and pelvis was performed following the standard protocol during bolus administration of intravenous contrast. CONTRAST:  134mL OMNIPAQUE IOHEXOL 300 MG/ML  SOLN COMPARISON:  None. FINDINGS: CT CHEST FINDINGS Cardiovascular: The heart size is unremarkable. There is a trace pericardial effusion. Minimal atherosclerotic changes are noted of the thoracic aorta. There is no dissection. There is no large centrally located pulmonary embolism. Detection of smaller pulmonary emboli is limited by contrast bolus timing and technique. Mediastinum/Nodes: -- No mediastinal lymphadenopathy. --there are few mildly prominent hilar lymph nodes. For example on the left  there is a 9 mm hilar lymph node (axial series 3, image 27). --there is no significant axillary adenopathy on the left. Multiple pathologically enlarged axillary lymph nodes are noted on the right measuring up to approximately 2.7 x 5.6 cm. -- No supraclavicular lymphadenopathy. -- Normal thyroid gland where visualized. -  Unremarkable esophagus. Lungs/Pleura: There is a 3 mm pulmonary nodule in the right upper lobe (axial series 4, image 22). There is an additional 3 mm pulmonary nodule in the right upper lobe (axial series 4, image 35). There is a ground-glass airspace opacity in the right upper lobe measuring approximately 2.2 cm (axial series 4, image 49). There is a 5-6 mm pulmonary nodule in the right upper lobe (axial series 4, image 70). There is a 6 mm pulmonary nodule in the right middle lobe (axial series 4, image 104). There is atelectasis in the right lower lobe. Musculoskeletal: No chest wall abnormality. No bony spinal canal stenosis. Mild bilateral gynecomastia is noted. CT ABDOMEN PELVIS FINDINGS Hepatobiliary: There is hepatic steatosis. The liver is enlarged measuring approximately 19 cm craniocaudad. There is no discrete hepatic mass. Normal gallbladder.There is no biliary ductal dilation. Pancreas: Normal contours without ductal dilatation. No peripancreatic fluid collection. Spleen: The spleen is significantly enlarged measuring approximately 19.4 cm craniocaudad. Adrenals/Urinary Tract: --Adrenal glands: Unremarkable. --Right kidney/ureter: There is a low-attenuation 2.1 cm structure in the right kidney measuring approximately 21 Hounsfield units. There is no hydronephrosis. --Left kidney/ureter: No hydronephrosis or radiopaque kidney stones. --  Urinary bladder: Unremarkable. Stomach/Bowel: --Stomach/Duodenum: No hiatal hernia or other gastric abnormality. Normal duodenal course and caliber. --Small bowel: Unremarkable. --Colon: There are few scattered colonic diverticula without CT evidence  for diverticulitis. --Appendix: Normal. Vascular/Lymphatic: Atherosclerotic calcification is present within the non-aneurysmal abdominal aorta, without hemodynamically significant stenosis. --No retroperitoneal lymphadenopathy. --No mesenteric lymphadenopathy. --there is a pathologically enlarged left inguinal lymph node measuring approximately 2.5 cm (axial series 3, image 124). Reproductive: The prostate gland is enlarged. Other: No ascites or free air. The abdominal wall is normal. Musculoskeletal. No acute displaced fractures. IMPRESSION: 1. Pathologically enlarged lymph nodes in the right axilla and left groin in addition to hepatosplenomegaly is concerning for a lymphoproliferative disorder such as lymphoma. The lymph nodes in the right axilla and left groin are readily amenable to percutaneous biopsy. 2. Hepatic steatosis. 3. Indeterminate 2.1 cm nodule in the right kidney. This is favored to represent a proteinaceous or hemorrhagic cyst. Follow-up with a nonemergent renal ultrasound as an outpatient is recommended for confirmation. 4. Multiple indeterminate pulmonary nodules are noted measuring up to approximately 6 mm. Follow-up non-contrast CT recommended at 3-6 months to confirm persistence. If unchanged, and solid component remains <6 mm, annual CT is recommended until 5 years of stability has been established. If persistent these nodules should be considered highly suspicious if the solid component of the nodule is 6 mm or greater in size and enlarging. This recommendation follows the consensus statement: Guidelines for Management of Incidental Pulmonary Nodules Detected on CT Images: From the Fleischner Society 2017; Radiology 2017; 284:228-243. 5. There is a subtle 2.2 cm ground-glass opacity in the right upper lobe as detailed above. This is favored to represent an infectious or inflammatory process. Attention on follow-up CT is recommended. Aortic Atherosclerosis (ICD10-I70.0). Electronically Signed    By: Constance Holster M.D.   On: 01/01/2020 20:51   DG Chest Portable 1 View  Result Date: 01/01/2020 CLINICAL DATA:  Cough EXAM: PORTABLE CHEST 1 VIEW COMPARISON:  December 15, 2019 FINDINGS: The cardiomediastinal silhouette is unchanged in contour.RIGHT rounded retrocardiac opacity may reflect a hiatal hernia. No pleural effusion. No pneumothorax. No acute pleuroparenchymal abnormality. Visualized abdomen is unremarkable. Multilevel degenerative changes of the thoracic spine. IMPRESSION: No acute cardiopulmonary abnormality. Electronically Signed   By: Valentino Saxon MD   On: 01/01/2020 16:32     Medical Consultants:    None.  Anti-Infectives:  None  Subjective:    Maryella Shivers no new complaints, the family relates there is no change in mentation and this is not his baseline.  Objective:    Vitals:   01/02/20 0727 01/02/20 1552 01/02/20 2209 01/03/20 0552  BP: 115/68 120/70 114/72 (!) 97/56  Pulse: (!) 115 (!) 120 (!) 109 (!) 110  Resp: 20 18 20 20   Temp: 99.5 F (37.5 C) 99.6 F (37.6 C) 99.2 F (37.3 C) 98.9 F (37.2 C)  TempSrc: Oral Oral Oral Oral  SpO2: 96% 96% 94% 93%  Weight:    80.7 kg  Height:       SpO2: 93 %   Intake/Output Summary (Last 24 hours) at 01/03/2020 0853 Last data filed at 01/03/2020 0751 Gross per 24 hour  Intake 0 ml  Output 1300 ml  Net -1300 ml   Filed Weights   01/01/20 2225 01/02/20 0400 01/03/20 0552  Weight: 80.5 kg 80.7 kg 80.7 kg    Exam: General exam: In no acute distress. Respiratory system: Good air movement and clear to auscultation. Cardiovascular system: S1 & S2 heard, RRR.  No JVD. Gastrointestinal system: Abdomen is nondistended, soft and nontender.  Central nervous system: Alert and oriented. No focal neurological deficits. Extremities: No pedal edema. Skin: No rashes, lesions or ulcers Psychiatry: Judgment and insight of medical condition mood and affect are not appropriate.   Data Reviewed:     Labs: Basic Metabolic Panel: Recent Labs  Lab 12/30/19 1615 12/30/19 1615 01/01/20 1538 01/02/20 0250  NA 131*  --  134* 134*  K 4.0   < > 3.7 3.7  CL 93*  --  99 99  CO2 26  --  25 25  GLUCOSE 127*  --  138* 98  BUN 13  --  14 13  CREATININE 0.76  --  0.88 0.76  CALCIUM 8.4*  --  7.9* 8.1*   < > = values in this interval not displayed.   GFR Estimated Creatinine Clearance: 108.5 mL/min (by C-G formula based on SCr of 0.76 mg/dL). Liver Function Tests: Recent Labs  Lab 12/30/19 1615 01/01/20 1538 01/02/20 0250  AST 132* 134* 106*  ALT 281* 209* 193*  ALKPHOS  --  289* 243*  BILITOT 1.3* 0.7 0.9  PROT 5.9* 5.6* 5.7*  ALBUMIN  --  1.7* 1.7*   No results for input(s): LIPASE, AMYLASE in the last 168 hours. Recent Labs  Lab 01/01/20 1540  AMMONIA 29   Coagulation profile Recent Labs  Lab 01/01/20 1538 01/02/20 0250  INR 1.3* 1.2   COVID-19 Labs  Recent Labs    01/01/20 2247  FERRITIN 3,097*  LDH 282*    Lab Results  Component Value Date   SARSCOV2NAA NEGATIVE 01/01/2020   [place] NEGATIVE 12/15/2019    CBC: Recent Labs  Lab 12/30/19 1615 01/01/20 1538 01/01/20 2247 01/02/20 0250  WBC 10.0 7.7 5.1 7.1  NEUTROABS 8,010* 6.1  --   --   HGB 9.7* 7.8* 7.2* 8.2*  HCT 29.3* 26.7* 24.0* 28.1*  MCV 77.3* 85.9 84.8 84.4  PLT 329 252 196 242   Cardiac Enzymes: No results for input(s): CKTOTAL, CKMB, CKMBINDEX, TROPONINI in the last 168 hours. BNP (last 3 results) No results for input(s): PROBNP in the last 8760 hours. CBG: Recent Labs  Lab 01/01/20 1516  GLUCAP 148*   D-Dimer: No results for input(s): DDIMER in the last 72 hours. Hgb A1c: No results for input(s): HGBA1C in the last 72 hours. Lipid Profile: No results for input(s): CHOL, HDL, LDLCALC, TRIG, CHOLHDL, LDLDIRECT in the last 72 hours. Thyroid function studies: Recent Labs    01/02/20 0653  TSH 0.351   Anemia work up: Recent Labs    01/01/20 2247  VITAMINB12  465  FOLATE 6.2  FERRITIN 3,097*  TIBC 193*  IRON 12*   Sepsis Labs: Recent Labs  Lab 12/30/19 1615 01/01/20 1538 01/01/20 1541 01/01/20 2247 01/02/20 0250  WBC 10.0 7.7  --  5.1 7.1  LATICACIDVEN  --   --  1.5 1.0  --    Microbiology Recent Results (from the past 240 hour(s))  Culture, blood (routine x 2)     Status: None (Preliminary result)   Collection Time: 01/01/20  3:25 PM   Specimen: BLOOD RIGHT FOREARM  Result Value Ref Range Status   Specimen Description BLOOD RIGHT FOREARM  Final   Special Requests NONE  Final   Culture   Final    NO GROWTH 2 DAYS Performed at [facility] [address] [place], [place] [address]    Report Status PENDING  Incomplete  Urine culture  Status: None   Collection Time: 01/01/20  3:25 PM   Specimen: Urine, Random  Result Value Ref Range Status   Specimen Description URINE, RANDOM  Final   Special Requests NONE  Final   Culture   Final    NO GROWTH Performed at [facility], [address] [place], [place] [address]    Report Status 01/02/2020 FINAL  Final  Respiratory Panel by RT PCR (Flu A&B, Covid) - Nasopharyngeal Swab     Status: None   Collection Time: 01/01/20  3:38 PM   Specimen: Nasopharyngeal Swab  Result Value Ref Range Status   SARS Coronavirus 2 by RT PCR NEGATIVE NEGATIVE Final    Comment: (NOTE) SARS-CoV-2 target nucleic acids are NOT DETECTED.  The SARS-CoV-2 RNA is generally detectable in upper respiratoy specimens during the acute phase of infection. The lowest concentration of SARS-CoV-2 viral copies this assay can detect is 131 copies/mL. A negative result does not preclude SARS-Cov-2 infection and should not be used as the sole basis for treatment or other patient management decisions. A negative result may occur with  improper specimen collection/handling, submission of specimen other than nasopharyngeal swab, presence of viral mutation(s) within the areas targeted by this assay, and  inadequate number of viral copies (<131 copies/mL). A negative result must be combined with clinical observations, patient history, and epidemiological information. The expected result is Negative.  Fact Sheet for Patients:  PinkCheek.be  Fact Sheet for Healthcare Providers:  GravelBags.it  This test is no t yet approved or cleared by the [place] FDA and  has been authorized for detection and/or diagnosis of SARS-CoV-2 by FDA under an Emergency Use Authorization (EUA). This EUA will remain  in effect (meaning this test can be used) for the duration of the COVID-19 declaration under Section 564(b)(1) of the Act, 21 U.S.C. section 360bbb-3(b)(1), unless the authorization is terminated or revoked sooner.     Influenza A by PCR NEGATIVE NEGATIVE Final   Influenza B by PCR NEGATIVE NEGATIVE Final    Comment: (NOTE) The Xpert Xpress SARS-CoV-2/FLU/RSV assay is intended as an aid in  the diagnosis of influenza from Nasopharyngeal swab specimens and  should not be used as a sole basis for treatment. Nasal washings and  aspirates are unacceptable for Xpert Xpress SARS-CoV-2/FLU/RSV  testing.  Fact Sheet for Patients: PinkCheek.be  Fact Sheet for Healthcare Providers: GravelBags.it  This test is not yet approved or cleared by the [place] FDA and  has been authorized for detection and/or diagnosis of SARS-CoV-2 by  FDA under an Emergency Use Authorization (EUA). This EUA will remain  in effect (meaning this test can be used) for the duration of the  Covid-19 declaration under Section 564(b)(1) of the Act, 21  U.S.C. section 360bbb-3(b)(1), unless the authorization is  terminated or revoked. Performed at [facility], [address], [place], [place] [address]   Culture, blood (routine x 2)     Status: None (Preliminary result)   Collection Time:  01/01/20  4:34 PM   Specimen: BLOOD LEFT HAND  Result Value Ref Range Status   Specimen Description BLOOD LEFT HAND  Final   Special Requests   Final    BOTTLES DRAWN AEROBIC AND ANAEROBIC Blood Culture adequate volume   Culture   Final    NO GROWTH 2 DAYS Performed at [facility], [place] [address] [place], [place] [address]    Report Status PENDING  Incomplete     Medications:   .  enoxaparin (LOVENOX) injection  40 mg Subcutaneous Q24H  . multivitamin with minerals  1 tablet Oral Daily  . pantoprazole  40 mg Oral QAC breakfast  . sodium chloride flush  3 mL Intravenous Q12H   Continuous Infusions: . sodium chloride        LOS: 2 days   Charlynne Cousins  [facility]  01/03/2020, 8:53 AM

## 2020-01-03 NOTE — Progress Notes (Signed)
EEG Completed; Results Pending  

## 2020-01-03 NOTE — Procedures (Signed)
Patient Name: Brian Ochoa  MRN: [id number]  Epilepsy Attending: Lora Havens  Referring Physician/Provider: Dr. Charlynne Cousins Date: 01/03/2020 Duration: 26.20 mins  Patient history: 59 year old male with worsening encephalopathy which has progressed over the last 6 to 7 weeks.  EEG to evaluate for seizures.  Level of alertness: Awake, asleep  AEDs during EEG study: None  Technical aspects: This EEG study was done with scalp electrodes positioned according to the 10-20 International system of electrode placement. Electrical activity was acquired at a sampling rate of 500Hz  and reviewed with a high frequency filter of 70Hz  and a low frequency filter of 1Hz . EEG data were recorded continuously and digitally stored.   Description: No clear posterior dominant rhythm was seen.  EEG showed continued generalized polymorphic 3 to 6 Hz theta-delta slowing.  Hyperventilation and photic stimulation were not performed.     ABNORMALITY -Continuous slow, generalized  IMPRESSION: This study is suggestive of moderate diffuse encephalopathy, nonspecific to etiology.  No seizures or epileptiform discharges were seen throughout the recording.  Brian Ochoa Barbra Sarks

## 2020-01-03 NOTE — Progress Notes (Signed)
IR.  Unable to accommodate procedure in IR today- plan for image-guided right axillary lymph node biopsy in IR tentatively for tomorrow 01/04/2020 pending IR scheduling. Patient will be NPO at midnight- RN aware.  Please call IR with questions/concerns.   Bea Graff Trystyn Sitts, PA-C 01/03/2020, 3:52 PM

## 2020-01-03 NOTE — Consult Note (Addendum)
Neurology Consultation  Reason for Consult: Encephalopathy Referring Physician: Aileen Fass  CC: Quick onset confusion  History is obtained from: Wife  HPI: Brian Ochoa is a 59 y.o. male with history of stroke, recently diagnosed lymphadenopathy concerning for possibly lymphoma. Per wife she had just been in the hospital at that time for sepsis which also had altered mental status. By the time he was discharged on 9/29 his mental status had resolved and he was back to his baseline. Patient was readmitted on 01/02/2020 for confusion and altered mental status. Wife states that he had been treated for pneumonia and sepsis approximately 8 weeks ago. Over the past 6 to 7 weeks he became more and more confused to the point where wife brought him to [facility] to be evaluated. 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 He was to have a biopsy however he is in the hospital at this time and the biopsy has been held off. Currently patient is in the room, extremely confused and apparently he was very agitated and required Haldol this morning.   During his hospitalization patient obtained an MRI which did show 7 punctate foci of restricted diffusion. There is also other areas indistinct T2 and flair signal affecting the cortical and subcortical brain, separate from those areas of restricted diffusion. For this reason neurology was consulted.   LKW: Unknown tpa given?: no, no known last seen normal Premorbid modified Rankin scale (mRS): 0  NIHSS 1a Level of Conscious.: 0 1b LOC Questions: 2 1c LOC Commands: 0 2 Best Gaze: 0 3 Visual: 0 4 Facial Palsy: 0 5a Motor Arm - left: 0 5b Motor Arm - Right:0 6a Motor Leg - Left: 0 6b Motor Leg - Right: 0 7 Limb Ataxia: 0 8 Sensory: 0 9 Best Language: 0 10 Dysarthria: 0 11 Extinct. and Inatten.: 0 TOTAL: 2   Past Medical History:  Diagnosis Date   TIA (transient ischemic attack)      Family History  Problem Relation Age  of Onset   Hypertension Mother    Hypertension Father    Social History:   reports that he has been smoking cigars. His smokeless tobacco use includes chew. He reports previous alcohol use. He reports that he does not use drugs.  Medications  Current Facility-Administered Medications:    0.9 %  sodium chloride infusion, 250 mL, Intravenous, PRN, Marcelyn Bruins, MD   acetaminophen (TYLENOL) tablet 650 mg, 650 mg, Oral, Q6H PRN **OR** acetaminophen (TYLENOL) suppository 650 mg, 650 mg, Rectal, Q6H PRN, Marcelyn Bruins, MD   albuterol (PROVENTIL) (2.5 MG/3ML) 0.083% nebulizer solution 3 mL, 3 mL, Inhalation, Q6H PRN, Marcelyn Bruins, MD   haloperidol lactate (HALDOL) injection 2 mg, 2 mg, Intravenous, Q6H PRN, Charlynne Cousins, MD, 2 mg at 01/03/20 0858   LORazepam (ATIVAN) injection 1 mg, 1 mg, Intravenous, Q4H PRN, Charlynne Cousins, MD   multivitamin with minerals tablet 1 tablet, 1 tablet, Oral, Daily, Marcelyn Bruins, MD, 1 tablet at 01/03/20 0836   pantoprazole (PROTONIX) EC tablet 40 mg, 40 mg, Oral, QAC breakfast, Marcelyn Bruins, MD, 40 mg at 01/03/20 0835   sodium chloride flush (NS) 0.9 % injection 3 mL, 3 mL, Intravenous, Q12H, Marcelyn Bruins, MD, 3 mL at 01/03/20 0835   sodium chloride flush (NS) 0.9 % injection 3 mL, 3 mL, Intravenous, PRN, Marcelyn Bruins, MD  ROS:  Unable to obtain due to altered mental status.   Exam: Current vital  signs: BP (!) 97/56 (BP Location: Right Arm)   Pulse (!) 110   Temp 98.9 F (37.2 C) (Oral)   Resp 20   Ht 5\' 11"  (1.803 m)   Wt 80.7 kg   SpO2 93%   BMI 24.83 kg/m  Vital signs in last 24 hours: Temp:  [98.9 F (37.2 C)-99.6 F (37.6 C)] 98.9 F (37.2 C) (10/11 0552) Pulse Rate:  [109-120] 110 (10/11 0552) Resp:  [18-20] 20 (10/11 0552) BP: (97-120)/(56-72) 97/56 (10/11 0552) SpO2:  [93 %-96 %] 93 % (10/11 0552) Weight:  [80.7 kg] 80.7 kg (10/11 0552)   Constitutional: Appears well-developed  and well-nourished.  Psych: confused Eyes: No scleral injection HENT: No OP obstrucion Head: Normocephalic.  Cardiovascular: Normal rate and regular rhythm.  Respiratory: Effort normal, non-labored breathing GI: Soft.  No distension. There is no tenderness.  Skin: WDI  Neuro: Mental Status: Patient is alert but not oriented, does not show any aphasia or dysarthria.  Able to follow commands which are noncomplex. Cranial Nerves: II: Visual Fields are full.  III,IV, VI: EOMI without ptosis or diploplia. Pupils equal, round and reactive to light V: Facial sensation is symmetric to temperature VII: Facial movement is symmetric.  VIII: hearing is intact to voice X: Palat elevates symmetrically XI: Shoulder shrug is symmetric. XII: tongue is midline without atrophy or fasciculations.  Motor: Tone is normal. Bulk is normal. 5/5 strength was present in all four extremities.  Sensory: Sensation is symmetric to light touch and temperature in the arms and legs. Deep Tendon Reflexes: 2+ and symmetric in the biceps and patellae.  Plantars: Toes are downgoing bilaterally.  Cerebellar: FNF intact bilaterally   Labs I have reviewed labs in epic and the results pertinent to this consultation are:   CBC    Component Value Date/Time   WBC 7.1 01/02/2020 0250   RBC 3.33 (L) 01/02/2020 0250   HGB 8.2 (L) 01/02/2020 0250   HCT 28.1 (L) 01/02/2020 0250   PLT 242 01/02/2020 0250   MCV 84.4 01/02/2020 0250   MCH 24.6 (L) 01/02/2020 0250   MCHC 29.2 (L) 01/02/2020 0250   RDW 16.8 (H) 01/02/2020 0250   LYMPHSABS 0.6 (L) 01/01/2020 1538   MONOABS 0.9 01/01/2020 1538   EOSABS 0.0 01/01/2020 1538   BASOSABS 0.0 01/01/2020 1538    CMP     Component Value Date/Time   NA 134 (L) 01/02/2020 0250   K 3.7 01/02/2020 0250   CL 99 01/02/2020 0250   CO2 25 01/02/2020 0250   GLUCOSE 98 01/02/2020 0250   BUN 13 01/02/2020 0250   CREATININE 0.76 01/02/2020 0250   CREATININE 0.76 12/30/2019  1615   CALCIUM 8.1 (L) 01/02/2020 0250   PROT 5.7 (L) 01/02/2020 0250   ALBUMIN 1.7 (L) 01/02/2020 0250   AST 106 (H) 01/02/2020 0250   ALT 193 (H) 01/02/2020 0250   ALKPHOS 243 (H) 01/02/2020 0250   BILITOT 0.9 01/02/2020 0250   GFRNONAA >60 01/02/2020 0250   GFRNONAA 101 12/30/2019 1615   GFRAA 117 12/30/2019 1615    Lipid Panel  No results found for: CHOL, TRIG, HDL, CHOLHDL, VLDL, LDLCALC, LDLDIRECT   Imaging I have reviewed the images obtained:  CT-scan of the brain-no acute intracranial findings.  Low positioning of the right cerebellar tonsil projecting approximately 5 mm below the foramen magnum compatible with Chiari I malformation.  MRI examination of the brain-7 punctate foci of restricted diffusion seen scattered within the cortical and subcortical brain of both hemispheres  consistent with small acute infarctions. Other areas indistinct T2 and FLAIR signal affecting the cortical and subcortical brain, separate from those areas of small restricted diffusion. This process is most notable at the left frontal vertex and in the left lateral temporal lobe. The differential diagnosis is that of ischemic disease versus demyelinating disease versus autoimmune cerebritis versus infectious cerebritis versus vasculitis.  Etta Quill PA-C [facility] [phone]  M-F  (9:00 am- 5:00 PM)  01/03/2020, 11:32 AM     Assessment:  This is a 59 year old male presenting to the hospital with subacute decline in mentation over 6 to 7 weeks. Wife attributes this to doxycycline but I doubt that is the case. He also has new lymphadenopathy in R axilla, left groin with suspicion for a lymphoma and transaminitis suggestive of hepatic dysfunction. Brain imaging with an MRI Brain without contrast demonstrated punctate infarcts and additional T2/FLAIR hyperintensities, more proimnent in L hemisphere but also in the R hemisphere. He also has signficant alcohol intake history and only quit  about 8 weeks ago.  Given his presentation and history with a subacute decline and MRI findings, I worry about a potential paraneoplastic vs inflammatory process vs vasculitis vs CNS lymphoma which is much more common with NHL.  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  Recommendations: -MRI of brain with contrast -MRV of brain -EEG -MRA Head and neck -Transthoracic Echo  -Start patient on ASA 325mg  daily  -Start or continue Atorvastatin 80 mg/other high intensity statin -BP goal: permissive HTN upto  140/90 -HBAIC and Lipid profile -Telemetry monitoring -Frequent neuro checks -NPO until passes stroke swallow screen -PT/OT - Recommend LP with CSF cell count, differential, protein, glucose, CSF cultures, CSF Lyme, CSF Cytology. Paraneoplastic panel on serum and CSF. - I ordered thiamine 100mg  daily given hx of EtOH use. - Serum Ammonia, TSH, RPR.(Vit B12 is low normal, folate is above 5.) # please page stroke NP  Or  PA  Or MD from 8am -4 pm  as this patient from this time will be  followed by the stroke.   You can look them up on [email or web address]  Password [email or web address]

## 2020-01-03 NOTE — Telephone Encounter (Signed)
Attempted to speak with Brian Ochoa regarding his ultrasound results and was unable to get in touch with him. Left a generic voicemail with return number. Given non-specific findings will pursue CT scan.   Terri Piedra, NP 01/03/2020 9:39 AM

## 2020-01-04 ENCOUNTER — Inpatient Hospital Stay (HOSPITAL_COMMUNITY): Payer: Worker's Compensation

## 2020-01-04 ENCOUNTER — Encounter (HOSPITAL_COMMUNITY): Payer: Self-pay | Admitting: Internal Medicine

## 2020-01-04 ENCOUNTER — Other Ambulatory Visit: Payer: Self-pay

## 2020-01-04 LAB — CBC WITH DIFFERENTIAL/PLATELET
Abs Immature Granulocytes: 0.06 10*3/uL (ref 0.00–0.07)
Basophils Absolute: 0 10*3/uL (ref 0.0–0.1)
Basophils Relative: 0 %
Eosinophils Absolute: 0 10*3/uL (ref 0.0–0.5)
Eosinophils Relative: 0 %
HCT: 23.6 % — ABNORMAL LOW (ref 39.0–52.0)
Hemoglobin: 7 g/dL — ABNORMAL LOW (ref 13.0–17.0)
Immature Granulocytes: 1 %
Lymphocytes Relative: 7 %
Lymphs Abs: 0.5 10*3/uL — ABNORMAL LOW (ref 0.7–4.0)
MCH: 24.6 pg — ABNORMAL LOW (ref 26.0–34.0)
MCHC: 29.7 g/dL — ABNORMAL LOW (ref 30.0–36.0)
MCV: 83.1 fL (ref 80.0–100.0)
Monocytes Absolute: 0.9 10*3/uL (ref 0.1–1.0)
Monocytes Relative: 12 %
Neutro Abs: 5.7 10*3/uL (ref 1.7–7.7)
Neutrophils Relative %: 80 %
Platelets: 219 10*3/uL (ref 150–400)
RBC: 2.84 MIL/uL — ABNORMAL LOW (ref 4.22–5.81)
RDW: 17.4 % — ABNORMAL HIGH (ref 11.5–15.5)
WBC: 7.2 10*3/uL (ref 4.0–10.5)
nRBC: 0 % (ref 0.0–0.2)

## 2020-01-04 LAB — PATHOLOGIST SMEAR REVIEW: Path Review: 10112021

## 2020-01-04 LAB — GLUCOSE, CAPILLARY: Glucose-Capillary: 96 mg/dL (ref 70–99)

## 2020-01-04 IMAGING — DX DG CHEST 1V
2 series · 2 of 2 positions shown · non-contrast
Comparison: Portable chest and chest CT [DATE] and earlier.

CLINICAL DATA: 58-year-old male with altered mental status,
respiratory distress.

EXAM:
CHEST  1 VIEW

[chest ap (1 of 2)]
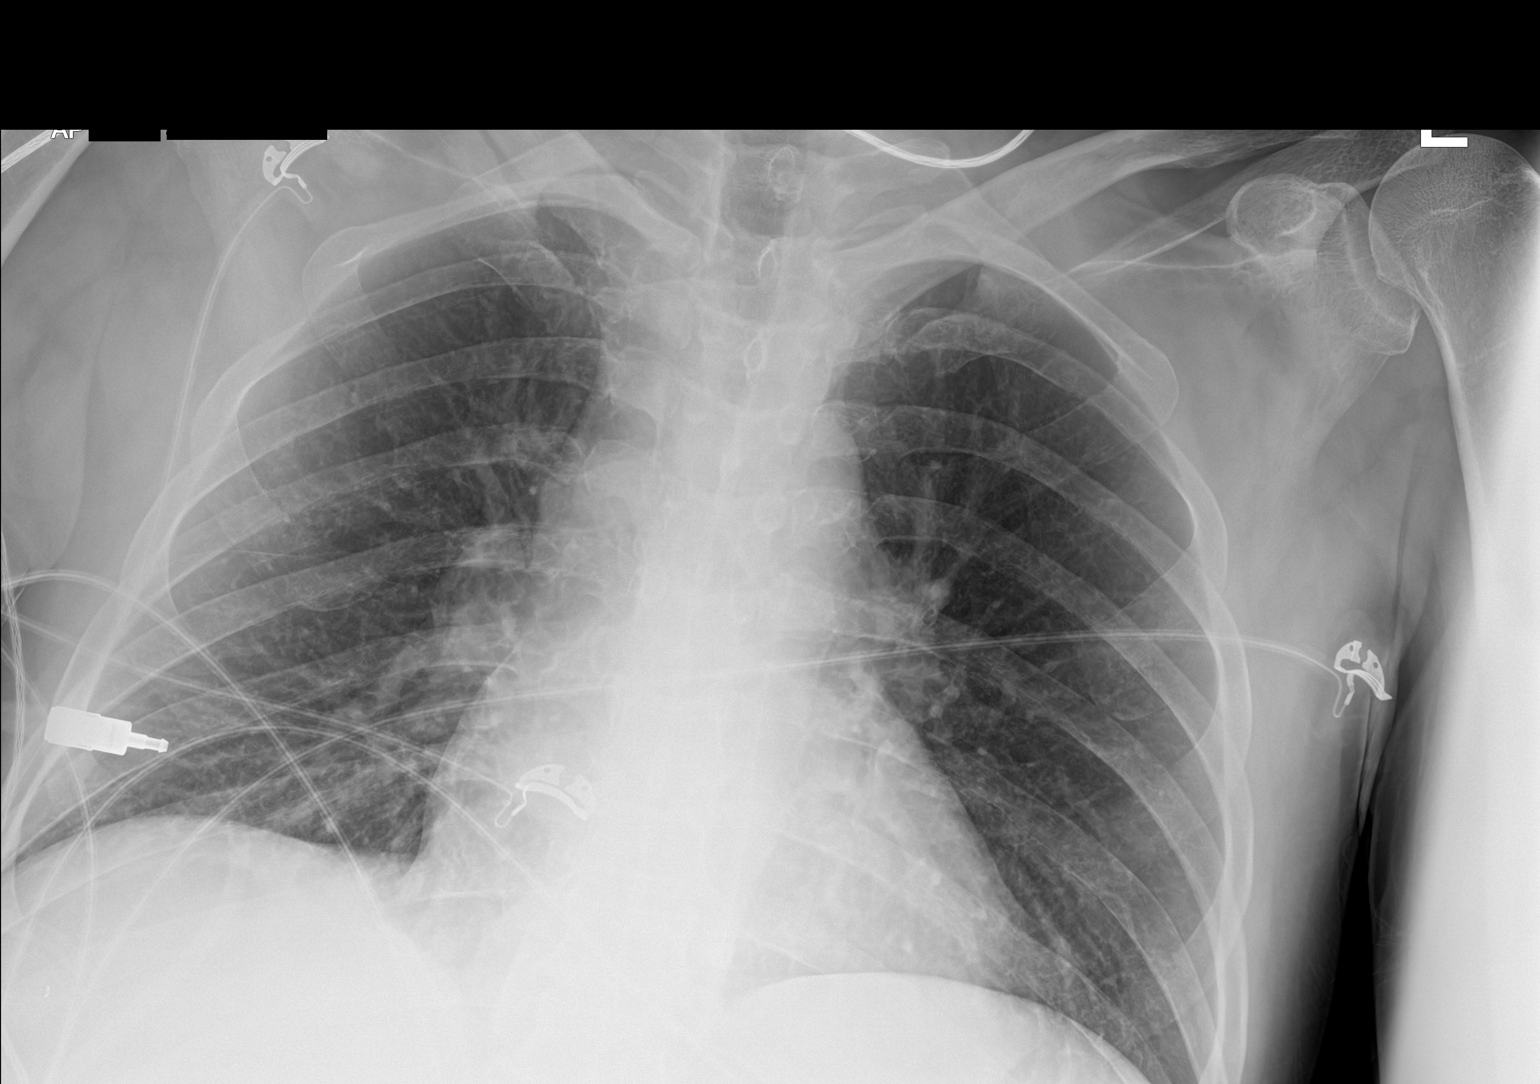

[chest ap (2 of 2)]
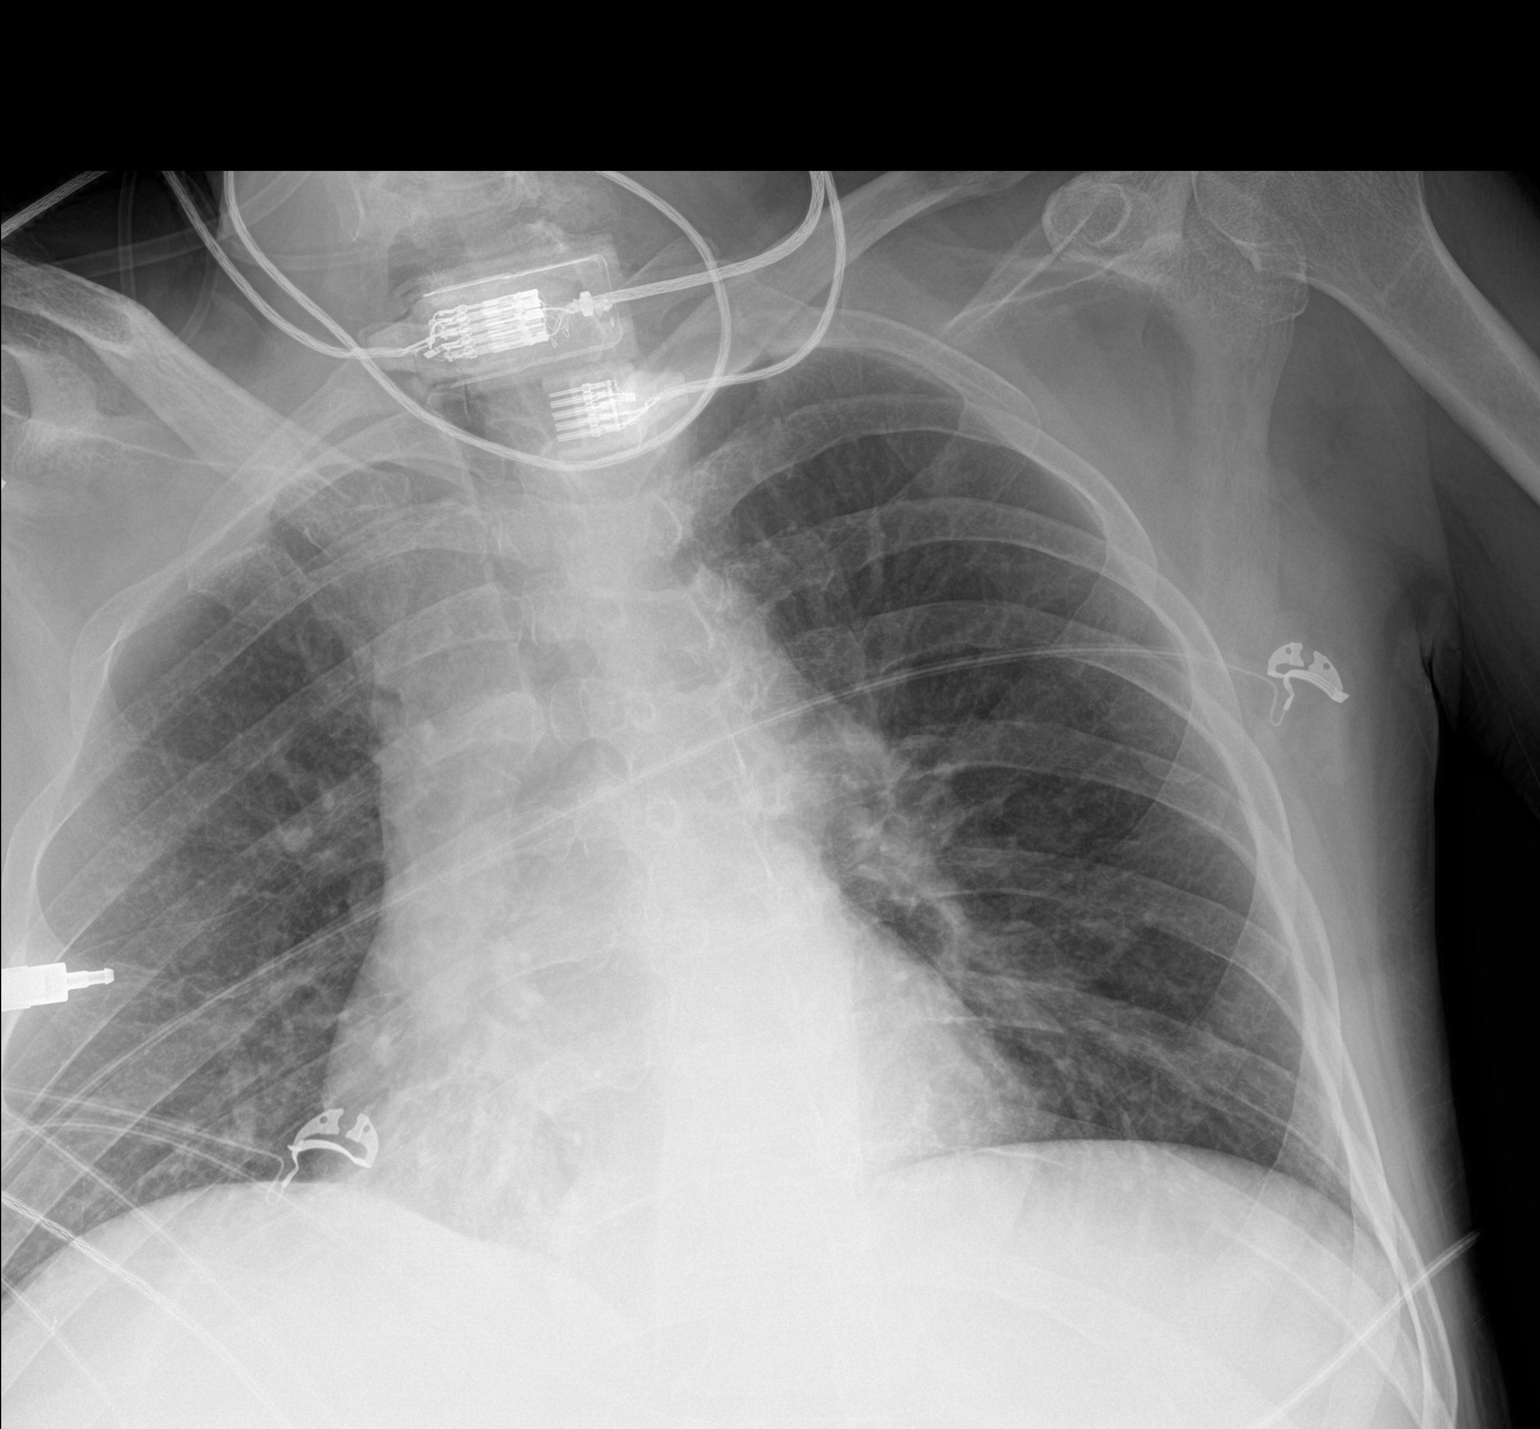

[2 of 2 positions shown; findings below may reference images not displayed]

FINDINGS: Portable AP semi upright view at [I1] hours. Stable lung volumes and
mediastinal contours. Visualized tracheal air column is within
normal limits. Allowing for portable technique the lungs are clear.
No pneumothorax or pleural effusion. No acute osseous abnormality
identified.
IMPRESSION: Negative portable chest.

## 2020-01-04 IMAGING — US US BIOPSY LYMPH NODE
1 series · 13 of 17 positions shown · non-contrast
Comparison: none

INDICATION: 58-year-old male with lymphadenopathy, concern for lymphoma.

[Series 1: us core biopsy (lymph nodes) · 13 of 17 slices shown]
[im 1/17]
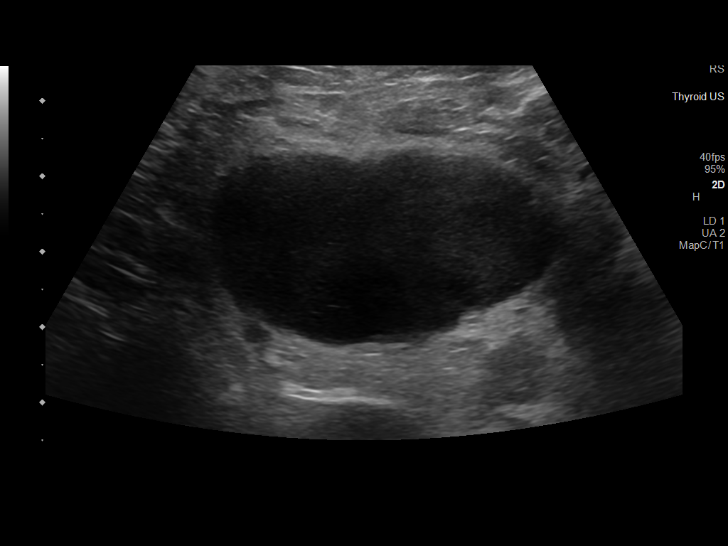
[im 2/17]
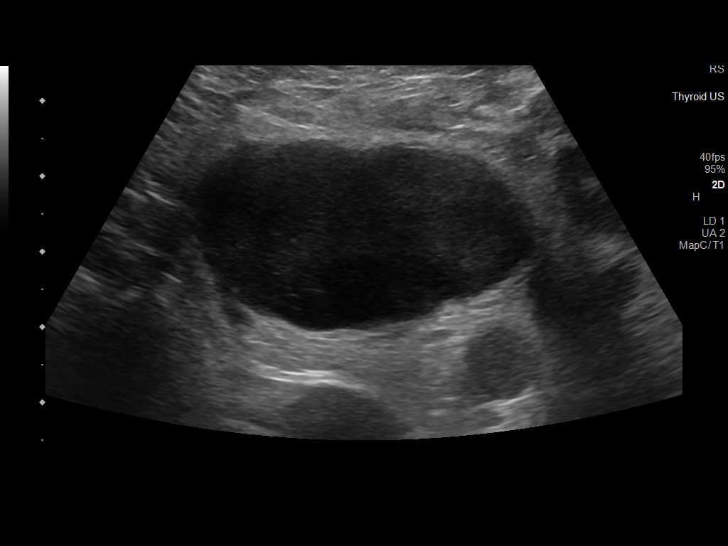
[im 4/17]
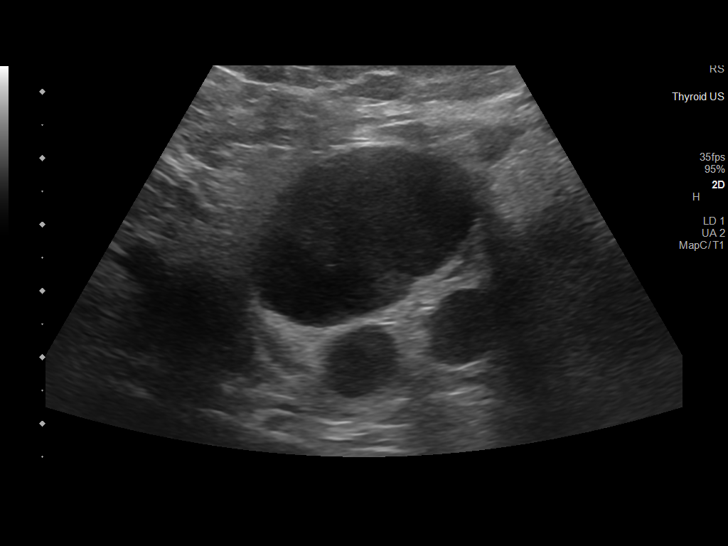
[im 5/17]
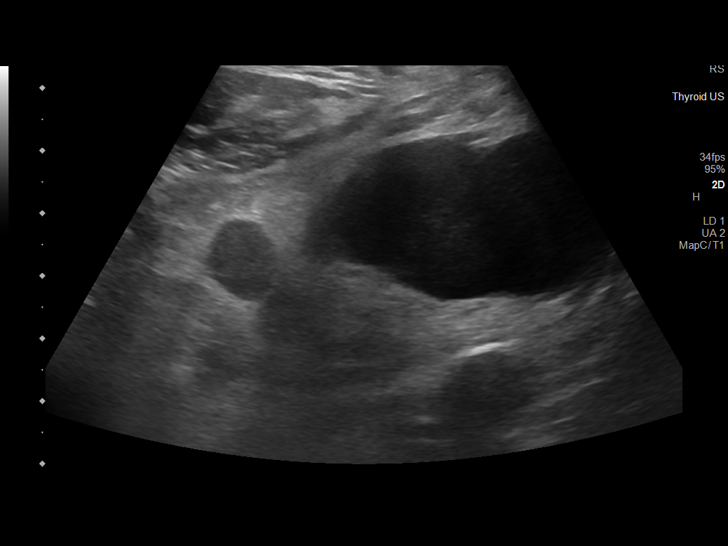
[im 6/17]
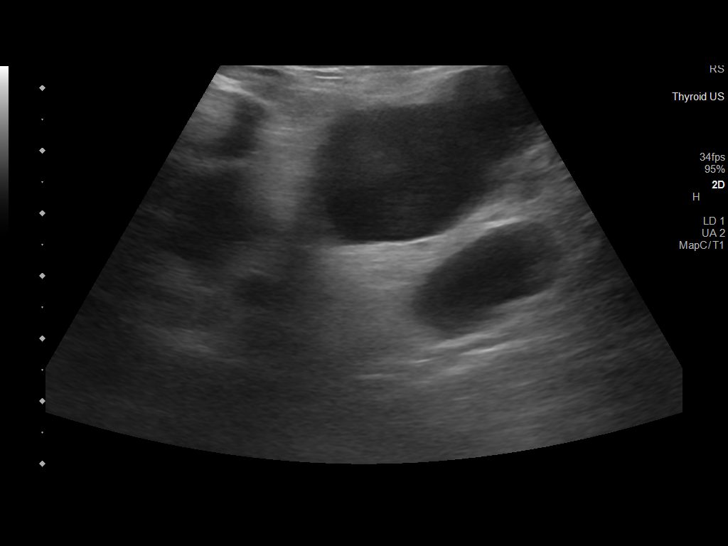
[im 8/17]
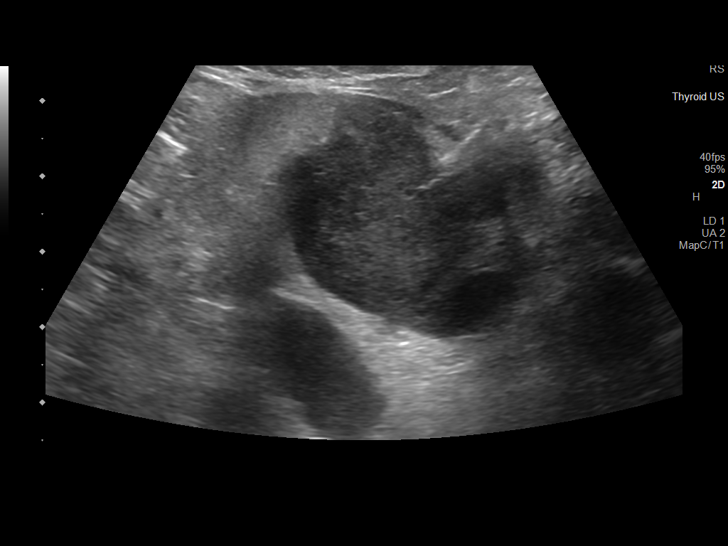
[im 9/17]
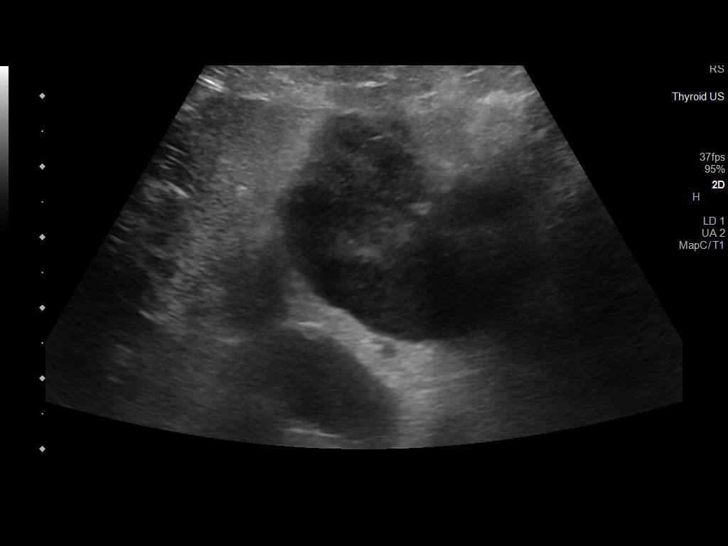
[im 10/17]
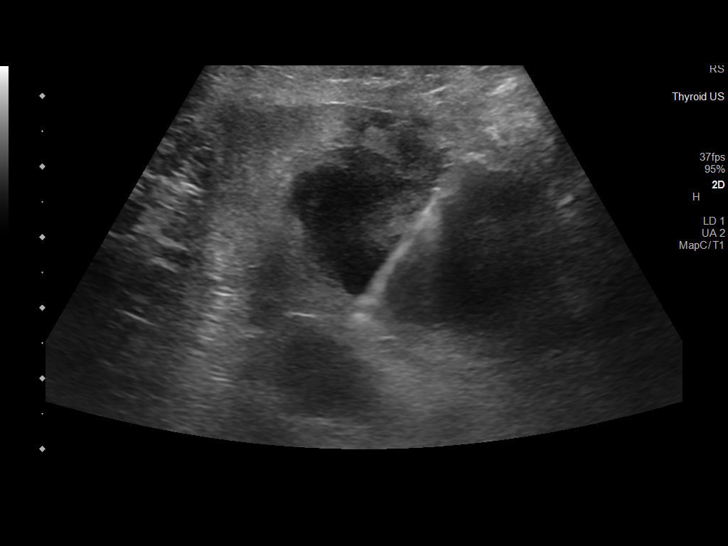
[im 12/17]
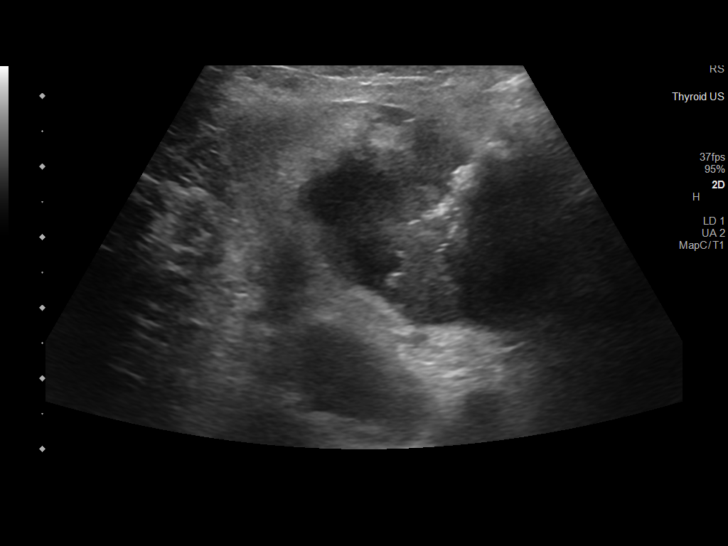
[im 13/17]
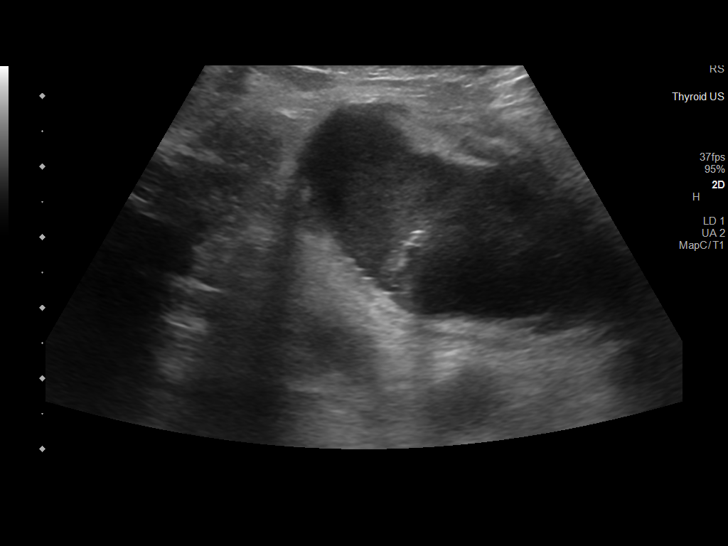
[im 14/17]
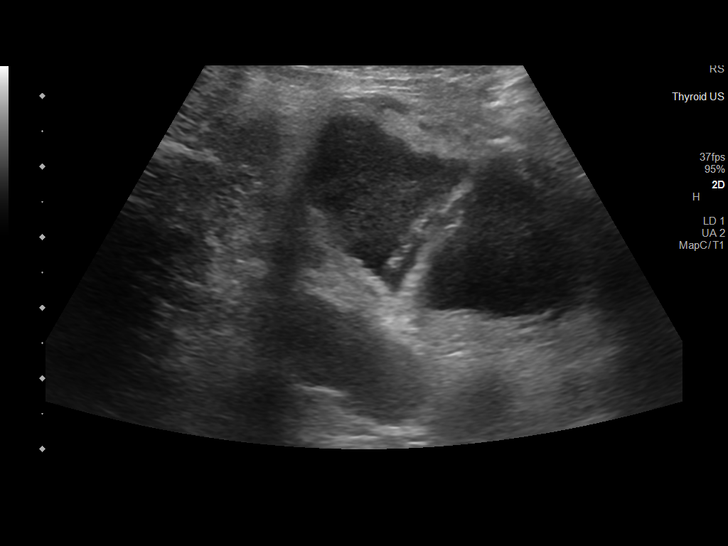
[im 16/17]
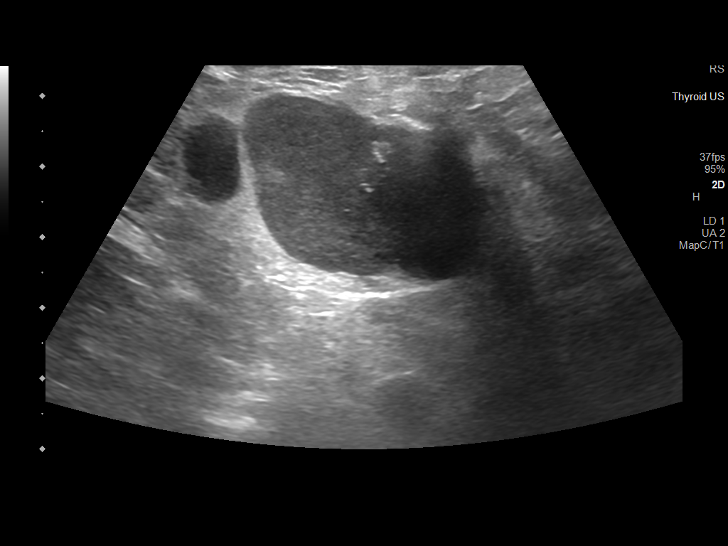
[im 17/17]
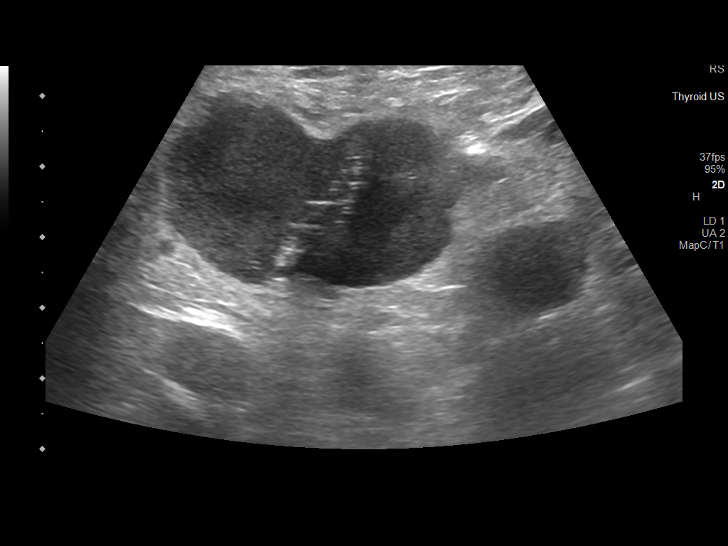

[13 of 17 positions shown; findings below may reference images not displayed]

EXAM:
ULTRASOUND GUIDED core BIOPSY OF right axillary lymph node

MEDICATIONS:
None.

ANESTHESIA/SEDATION:
Fentanyl 50 mcg IV; Versed 1 mg IV

Moderate Sedation Time:  15

The patient was continuously monitored during the procedure by the
interventional radiology nurse under my direct supervision.

PROCEDURE:
The procedure, risks, benefits, and alternatives were explained to
the patient. Questions regarding the procedure were encouraged and
answered. The patient understands and consents to the procedure.

The anterior right axilla was prepped with chlorhexidine in a
sterile fashion, and a sterile drape was applied covering the
operative field. A sterile gown and sterile gloves were used for the
procedure. Ultrasound evaluation demonstrated large right axillary
lymph node. The procedure was planned. Local anesthesia was provided
with 1% Lidocaine at the planned skin entry site as well as deeper
along the capsule of the lymph node. A small skin nick was made. A
17 gauge, 10 cm introducer needle was directed under ultrasound
guidance into the periphery of the enlarged lymph node. Next, a
total of 4 18 gauge core biopsies were obtained. The samples were
placed in saline and sent to Pathology. The introducer needle was
removed. Postprocedure ultrasound demonstrated no evidence of
hematoma or fluid collection in the right axilla. Hemostasis was
obtained with manual compression. The patient tolerated the
procedure well and was transferred to the floor in stable condition.

COMPLICATIONS:
None immediate.
FINDINGS: Right axillary lymphadenopathy.
IMPRESSION: Technically successful ultrasound-guided core biopsy of
pathologically enlarged right axillary lymph node.

## 2020-01-04 MED ORDER — SODIUM CHLORIDE 0.9 % IV SOLN: INTRAVENOUS | Status: DC

## 2020-01-04 MED ORDER — MIDAZOLAM HCL 2 MG/2ML IJ SOLN
INTRAMUSCULAR | Status: AC | PRN
  Administered 2020-01-04 (×2): 0.5 mg via INTRAVENOUS

## 2020-01-04 MED ORDER — MIDAZOLAM HCL 2 MG/2ML IJ SOLN
INTRAMUSCULAR | Status: AC
  Filled 2020-01-04: qty 2

## 2020-01-04 MED ORDER — METOPROLOL TARTRATE 5 MG/5ML IV SOLN
5.0000 mg | Freq: Four times a day (QID) | INTRAVENOUS | Status: DC | PRN
  Administered 2020-01-04 – 2020-01-06 (×4): 5 mg via INTRAVENOUS
  Filled 2020-01-04 (×4): qty 5

## 2020-01-04 MED ORDER — FENTANYL CITRATE (PF) 100 MCG/2ML IJ SOLN
INTRAMUSCULAR | Status: AC
  Filled 2020-01-04: qty 2

## 2020-01-04 MED ORDER — LIDOCAINE HCL (PF) 1 % IJ SOLN
INTRAMUSCULAR | Status: AC
  Filled 2020-01-04: qty 30

## 2020-01-04 MED ORDER — FENTANYL CITRATE (PF) 100 MCG/2ML IJ SOLN
INTRAMUSCULAR | Status: AC | PRN
  Administered 2020-01-04 (×2): 25 ug via INTRAVENOUS

## 2020-01-04 NOTE — Significant Event (Addendum)
Rapid Response Event Note   Reason for Call :  Called as a second set of eyes for pt who is tachypneic/labored.   Pt has been here sine 10/9. He had an axillary node biopsy today. Prior to his procedure, pt was confused, agitated,  with clear speech. After his procedure, RR was called d/t disorientation. At that time, he was confused, tachypneic but not labored, with clear speech. Now he remains confused and tachypneic, but now breathing is labored and his speech sounds slurred/nasal.    Initial Focused Assessment:  Pt laying in the bed with eyes opened. Pt breathing is fast, deep, and mildly labored. Pt is very confused. He knows his name and birthdate. He will follow commands. He has equal strength in all extremities. His pupils are 5, equal, and sluggish.  Lungs clear t/o. Skin warm and dry. T-98.5, HR-117, BP-118/49, RR-42, SpO2-100% on 3L [place].    Interventions:  CBG-96 ABG PCXR EKG-ST CBC/CMP/LA/Trop/D-dimer/BNP  Plan of Care:  Await ABG/lab results and follow-up with MD. Continue to monitor respiratory status closely. Call RRT if further assistance needed.    Update: 0030: ABG-7.42/43.93/27.5, HGB-7.0(was 7.2), D-dimer-0.91. Pt still very tachypneic(RR-40s). CTA to r/o PE ordered.   Event Summary:  MD Notified: Dr. Cyd Silence at 2140 Call Brian Ochoa, Brian Ochoa Anderson, RN

## 2020-01-04 NOTE — Progress Notes (Signed)
Walked in to do hourly rounding on patient. Patient appears to have an incresed work of brething. Patient is in bed resting but has increased  laboured breathing with no activity. Rapid RN called to bedside to assess. Orders placed for ABG. Dr Marlyce Huge paged. Awaiting call back.

## 2020-01-04 NOTE — Procedures (Signed)
Interventional Radiology Procedure Note  Procedure: Ultrasound guided right axillary lymph node biopsy  Findings: Please refer to procedural dictation for full description. 18 ga core x4.  Complications: None immediate  Estimated Blood Loss: <5 mL  Recommendations: Follow up biopsy results. No bedrest/activity restrictions.   Ruthann Cancer, MD

## 2020-01-04 NOTE — Progress Notes (Signed)
PT Cancellation Note  Patient Details Name: Brian Ochoa MRN: [id number] DOB: 1960-10-28   Cancelled Treatment:     RN deferred treatment due to pt being agitated and HR being elevated  Lyanne Co, DPT Acute Rehabilitation Services [phone]   [place] 01/04/2020, 11:31 AM

## 2020-01-04 NOTE — Consult Note (Addendum)
[place]  [phone] [phone]   [place]  Referral MD: Dr. Charlynne Cousins  Reason for Referral: Lymphadenopathy  HPI: Mr. Villamar is a 59 year old male with a past medical history significant for TIA.  He was recently hospitalized from 12/15/2019 through 12/22/2019 for altered mental status.  He again presented to the hospital with altered mental status.  During his prior hospitalization, he was worked up for sepsis but no source was identified.  During that hospitalization, he was found to be positive for hepatitis C and had left elevated liver enzymes.  He was seen by GI during that hospital admission and elevated liver enzymes were thought to be due to to doxycycline.  The patient's family states that he never got back to baseline following that hospital admission.  He has had worsening fatigue, weight loss, and poor appetite for at least 2 months.  They feel as though his cognition has worsened since prior hospital discharge.  Labs from this admission have been reviewed and upon presentation to the emergency room his WBC was 7.7, hemoglobin 7.8, and platelets were 252,000.  His calcium was 7.9, albumin 1.7, AST 134, ALT 209, alkaline phosphatase 289, and T bili 0.7.  A CT of the neck showed extensive left-sided adenopathy without a primary lesion concerning for malignancy.  A CT of the chest, abdomen, pelvis showed pathologically enlarged lymph nodes in the right axilla and left groin in addition to hepatosplenomegaly which is concerning for lymphoproliferative disorder such as lymphoma, hepatic steatosis, multiple indeterminate pulmonary nodules measuring up to 6 mm.  MRI of the brain showed 7 punctate foci of restricted diffusion within the cortical and subcortical brain of both hemispheres consistent with small acute infarctions.  The patient underwent an image guided right axillary lymph node biopsy by IR today.  Mr. Osmon  had just returned to his room from IR at the time my visit.  There was no family at the bedside.  He opens his eyes and looks at me.  He is able to tell me his name and speak briefly to me.  He is unable to tell me where he is or what year it is.  History is taken from his chart.  The patient has been having confusion/altered mental status, dizziness, and a fall prior to admission.  Family reports worsening fatigue, weight loss, and poor appetite for at least 2 months.  The family also reported occasional vomiting.  He has not been having any fevers, chills, abdominal pain.  Medical oncology was asked see the patient to make recommendations regarding his lymphadenopathy and concern for lymphoma/lymphoproliferative disorder.   Past Medical History:  Diagnosis Date  . TIA (transient ischemic attack)   :  History reviewed. No pertinent surgical history.:  Current Facility-Administered Medications  Medication Dose Route Frequency Provider Last Rate Last Admin  . 0.9 %  sodium chloride infusion  250 mL Intravenous PRN Marcelyn Bruins, MD      . 0.9 %  sodium chloride infusion   Intravenous Continuous Charlynne Cousins, MD 100 mL/hr at 01/04/20 1146 New Bag at 01/04/20 1146  . acetaminophen (TYLENOL) tablet 650 mg  650 mg Oral Q6H PRN Marcelyn Bruins, MD       Or  . acetaminophen (TYLENOL) suppository 650 mg  650 mg Rectal Q6H PRN Marcelyn Bruins, MD      . albuterol (PROVENTIL) (2.5 MG/3ML) 0.083% nebulizer solution 3 mL  3 mL Inhalation Q6H PRN  Marcelyn Bruins, MD      . haloperidol lactate (HALDOL) injection 2 mg  2 mg Intravenous Q6H PRN Charlynne Cousins, MD   2 mg at 01/03/20 1638  . haloperidol lactate (HALDOL) injection 5 mg  5 mg Intravenous Once Aileen Fass, Tammi Klippel, MD      . metoprolol tartrate (LOPRESSOR) injection 5 mg  5 mg Intravenous Q6H PRN Charlynne Cousins, MD   5 mg at 01/04/20 1148  . multivitamin with minerals tablet 1 tablet  1 tablet Oral Daily Marcelyn Bruins, MD   1 tablet at 01/03/20 [phone]  . pantoprazole (PROTONIX) EC tablet 40 mg  40 mg Oral QAC breakfast Marcelyn Bruins, MD   40 mg at 01/03/20 0835  . sodium chloride flush (NS) 0.9 % injection 3 mL  3 mL Intravenous Q12H Marcelyn Bruins, MD   3 mL at 01/04/20 0828  . sodium chloride flush (NS) 0.9 % injection 3 mL  3 mL Intravenous PRN Marcelyn Bruins, MD      . thiamine (B-1) injection 100 mg  100 mg Intravenous Daily Donnetta Simpers, MD   100 mg at 01/04/20 [id number]     No Known Allergies:  Family History  Problem Relation Age of Onset  . Hypertension Mother   . Hypertension Father   :  Social History   Socioeconomic History  . Marital status: Married    Spouse name: Not on file  . Number of children: Not on file  . Years of education: Not on file  . Highest education level: Not on file  Occupational History  . Not on file  Tobacco Use  . Smoking status: Current Every Day Smoker    Types: Cigars  . Smokeless tobacco: Current User    Types: Chew  Substance and Sexual Activity  . Alcohol use: Not Currently  . Drug use: Never  . Sexual activity: Not on file  Other Topics Concern  . Not on file  Social History Narrative  . Not on file   Social Determinants of Health   Financial Resource Strain:   . Difficulty of Paying Living Expenses: Not on file  Food Insecurity:   . Worried About Charity fundraiser in the Last Year: Not on file  . Ran Out of Food in the Last Year: Not on file  Transportation Needs:   . Lack of Transportation (Medical): Not on file  . Lack of Transportation (Non-Medical): Not on file  Physical Activity:   . Days of Exercise per Week: Not on file  . Minutes of Exercise per Session: Not on file  Stress:   . Feeling of Stress : Not on file  Social Connections:   . Frequency of Communication with Friends and Family: Not on file  . Frequency of Social Gatherings with Friends and Family: Not on file  . Attends Religious  Services: Not on file  . Active Member of Clubs or Organizations: Not on file  . Attends Archivist Meetings: Not on file  . Marital Status: Not on file  Intimate Partner Violence:   . Fear of Current or Ex-Partner: Not on file  . Emotionally Abused: Not on file  . Physically Abused: Not on file  . Sexually Abused: Not on file  :  Review of Systems: Patient unable to report review of systems.  Review of systems taken from chart.  Exam: Patient Vitals for the past 24 hrs:  BP Temp Temp src Pulse Resp  SpO2 Weight  01/04/20 1053 -- 99 F (37.2 C) Oral -- -- -- --  01/04/20 1045 -- -- -- -- -- 97 % --  01/04/20 1000 108/75 99 F (37.2 C) Oral (!) 122 18 93 % --  01/04/20 0837 127/74 98.2 F (36.8 C) Oral 92 16 93 % --  01/04/20 0635 (!) 91/57 98.6 F (37 C) Oral (!) 116 20 96 % --  01/04/20 0400 -- 99.2 F (37.3 C) -- -- (!) 22 96 % 78 kg  01/04/20 0356 100/68 -- -- (!) 110 -- -- --  01/04/20 0200 -- -- Oral (!) 119 -- -- --  01/04/20 0100 -- 98.7 F (37.1 C) Oral (!) 119 20 -- --  01/04/20 0000 -- 98.6 F (37 C) Oral (!) 104 -- -- --  01/03/20 2300 -- 99.3 F (37.4 C) Oral (!) 107 (!) 22 -- --  01/03/20 2251 116/63 99.4 F (37.4 C) -- (!) 107 (!) 22 -- --  01/03/20 2200 98/70 -- Oral (!) 110 (!) 22 94 % --  01/03/20 2018 98/70 99.3 F (37.4 C) -- (!) 107 (!) 22 -- --  01/03/20 1925 103/71 98.6 F (37 C) -- (!) 150 (!) 22 -- --  01/03/20 1924 103/71 99.3 F (37.4 C) Oral (!) 150 (!) 22 95 % --    General: Chronically ill-appearing, opens eyes but does not interact Eyes:  no scleral icterus.     Lymphatics: Palpable left cervical, right axillary, left inguinal lymphadenopathy. Respiratory: lungs were clear bilaterally without wheezing or crackles.   Cardiovascular:  Regular rate and rhythm, S1/S2, without murmur, rub or gallop.  There was no pedal edema.   GI: Soft, nontender, I was unable to palpate hepatosplenomegaly Skin: Abrasion near right eye Neuro:  Somnolent, opens eyes, oriented to person only.   Lab Results  Component Value Date   WBC 8.0 01/03/2020   HGB 7.2 (L) 01/03/2020   HCT 24.4 (L) 01/03/2020   PLT 248 01/03/2020   GLUCOSE 112 (H) 01/03/2020   ALT 193 (H) 01/02/2020   AST 106 (H) 01/02/2020   NA 133 (L) 01/03/2020   K 4.0 01/03/2020   CL 99 01/03/2020   CREATININE 0.80 01/03/2020   BUN 9 01/03/2020   CO2 24 01/03/2020    CT Head Wo Contrast  Result Date: 01/01/2020 CLINICAL DATA:  Mental status change EXAM: CT HEAD WITHOUT CONTRAST TECHNIQUE: Contiguous axial images were obtained from the base of the skull through the vertex without intravenous contrast. COMPARISON:  MRI 12/16/2019, CT 12/15/2019 FINDINGS: Brain: No evidence of acute infarction, hemorrhage, hydrocephalus, extra-axial collection, visible mass lesion or mass effect. Basal cisterns are patent. Borderline low positioning of the right cerebellar tonsil projecting approximately 5 mm below the foramen magnum but with otherwise morphologically normal appearance of the posterior fossa. Vascular: No hyperdense vessel or unexpected calcification. Skull: No calvarial fracture or suspicious osseous lesion. No scalp swelling or hematoma. Sinuses/Orbits: Paranasal sinuses are clear. Left mastoid effusion. Right mastoid air cells are predominantly clear. Partial pneumatization of the petrous apices. Middle ear cavities are clear. Included orbital structures are unremarkable. Other: Leftward nasal septal deviation with a contacting left-sided nasal septal spur and prominent right concha bullosa. IMPRESSION: 1. No acute intracranial findings. 2. Low positioning of the right cerebellar tonsil projecting approximately 5 mm below the foramen magnum compatible with cerebellar tonsillar ectopia, nonspecific finding which can be asymptomatic or on the spectrum of Chiari 1 malformation. 3. Left mastoid effusion. 4. Leftward nasal septal deviation  with a contacting left-sided nasal septal  spur and prominent right concha bullosa. Electronically Signed   By: Lovena Le M.D.   On: 01/01/2020 17:34   CT Head Wo Contrast  Result Date: 12/15/2019 CLINICAL DATA:  Mental status change EXAM: CT HEAD WITHOUT CONTRAST TECHNIQUE: Contiguous axial images were obtained from the base of the skull through the vertex without intravenous contrast. COMPARISON:  09/09/2019 head CT. FINDINGS: Brain: No acute infarct or intracranial hemorrhage. No mass lesion. No midline shift, ventriculomegaly or extra-axial fluid collection. Vascular: No hyperdense vessel or unexpected calcification. Skull: Negative for fracture or focal lesion. Sinuses/Orbits: Normal orbits. Clear paranasal sinuses. No mastoid effusion. Other: None. IMPRESSION: No acute intracranial process. Electronically Signed   By: Primitivo Gauze M.D.   On: 12/15/2019 14:21   CT Soft Tissue Neck W Contrast  Result Date: 01/01/2020 CLINICAL DATA:  Lymphadenopathy.  Abnormal ultrasound. EXAM: CT NECK WITH CONTRAST TECHNIQUE: Multidetector CT imaging of the neck was performed using the standard protocol following the bolus administration of intravenous contrast. CONTRAST:  92mL OMNIPAQUE IOHEXOL 300 MG/ML  SOLN COMPARISON:  Soft tissue neck ultrasound 12/31/2019 FINDINGS: Pharynx and larynx: Study is mildly degraded by patient motion throughout. No focal mucosal or submucosal lesions are present. Nasopharynx is clear. Soft palate and tongue base are within normal limits. Epiglottis is normal. Hypopharynx is within normal limits. Vocal cord midline insert metric. The trachea is normal. Salivary glands: The submandibular and parotid glands and ducts are within normal limits. Thyroid: Negative Lymph nodes: Extensive left-sided adenopathy is present. Left submandibular node or conglomeration of nodes measures 3.6 x 1.9 x 1.7 cm. Enlarged level 2 and level 3 lymph nodes are present the largest left level 2 lymph node measures at least 2.5 x 1.8 cm. No  primary lesion is identified. Vascular: No focal vascular lesions or significant atherosclerotic changes present. Limited intracranial: Within normal limits. Visualized orbits: The globes and orbits are within normal limits. Mastoids and visualized paranasal sinuses: The paranasal sinuses and mastoid air cells are clear. Skeleton: Degenerative changes are most evident C5-6 and C6-7. Straightening of the normal cervical lordosis is present. No focal lytic or blastic lesion is present. Upper chest: The lung apices are clear. The thoracic inlet is within normal limits. IMPRESSION: 1. Extensive left-sided adenopathy without a primary lesion. This is concerning for malignancy. This may represent metastatic disease of unknown primary versus lymphoma or leukemia. 2. No primary lesion is identified. 3. Degenerative changes of the cervical spine are most evident C5-6 and C6-7. Electronically Signed   By: San Morelle M.D.   On: 01/01/2020 17:42   MR BRAIN WO CONTRAST  Result Date: 01/02/2020 CLINICAL DATA:  Delirium.  Confusion.  Mental status changes. EXAM: MRI HEAD WITHOUT CONTRAST TECHNIQUE: Multiplanar, multiecho pulse sequences of the brain and surrounding structures were obtained without intravenous contrast. COMPARISON:  Head CT 01/01/2020.  MRI 12/16/2019. FINDINGS: Brain: 7 punctate foci restricted diffusion seen scattered within the cortical and subcortical brain of both hemispheres consistent with small infarctions. No large confluent area of restricted diffusion. Within both cerebral hemispheres, more extensive on the left than the right, there are areas of abnormal T2 and FLAIR signal affecting the cortical and subcortical brain. This is most notable at the left frontal vertex and in the left lateral temporal lobe. The last study was motion degraded and abbreviated, but the FLAIR and T2 imaging is of reasonable quality in those changes do not appear to have been present at that time. There is  no  evidence of mass, hemorrhage, hydrocephalus or extra-axial collection. The differential diagnosis is that of sequela of ischemic disease versus is demyelinating disease versus autoimmune disease or vasculitis. No sign of hemorrhage, hydrocephalus or extra-axial collection. Small focus of hemosiderin in the left parietal cortical and subcortical brain is 1 exception to that but is of uncertain chronicity. I think one can appreciate some susceptibility effect in this location on the diffusion imaging late September. Vascular: Major vessels at the base of the brain show flow. Skull and upper cervical spine: Negative Sinuses/Orbits: Sinuses are clear.  Orbits negative. Other: Small mastoid effusions. IMPRESSION: 1. 7 punctate foci of restricted diffusion seen scattered within the cortical and subcortical brain of both hemispheres consistent with small acute infarctions. Other areas indistinct T2 and FLAIR signal affecting the cortical and subcortical brain, separate from those areas of small restricted diffusion. This process is most notable at the left frontal vertex and in the left lateral temporal lobe. The differential diagnosis is that of ischemic disease versus demyelinating disease versus autoimmune cerebritis versus infectious cerebritis versus vasculitis. Electronically Signed   By: Nelson Chimes M.D.   On: 01/02/2020 09:39   MR BRAIN WO CONTRAST  Result Date: 12/16/2019 CLINICAL DATA:  Provided history: Mental status change, unknown cause. Additional provided: Altered and afebrile, tachycardic. EXAM: MRI HEAD WITHOUT CONTRAST TECHNIQUE: Multiplanar, multiecho pulse sequences of the brain and surrounding structures were obtained without intravenous contrast. COMPARISON:  Noncontrast head CT 12/15/2019. FINDINGS: Brain: The patient was unable to tolerate the full examination. As a result, only axial and coronal diffusion-weighted sequences, an axial T1 weighted sequence and an axial T2 weighted sequence could  be obtained. The sagittal T1 weighted and axial T2 weighted sequences are moderately motion degraded. Cerebral volume is normal for age. There is no acute infarct. Within described limitations, there is no evidence of an intracranial mass. No extra-axial fluid collection is identified. No midline shift. Vascular: Expected proximal arterial flow voids. Skull and upper cervical spine: Within described limitations, no focal marrow lesion is identified. Sinuses/Orbits: Visualized orbits show no acute finding. Mild ethmoid sinus mucosal thickening. Trace left mastoid effusion. IMPRESSION: Prematurely terminated and motion degraded examination as described. The diffusion-weighted imaging is of good quality. There is no evidence of acute infarct. Mild ethmoid sinus mucosal thickening. Trace left mastoid effusion. Electronically Signed   By: Kellie Simmering DO   On: 12/16/2019 10:29   US SOFT TISSUE HEAD & NECK (NON-THYROID)  Result Date: 12/31/2019 CLINICAL DATA:  Left-sided cervical lymphadenopathy. Weakness and weight loss. EXAM: ULTRASOUND OF HEAD/NECK SOFT TISSUES TECHNIQUE: Ultrasound examination of the head and neck soft tissues was performed in the area of clinical concern. COMPARISON:  None. FINDINGS: Patient's palpable area of concern within the left neck correlates with several clustered potentially pathologically enlarged cervical lymph nodes (representative image 28). Dominant left-sided cervical lymph node measures 1.4 cm in greatest short axis diameter with obliteration of the fatty hilum (image 30). IMPRESSION: Patient's palpable area of concern within the left neck correlates with left cervical lymphadenopathy, nonspecific and while potentially infectious or inflammatory in etiology, malignancy is not excluded on the basis of this examination. Clinical correlation is advised. Further evaluation with contrast-enhanced neck CT could be performed as indicated. These results will be called to the ordering  clinician or representative by the Radiologist Assistant, and communication documented in the PACS or [employer]. Electronically Signed   By: Sandi Mariscal M.D.   On: 12/31/2019 15:11   CT CHEST ABDOMEN  PELVIS W CONTRAST  Result Date: 01/01/2020 CLINICAL DATA:  Lymphadenopathy in the neck. Concern for metastatic disease. EXAM: CT CHEST, ABDOMEN, AND PELVIS WITH CONTRAST TECHNIQUE: Multidetector CT imaging of the chest, abdomen and pelvis was performed following the standard protocol during bolus administration of intravenous contrast. CONTRAST:  172mL OMNIPAQUE IOHEXOL 300 MG/ML  SOLN COMPARISON:  None. FINDINGS: CT CHEST FINDINGS Cardiovascular: The heart size is unremarkable. There is a trace pericardial effusion. Minimal atherosclerotic changes are noted of the thoracic aorta. There is no dissection. There is no large centrally located pulmonary embolism. Detection of smaller pulmonary emboli is limited by contrast bolus timing and technique. Mediastinum/Nodes: -- No mediastinal lymphadenopathy. --there are few mildly prominent hilar lymph nodes. For example on the left there is a 9 mm hilar lymph node (axial series 3, image 27). --there is no significant axillary adenopathy on the left. Multiple pathologically enlarged axillary lymph nodes are noted on the right measuring up to approximately 2.7 x 5.6 cm. -- No supraclavicular lymphadenopathy. -- Normal thyroid gland where visualized. -  Unremarkable esophagus. Lungs/Pleura: There is a 3 mm pulmonary nodule in the right upper lobe (axial series 4, image 22). There is an additional 3 mm pulmonary nodule in the right upper lobe (axial series 4, image 35). There is a ground-glass airspace opacity in the right upper lobe measuring approximately 2.2 cm (axial series 4, image 49). There is a 5-6 mm pulmonary nodule in the right upper lobe (axial series 4, image 70). There is a 6 mm pulmonary nodule in the right middle lobe (axial series 4, image 104). There is  atelectasis in the right lower lobe. Musculoskeletal: No chest wall abnormality. No bony spinal canal stenosis. Mild bilateral gynecomastia is noted. CT ABDOMEN PELVIS FINDINGS Hepatobiliary: There is hepatic steatosis. The liver is enlarged measuring approximately 19 cm craniocaudad. There is no discrete hepatic mass. Normal gallbladder.There is no biliary ductal dilation. Pancreas: Normal contours without ductal dilatation. No peripancreatic fluid collection. Spleen: The spleen is significantly enlarged measuring approximately 19.4 cm craniocaudad. Adrenals/Urinary Tract: --Adrenal glands: Unremarkable. --Right kidney/ureter: There is a low-attenuation 2.1 cm structure in the right kidney measuring approximately 21 Hounsfield units. There is no hydronephrosis. --Left kidney/ureter: No hydronephrosis or radiopaque kidney stones. --Urinary bladder: Unremarkable. Stomach/Bowel: --Stomach/Duodenum: No hiatal hernia or other gastric abnormality. Normal duodenal course and caliber. --Small bowel: Unremarkable. --Colon: There are few scattered colonic diverticula without CT evidence for diverticulitis. --Appendix: Normal. Vascular/Lymphatic: Atherosclerotic calcification is present within the non-aneurysmal abdominal aorta, without hemodynamically significant stenosis. --No retroperitoneal lymphadenopathy. --No mesenteric lymphadenopathy. --there is a pathologically enlarged left inguinal lymph node measuring approximately 2.5 cm (axial series 3, image 124). Reproductive: The prostate gland is enlarged. Other: No ascites or free air. The abdominal wall is normal. Musculoskeletal. No acute displaced fractures. IMPRESSION: 1. Pathologically enlarged lymph nodes in the right axilla and left groin in addition to hepatosplenomegaly is concerning for a lymphoproliferative disorder such as lymphoma. The lymph nodes in the right axilla and left groin are readily amenable to percutaneous biopsy. 2. Hepatic steatosis. 3.  Indeterminate 2.1 cm nodule in the right kidney. This is favored to represent a proteinaceous or hemorrhagic cyst. Follow-up with a nonemergent renal ultrasound as an outpatient is recommended for confirmation. 4. Multiple indeterminate pulmonary nodules are noted measuring up to approximately 6 mm. Follow-up non-contrast CT recommended at 3-6 months to confirm persistence. If unchanged, and solid component remains <6 mm, annual CT is recommended until 5 years of stability has been established. If  persistent these nodules should be considered highly suspicious if the solid component of the nodule is 6 mm or greater in size and enlarging. This recommendation follows the consensus statement: Guidelines for Management of Incidental Pulmonary Nodules Detected on CT Images: From the Fleischner Society 2017; Radiology 2017; 284:228-243. 5. There is a subtle 2.2 cm ground-glass opacity in the right upper lobe as detailed above. This is favored to represent an infectious or inflammatory process. Attention on follow-up CT is recommended. Aortic Atherosclerosis (ICD10-I70.0). Electronically Signed   By: Constance Holster M.D.   On: 01/01/2020 20:51   DG Chest Portable 1 View  Result Date: 01/01/2020 CLINICAL DATA:  Cough EXAM: PORTABLE CHEST 1 VIEW COMPARISON:  December 15, 2019 FINDINGS: The cardiomediastinal silhouette is unchanged in contour.RIGHT rounded retrocardiac opacity may reflect a hiatal hernia. No pleural effusion. No pneumothorax. No acute pleuroparenchymal abnormality. Visualized abdomen is unremarkable. Multilevel degenerative changes of the thoracic spine. IMPRESSION: No acute cardiopulmonary abnormality. Electronically Signed   By: Valentino Saxon MD   On: 01/01/2020 16:32   DG Chest Port 1 View  Result Date: 12/15/2019 CLINICAL DATA:  Sepsis EXAM: PORTABLE CHEST 1 VIEW COMPARISON:  None. FINDINGS: The heart size and mediastinal contours are within normal limits. No focal airspace consolidation,  pleural effusion, or pneumothorax. The visualized skeletal structures are unremarkable. IMPRESSION: No active disease. Electronically Signed   By: Davina Poke D.O.   On: 12/15/2019 14:03   EEG adult  Result Date: 01/03/2020 Lora Havens, MD     01/03/2020  2:23 PM Patient Name: Daisy Mcneel MRN: [id number] Epilepsy Attending: Lora Havens Referring Physician/Provider: Dr. Charlynne Cousins Date: 01/03/2020 Duration: 26.20 mins Patient history: 59 year old male with worsening encephalopathy which has progressed over the last 6 to 7 weeks.  EEG to evaluate for seizures. Level of alertness: Awake, asleep AEDs during EEG study: None Technical aspects: This EEG study was done with scalp electrodes positioned according to the 10-20 International system of electrode placement. Electrical activity was acquired at a sampling rate of 500Hz  and reviewed with a high frequency filter of 70Hz  and a low frequency filter of 1Hz . EEG data were recorded continuously and digitally stored. Description: No clear posterior dominant rhythm was seen.  EEG showed continued generalized polymorphic 3 to 6 Hz theta-delta slowing.  Hyperventilation and photic stimulation were not performed.   ABNORMALITY -Continuous slow, generalized IMPRESSION: This study is suggestive of moderate diffuse encephalopathy, nonspecific to etiology.  No seizures or epileptiform discharges were seen throughout the recording. Lora Havens   US Abdomen Limited RUQ  Result Date: 12/15/2019 CLINICAL DATA:  Transaminitis EXAM: ULTRASOUND ABDOMEN LIMITED RIGHT UPPER QUADRANT COMPARISON:  None. FINDINGS: Gallbladder: No gallstones or wall thickening visualized. No sonographic Murphy sign noted by sonographer. Common bile duct: Diameter: 4 mm Liver: The liver is enlarged measuring 18.7 cm in the midclavicular line. There is diffuse increased liver echotexture consistent with hepatic steatosis. No focal liver abnormality. Portal vein is patent  on color Doppler imaging with normal direction of blood flow towards the liver. Other: Right kidney is unremarkable measuring 12.4 cm. No free fluid. IMPRESSION: 1. Hepatomegaly, with diffuse hepatic steatosis. 2. Otherwise unremarkable exam. Electronically Signed   By: Randa Ngo M.D.   On: 12/15/2019 18:25     CT Head Wo Contrast  Result Date: 01/01/2020 CLINICAL DATA:  Mental status change EXAM: CT HEAD WITHOUT CONTRAST TECHNIQUE: Contiguous axial images were obtained from the base of the skull through the vertex without intravenous  contrast. COMPARISON:  MRI 12/16/2019, CT 12/15/2019 FINDINGS: Brain: No evidence of acute infarction, hemorrhage, hydrocephalus, extra-axial collection, visible mass lesion or mass effect. Basal cisterns are patent. Borderline low positioning of the right cerebellar tonsil projecting approximately 5 mm below the foramen magnum but with otherwise morphologically normal appearance of the posterior fossa. Vascular: No hyperdense vessel or unexpected calcification. Skull: No calvarial fracture or suspicious osseous lesion. No scalp swelling or hematoma. Sinuses/Orbits: Paranasal sinuses are clear. Left mastoid effusion. Right mastoid air cells are predominantly clear. Partial pneumatization of the petrous apices. Middle ear cavities are clear. Included orbital structures are unremarkable. Other: Leftward nasal septal deviation with a contacting left-sided nasal septal spur and prominent right concha bullosa. IMPRESSION: 1. No acute intracranial findings. 2. Low positioning of the right cerebellar tonsil projecting approximately 5 mm below the foramen magnum compatible with cerebellar tonsillar ectopia, nonspecific finding which can be asymptomatic or on the spectrum of Chiari 1 malformation. 3. Left mastoid effusion. 4. Leftward nasal septal deviation with a contacting left-sided nasal septal spur and prominent right concha bullosa. Electronically Signed   By: Lovena Le M.D.    On: 01/01/2020 17:34   CT Head Wo Contrast  Result Date: 12/15/2019 CLINICAL DATA:  Mental status change EXAM: CT HEAD WITHOUT CONTRAST TECHNIQUE: Contiguous axial images were obtained from the base of the skull through the vertex without intravenous contrast. COMPARISON:  09/09/2019 head CT. FINDINGS: Brain: No acute infarct or intracranial hemorrhage. No mass lesion. No midline shift, ventriculomegaly or extra-axial fluid collection. Vascular: No hyperdense vessel or unexpected calcification. Skull: Negative for fracture or focal lesion. Sinuses/Orbits: Normal orbits. Clear paranasal sinuses. No mastoid effusion. Other: None. IMPRESSION: No acute intracranial process. Electronically Signed   By: Primitivo Gauze M.D.   On: 12/15/2019 14:21   CT Soft Tissue Neck W Contrast  Result Date: 01/01/2020 CLINICAL DATA:  Lymphadenopathy.  Abnormal ultrasound. EXAM: CT NECK WITH CONTRAST TECHNIQUE: Multidetector CT imaging of the neck was performed using the standard protocol following the bolus administration of intravenous contrast. CONTRAST:  25mL OMNIPAQUE IOHEXOL 300 MG/ML  SOLN COMPARISON:  Soft tissue neck ultrasound 12/31/2019 FINDINGS: Pharynx and larynx: Study is mildly degraded by patient motion throughout. No focal mucosal or submucosal lesions are present. Nasopharynx is clear. Soft palate and tongue base are within normal limits. Epiglottis is normal. Hypopharynx is within normal limits. Vocal cord midline insert metric. The trachea is normal. Salivary glands: The submandibular and parotid glands and ducts are within normal limits. Thyroid: Negative Lymph nodes: Extensive left-sided adenopathy is present. Left submandibular node or conglomeration of nodes measures 3.6 x 1.9 x 1.7 cm. Enlarged level 2 and level 3 lymph nodes are present the largest left level 2 lymph node measures at least 2.5 x 1.8 cm. No primary lesion is identified. Vascular: No focal vascular lesions or significant  atherosclerotic changes present. Limited intracranial: Within normal limits. Visualized orbits: The globes and orbits are within normal limits. Mastoids and visualized paranasal sinuses: The paranasal sinuses and mastoid air cells are clear. Skeleton: Degenerative changes are most evident C5-6 and C6-7. Straightening of the normal cervical lordosis is present. No focal lytic or blastic lesion is present. Upper chest: The lung apices are clear. The thoracic inlet is within normal limits. IMPRESSION: 1. Extensive left-sided adenopathy without a primary lesion. This is concerning for malignancy. This may represent metastatic disease of unknown primary versus lymphoma or leukemia. 2. No primary lesion is identified. 3. Degenerative changes of the cervical spine are most  evident C5-6 and C6-7. Electronically Signed   By: San Morelle M.D.   On: 01/01/2020 17:42   MR BRAIN WO CONTRAST  Result Date: 01/02/2020 CLINICAL DATA:  Delirium.  Confusion.  Mental status changes. EXAM: MRI HEAD WITHOUT CONTRAST TECHNIQUE: Multiplanar, multiecho pulse sequences of the brain and surrounding structures were obtained without intravenous contrast. COMPARISON:  Head CT 01/01/2020.  MRI 12/16/2019. FINDINGS: Brain: 7 punctate foci restricted diffusion seen scattered within the cortical and subcortical brain of both hemispheres consistent with small infarctions. No large confluent area of restricted diffusion. Within both cerebral hemispheres, more extensive on the left than the right, there are areas of abnormal T2 and FLAIR signal affecting the cortical and subcortical brain. This is most notable at the left frontal vertex and in the left lateral temporal lobe. The last study was motion degraded and abbreviated, but the FLAIR and T2 imaging is of reasonable quality in those changes do not appear to have been present at that time. There is no evidence of mass, hemorrhage, hydrocephalus or extra-axial collection. The  differential diagnosis is that of sequela of ischemic disease versus is demyelinating disease versus autoimmune disease or vasculitis. No sign of hemorrhage, hydrocephalus or extra-axial collection. Small focus of hemosiderin in the left parietal cortical and subcortical brain is 1 exception to that but is of uncertain chronicity. I think one can appreciate some susceptibility effect in this location on the diffusion imaging late September. Vascular: Major vessels at the base of the brain show flow. Skull and upper cervical spine: Negative Sinuses/Orbits: Sinuses are clear.  Orbits negative. Other: Small mastoid effusions. IMPRESSION: 1. 7 punctate foci of restricted diffusion seen scattered within the cortical and subcortical brain of both hemispheres consistent with small acute infarctions. Other areas indistinct T2 and FLAIR signal affecting the cortical and subcortical brain, separate from those areas of small restricted diffusion. This process is most notable at the left frontal vertex and in the left lateral temporal lobe. The differential diagnosis is that of ischemic disease versus demyelinating disease versus autoimmune cerebritis versus infectious cerebritis versus vasculitis. Electronically Signed   By: Nelson Chimes M.D.   On: 01/02/2020 09:39   MR BRAIN WO CONTRAST  Result Date: 12/16/2019 CLINICAL DATA:  Provided history: Mental status change, unknown cause. Additional provided: Altered and afebrile, tachycardic. EXAM: MRI HEAD WITHOUT CONTRAST TECHNIQUE: Multiplanar, multiecho pulse sequences of the brain and surrounding structures were obtained without intravenous contrast. COMPARISON:  Noncontrast head CT 12/15/2019. FINDINGS: Brain: The patient was unable to tolerate the full examination. As a result, only axial and coronal diffusion-weighted sequences, an axial T1 weighted sequence and an axial T2 weighted sequence could be obtained. The sagittal T1 weighted and axial T2 weighted sequences are  moderately motion degraded. Cerebral volume is normal for age. There is no acute infarct. Within described limitations, there is no evidence of an intracranial mass. No extra-axial fluid collection is identified. No midline shift. Vascular: Expected proximal arterial flow voids. Skull and upper cervical spine: Within described limitations, no focal marrow lesion is identified. Sinuses/Orbits: Visualized orbits show no acute finding. Mild ethmoid sinus mucosal thickening. Trace left mastoid effusion. IMPRESSION: Prematurely terminated and motion degraded examination as described. The diffusion-weighted imaging is of good quality. There is no evidence of acute infarct. Mild ethmoid sinus mucosal thickening. Trace left mastoid effusion. Electronically Signed   By: Kellie Simmering DO   On: 12/16/2019 10:29   US SOFT TISSUE HEAD & NECK (NON-THYROID)  Result Date: 12/31/2019 CLINICAL DATA:  Left-sided cervical lymphadenopathy. Weakness and weight loss. EXAM: ULTRASOUND OF HEAD/NECK SOFT TISSUES TECHNIQUE: Ultrasound examination of the head and neck soft tissues was performed in the area of clinical concern. COMPARISON:  None. FINDINGS: Patient's palpable area of concern within the left neck correlates with several clustered potentially pathologically enlarged cervical lymph nodes (representative image 28). Dominant left-sided cervical lymph node measures 1.4 cm in greatest short axis diameter with obliteration of the fatty hilum (image 30). IMPRESSION: Patient's palpable area of concern within the left neck correlates with left cervical lymphadenopathy, nonspecific and while potentially infectious or inflammatory in etiology, malignancy is not excluded on the basis of this examination. Clinical correlation is advised. Further evaluation with contrast-enhanced neck CT could be performed as indicated. These results will be called to the ordering clinician or representative by the Radiologist Assistant, and communication  documented in the PACS or [employer]. Electronically Signed   By: Sandi Mariscal M.D.   On: 12/31/2019 15:11   CT CHEST ABDOMEN PELVIS W CONTRAST  Result Date: 01/01/2020 CLINICAL DATA:  Lymphadenopathy in the neck. Concern for metastatic disease. EXAM: CT CHEST, ABDOMEN, AND PELVIS WITH CONTRAST TECHNIQUE: Multidetector CT imaging of the chest, abdomen and pelvis was performed following the standard protocol during bolus administration of intravenous contrast. CONTRAST:  171mL OMNIPAQUE IOHEXOL 300 MG/ML  SOLN COMPARISON:  None. FINDINGS: CT CHEST FINDINGS Cardiovascular: The heart size is unremarkable. There is a trace pericardial effusion. Minimal atherosclerotic changes are noted of the thoracic aorta. There is no dissection. There is no large centrally located pulmonary embolism. Detection of smaller pulmonary emboli is limited by contrast bolus timing and technique. Mediastinum/Nodes: -- No mediastinal lymphadenopathy. --there are few mildly prominent hilar lymph nodes. For example on the left there is a 9 mm hilar lymph node (axial series 3, image 27). --there is no significant axillary adenopathy on the left. Multiple pathologically enlarged axillary lymph nodes are noted on the right measuring up to approximately 2.7 x 5.6 cm. -- No supraclavicular lymphadenopathy. -- Normal thyroid gland where visualized. -  Unremarkable esophagus. Lungs/Pleura: There is a 3 mm pulmonary nodule in the right upper lobe (axial series 4, image 22). There is an additional 3 mm pulmonary nodule in the right upper lobe (axial series 4, image 35). There is a ground-glass airspace opacity in the right upper lobe measuring approximately 2.2 cm (axial series 4, image 49). There is a 5-6 mm pulmonary nodule in the right upper lobe (axial series 4, image 70). There is a 6 mm pulmonary nodule in the right middle lobe (axial series 4, image 104). There is atelectasis in the right lower lobe. Musculoskeletal: No chest wall  abnormality. No bony spinal canal stenosis. Mild bilateral gynecomastia is noted. CT ABDOMEN PELVIS FINDINGS Hepatobiliary: There is hepatic steatosis. The liver is enlarged measuring approximately 19 cm craniocaudad. There is no discrete hepatic mass. Normal gallbladder.There is no biliary ductal dilation. Pancreas: Normal contours without ductal dilatation. No peripancreatic fluid collection. Spleen: The spleen is significantly enlarged measuring approximately 19.4 cm craniocaudad. Adrenals/Urinary Tract: --Adrenal glands: Unremarkable. --Right kidney/ureter: There is a low-attenuation 2.1 cm structure in the right kidney measuring approximately 21 Hounsfield units. There is no hydronephrosis. --Left kidney/ureter: No hydronephrosis or radiopaque kidney stones. --Urinary bladder: Unremarkable. Stomach/Bowel: --Stomach/Duodenum: No hiatal hernia or other gastric abnormality. Normal duodenal course and caliber. --Small bowel: Unremarkable. --Colon: There are few scattered colonic diverticula without CT evidence for diverticulitis. --Appendix: Normal. Vascular/Lymphatic: Atherosclerotic calcification is present within the non-aneurysmal abdominal aorta, without  hemodynamically significant stenosis. --No retroperitoneal lymphadenopathy. --No mesenteric lymphadenopathy. --there is a pathologically enlarged left inguinal lymph node measuring approximately 2.5 cm (axial series 3, image 124). Reproductive: The prostate gland is enlarged. Other: No ascites or free air. The abdominal wall is normal. Musculoskeletal. No acute displaced fractures. IMPRESSION: 1. Pathologically enlarged lymph nodes in the right axilla and left groin in addition to hepatosplenomegaly is concerning for a lymphoproliferative disorder such as lymphoma. The lymph nodes in the right axilla and left groin are readily amenable to percutaneous biopsy. 2. Hepatic steatosis. 3. Indeterminate 2.1 cm nodule in the right kidney. This is favored to represent  a proteinaceous or hemorrhagic cyst. Follow-up with a nonemergent renal ultrasound as an outpatient is recommended for confirmation. 4. Multiple indeterminate pulmonary nodules are noted measuring up to approximately 6 mm. Follow-up non-contrast CT recommended at 3-6 months to confirm persistence. If unchanged, and solid component remains <6 mm, annual CT is recommended until 5 years of stability has been established. If persistent these nodules should be considered highly suspicious if the solid component of the nodule is 6 mm or greater in size and enlarging. This recommendation follows the consensus statement: Guidelines for Management of Incidental Pulmonary Nodules Detected on CT Images: From the Fleischner Society 2017; Radiology 2017; 284:228-243. 5. There is a subtle 2.2 cm ground-glass opacity in the right upper lobe as detailed above. This is favored to represent an infectious or inflammatory process. Attention on follow-up CT is recommended. Aortic Atherosclerosis (ICD10-I70.0). Electronically Signed   By: Constance Holster M.D.   On: 01/01/2020 20:51   DG Chest Portable 1 View  Result Date: 01/01/2020 CLINICAL DATA:  Cough EXAM: PORTABLE CHEST 1 VIEW COMPARISON:  December 15, 2019 FINDINGS: The cardiomediastinal silhouette is unchanged in contour.RIGHT rounded retrocardiac opacity may reflect a hiatal hernia. No pleural effusion. No pneumothorax. No acute pleuroparenchymal abnormality. Visualized abdomen is unremarkable. Multilevel degenerative changes of the thoracic spine. IMPRESSION: No acute cardiopulmonary abnormality. Electronically Signed   By: Valentino Saxon MD   On: 01/01/2020 16:32   DG Chest Port 1 View  Result Date: 12/15/2019 CLINICAL DATA:  Sepsis EXAM: PORTABLE CHEST 1 VIEW COMPARISON:  None. FINDINGS: The heart size and mediastinal contours are within normal limits. No focal airspace consolidation, pleural effusion, or pneumothorax. The visualized skeletal structures are  unremarkable. IMPRESSION: No active disease. Electronically Signed   By: Davina Poke D.O.   On: 12/15/2019 14:03   EEG adult  Result Date: 01/03/2020 Lora Havens, MD     01/03/2020  2:23 PM Patient Name: Kareem Cathey MRN: [id number] Epilepsy Attending: Lora Havens Referring Physician/Provider: Dr. Charlynne Cousins Date: 01/03/2020 Duration: 26.20 mins Patient history: 59 year old male with worsening encephalopathy which has progressed over the last 6 to 7 weeks.  EEG to evaluate for seizures. Level of alertness: Awake, asleep AEDs during EEG study: None Technical aspects: This EEG study was done with scalp electrodes positioned according to the 10-20 International system of electrode placement. Electrical activity was acquired at a sampling rate of 500Hz  and reviewed with a high frequency filter of 70Hz  and a low frequency filter of 1Hz . EEG data were recorded continuously and digitally stored. Description: No clear posterior dominant rhythm was seen.  EEG showed continued generalized polymorphic 3 to 6 Hz theta-delta slowing.  Hyperventilation and photic stimulation were not performed.   ABNORMALITY -Continuous slow, generalized IMPRESSION: This study is suggestive of moderate diffuse encephalopathy, nonspecific to etiology.  No seizures or epileptiform discharges were seen  throughout the recording. Lora Havens   US Abdomen Limited RUQ  Result Date: 12/15/2019 CLINICAL DATA:  Transaminitis EXAM: ULTRASOUND ABDOMEN LIMITED RIGHT UPPER QUADRANT COMPARISON:  None. FINDINGS: Gallbladder: No gallstones or wall thickening visualized. No sonographic Murphy sign noted by sonographer. Common bile duct: Diameter: 4 mm Liver: The liver is enlarged measuring 18.7 cm in the midclavicular line. There is diffuse increased liver echotexture consistent with hepatic steatosis. No focal liver abnormality. Portal vein is patent on color Doppler imaging with normal direction of blood flow towards the  liver. Other: Right kidney is unremarkable measuring 12.4 cm. No free fluid. IMPRESSION: 1. Hepatomegaly, with diffuse hepatic steatosis. 2. Otherwise unremarkable exam. Electronically Signed   By: Randa Ngo M.D.   On: 12/15/2019 18:25   Assessment and Plan:  1) lymphadenopathy of the left neck, right axilla, left groin, and hepatosplenomegaly concerning for lymphoproliferative disorder such as lymphoma -01/01/2020 CT neck-"1. Extensive left-sided adenopathy without a primary lesion. This is concerning for malignancy. This may represent metastatic disease of unknown primary versus lymphoma or leukemia. 2. No primary lesion is identified. 3. Degenerative changes of the cervical spine are most evident C5-6 and C6-7." -01/01/2020 CT chest/abdomen/pelvis-"1. Pathologically enlarged lymph nodes in the right axilla and left groin in addition to hepatosplenomegaly is concerning for a lymphoproliferative disorder such as lymphoma. The lymph nodes in the right axilla and left groin are readily amenable to percutaneous biopsy. 2. Hepatic steatosis. 3. Indeterminate 2.1 cm nodule in the right kidney. This is favored to represent a proteinaceous or hemorrhagic cyst. Follow-up with a nonemergent renal ultrasound as an outpatient is recommended for confirmation. 4. Multiple indeterminate pulmonary nodules are noted measuring up to approximately 6 mm. Follow-up non-contrast CT recommended at 3-6 months to confirm persistence. If unchanged, and solid component remains <6 mm, annual CT is recommended until 5 years of stability has been established. If persistent these nodules should be considered highly suspicious if the solid component of the nodule is 6 mm or greater in size and enlarging. This recommendation follows the consensus statement: Guidelines for Management of Incidental Pulmonary Nodules Detected on CT Images: From the Fleischner Society 2017; Radiology 2017; 284:228-243. 5. There is a subtle 2.2 cm ground-glass  opacity in the right upper lobe as detailed above. This is favored to represent an infectious or inflammatory process. Attention on follow-up CT is recommended." -01/01/2020 [facility] 282 -01/04/2020-ultrasound-guided biopsy of right axillary lymph node-pathology pending  2) normocytic anemia -CBC from 01/01/2020 showed a hemoglobin of 7.8 hematocrit 26.7.  MCV was normal at 85.9. -01/01/2020-ferritin 3097, iron 12, TIBC 193, percent saturation 6%, vitamin J94 174, folic acid 6.2 -Likely due to underlying malignancy  3) altered mental status/acute metabolic encephalopathy -11/05/4816 MRI brain- "1. 7 punctate foci of restricted diffusion seen scattered within the cortical and subcortical brain of both hemispheres consistent with small acute infarctions. Other areas indistinct T2 and FLAIR signal affecting the cortical and subcortical brain, separate from those areas of small restricted diffusion. This process is most notable at the left frontal vertex and in the left lateral temporal lobe. The differential diagnosis is that of ischemic disease versus demyelinating disease versus autoimmune cerebritis versus infectious cerebritis versus vasculitis."  4) transaminitis -Unclear etiology,?  Infiltrative involvement of liver by/lymphoproliferative disorder  PLAN: -Await biopsy of the right axillary lymph node.  Further recommendations pending these results. -Recommend continued monitoring of hemoglobin very closely.  Transfuse for hemoglobin less than 7. -Neurology following for encephalopathy.  They recommend additional work-up including  MRI, MRV, MRA, echocardiogram, thiamine replacement, and LP with CSF studies.  Thank you for this referral.   Mikey Bussing, DNP, AGPCNP-BC, AOCNP  ADDENDUM  .Patient was Personally and independently interviewed, examined and relevant elements of the history of present illness were reviewed in details and an assessment and plan was created. All elements of the  patient's history of present illness , assessment and plan were discussed in details with Mikey Bussing, DNP, AGPCNP-BC, AOCNP. The above documentation reflects our combined findings assessment and plan.  Patient unable to provide any relevant information on account of AMS-- most information obtained from chart. -consider transfusion for Hgb<8 in the setting of AMS -awaiting results of LN biopsy results -AMS w/u brain imaging and LP per neurology.  Sullivan Lone MD MS

## 2020-01-04 NOTE — Progress Notes (Signed)
   01/04/20 1615  Assess: MEWS Score  Temp 99.5 F (37.5 C)  BP 107/73  Pulse Rate (!) 116  ECG Heart Rate (!) 117  Resp (!) 40  SpO2 100 %  O2 Device Nasal Cannula  O2 Flow Rate (L/min) 2 L/min  Assess: MEWS Score  MEWS Temp 0  MEWS Systolic 0  MEWS Pulse 2  MEWS RR 3  MEWS LOC 0  MEWS Score 5  MEWS Score Color Red  Assess: if the MEWS score is Yellow or Red  Were vital signs taken at a resting state? Yes  Focused Assessment No change from prior assessment  Early Detection of Sepsis Score *See Row Information* Low  MEWS guidelines implemented *See Row Information* No, previously red, continue vital signs every 4 hours  Treat  MEWS Interventions Administered prn meds/treatments;Escalated (See documentation below)  Pain Scale 0-10  Pain Score 0  Take Vital Signs  Increase Vital Sign Frequency  Red: Q 1hr X 4 then Q 4hr X 4, if remains red, continue Q 4hrs  Escalate  MEWS: Escalate Red: discuss with charge nurse/RN and provider, consider discussing with RRT  Notify: Charge Nurse/RN  Name of Charge Nurse/RN Notified Beverlee Nims RN  Date Charge Nurse/RN Notified 02/04/20  Time Charge Nurse/RN Notified 1615  Notify: Provider  Provider Name/Title Olevia Bowens  Date Provider Notified 01/04/20  Time Provider Notified 1615  Notification Type Call  Notification Reason Other (Comment)  Response No new orders (PRN med given)  Date of Provider Response 01/04/20  Time of Provider Response 1615  Notify: Rapid Response  Name of Rapid Response RN Notified Shelby  Date Rapid Response Notified 03/05/20  Time Rapid Response Notified 7017  Document  Patient Outcome Stabilized after interventions  Progress note created (see row info) Yes

## 2020-01-04 NOTE — Progress Notes (Signed)
[facility] PROGRESS NOTE    Progress Note  Brian Ochoa  QQV:[id number] DOB: 1961/01/06 DOA: 01/01/2020 PCP: Aletha Halim., PA-C     Brief Narrative:   Brian Ochoa is an 59 y.o. male past medical history significant of TIA with a recent admission for encephalopathy discharged on 12/22/2019 presents again with altered mental status during previous hospitalization during her previous hospitalization she was found to be febrile treated for infectious etiology and encephalopathy resolved.  Of admission he became more confused so she called EMS on arrival she was found to have a blood pressure of 96/60 was given a bolus the wife also noted some lymphadenopathy in his neck showed Extensive left-sided adenopathy without a primary lesion.  CT of the abdomen pelvis showed extensive lymphadenopathy in the right axilla and left groin. IR was consulted and is planning for biopsy on 01/04/2020.  Also his MRI of the brain showed 2 small what appeared to be stroke percent T2 flaring in the left frontal and temporal lobe, neurology was consulted who recommended MRI of the brain with contrast MRV and EEG.  Assessment/Plan:   Acute metabolic encephalopathy Work-up negative for sepsis when speaking to the family they said he never quite return to his baseline cognition. MRI of the brain 01/02/2020 showed 7 mm indurated foci in both hemispheres concerning for small acute infarct, there is T2 flair signal in the left frontal and temporal lobes. Neurology was consulted recommended an MRI of the brain with contrast MRV are pending as he will require sedation with anesthesia and EEG moderate diffuse encephalopathy nonspecific.  Lymphadenopathy of the head and neck: Concerning for malignancy (lymphoma/leukemia).   CT of the abdomen and pelvis done on 01/01/2020 there is extensive enlarged lymph nodes in the right axilla, left groin hepatosplenomegaly IR was consulted and is plan for lymph node biopsy  on 01/04/2020 Used to smoke is up-to-date on his colonoscopies.  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 Ferritin of 3000 TIBC of 193 iron of 12.  B12 465. Hemoglobin this morning 7.2 we will continue to monitor intermittently.  Elevated LFTs: More likely due to hematological malignancy. On previous admission it was suspected drug-induced. Appreciate GIs assistance will defer liver biopsy elevation in LFTs likely due to lymphoproliferative disorder.   DVT prophylaxis: lovenox Family Communication:son Status is: Inpatient  Remains inpatient appropriate because:Hemodynamically unstable   Dispo: The patient is from: Home              Anticipated d/c is to: Home              Anticipated d/c date is: > 3 days              Patient currently is not medically stable to d/c.        Code Status:     Code Status Orders  (From admission, onward)         Start     Ordered   01/01/20 2012  Full code  Continuous        01/01/20 2016        Code Status History    Date Active Date Inactive Code Status Order ID Comments User Context   12/15/2019 2332 12/22/2019 2100 Full Code [id number]  [name], MD ED   Advance Care Planning Activity        IV Access:    Peripheral IV   Procedures and diagnostic studies:   MR BRAIN WO CONTRAST  Result Date: 01/02/2020 CLINICAL DATA:  Delirium.  Confusion.  Mental status changes. EXAM: MRI HEAD WITHOUT CONTRAST TECHNIQUE: Multiplanar, multiecho pulse sequences of the brain and surrounding structures were obtained without intravenous contrast. COMPARISON:  Head CT 01/01/2020.  MRI 12/16/2019. FINDINGS: Brain: 7 punctate foci restricted diffusion seen scattered within the cortical and subcortical brain of both hemispheres consistent with small infarctions. No large confluent area of restricted diffusion. Within both cerebral hemispheres, more extensive on the left than the right, there are areas of abnormal  T2 and FLAIR signal affecting the cortical and subcortical brain. This is most notable at the left frontal vertex and in the left lateral temporal lobe. The last study was motion degraded and abbreviated, but the FLAIR and T2 imaging is of reasonable quality in those changes do not appear to have been present at that time. There is no evidence of mass, hemorrhage, hydrocephalus or extra-axial collection. The differential diagnosis is that of sequela of ischemic disease versus is demyelinating disease versus autoimmune disease or vasculitis. No sign of hemorrhage, hydrocephalus or extra-axial collection. Small focus of hemosiderin in the left parietal cortical and subcortical brain is 1 exception to that but is of uncertain chronicity. I think one can appreciate some susceptibility effect in this location on the diffusion imaging late September. Vascular: Major vessels at the base of the brain show flow. Skull and upper cervical spine: Negative Sinuses/Orbits: Sinuses are clear.  Orbits negative. Other: Small mastoid effusions. IMPRESSION: 1. 7 punctate foci of restricted diffusion seen scattered within the cortical and subcortical brain of both hemispheres consistent with small acute infarctions. Other areas indistinct T2 and FLAIR signal affecting the cortical and subcortical brain, separate from those areas of small restricted diffusion. This process is most notable at the left frontal vertex and in the left lateral temporal lobe. The differential diagnosis is that of ischemic disease versus demyelinating disease versus autoimmune cerebritis versus infectious cerebritis versus vasculitis. Electronically Signed   By: Nelson Chimes M.D.   On: 01/02/2020 09:39   EEG adult  Result Date: 01/03/2020 Lora Havens, MD     01/03/2020  2:23 PM Patient Name: Brian Ochoa MRN: [id number] Epilepsy Attending: Lora Havens Referring Physician/Provider: Dr. Charlynne Cousins Date: 01/03/2020 Duration: 26.20 mins  Patient history: 59 year old male with worsening encephalopathy which has progressed over the last 6 to 7 weeks.  EEG to evaluate for seizures. Level of alertness: Awake, asleep AEDs during EEG study: None Technical aspects: This EEG study was done with scalp electrodes positioned according to the 10-20 International system of electrode placement. Electrical activity was acquired at a sampling rate of 500Hz  and reviewed with a high frequency filter of 70Hz  and a low frequency filter of 1Hz . EEG data were recorded continuously and digitally stored. Description: No clear posterior dominant rhythm was seen.  EEG showed continued generalized polymorphic 3 to 6 Hz theta-delta slowing.  Hyperventilation and photic stimulation were not performed.   ABNORMALITY -Continuous slow, generalized IMPRESSION: This study is suggestive of moderate diffuse encephalopathy, nonspecific to etiology.  No seizures or epileptiform discharges were seen throughout the recording. [place] [facility]:    None.  Anti-Infectives:  None  Subjective:    Maryella Shivers sleeping this morning.  Objective:    Vitals:   01/04/20 0356 01/04/20 0400 01/04/20 0635 01/04/20 0837  BP: 100/68  (!) 91/57 127/74  Pulse: (!) 110  (!) 116 92  Resp:  (!) 22 20 16   Temp:  99.2 F (37.3 C) 98.6  F (37 C) 98.2 F (36.8 C)  TempSrc:   Oral Oral  SpO2:  96% 96% 93%  Weight:  78 kg    Height:       SpO2: 93 % O2 Flow Rate (L/min): 2 L/min   Intake/Output Summary (Last 24 hours) at 01/04/2020 0907 Last data filed at 01/04/2020 4166 Gross per 24 hour  Intake 0 ml  Output 600 ml  Net -600 ml   Filed Weights   01/02/20 0400 01/03/20 0552 01/04/20 0400  Weight: 80.7 kg 80.7 kg 78 kg    Exam: General exam: In no acute distress. Respiratory system: Good air movement and clear to auscultation. Cardiovascular system: S1 & S2 heard, RRR. No JVD. Gastrointestinal system: Abdomen is nondistended, soft and  nontender.  Extremities: No pedal edema. Skin: No rashes, lesions or ulcers   Data Reviewed:    Labs: Basic Metabolic Panel: Recent Labs  Lab 12/30/19 1615 12/30/19 1615 01/01/20 1538 01/01/20 1538 01/02/20 0250 01/03/20 2133  NA 131*  --  134*  --  134* 133*  K 4.0   < > 3.7   < > 3.7 4.0  CL 93*  --  99  --  99 99  CO2 26  --  25  --  25 24  GLUCOSE 127*  --  138*  --  98 112*  BUN 13  --  14  --  13 9  CREATININE 0.76  --  0.88  --  0.76 0.80  CALCIUM 8.4*  --  7.9*  --  8.1* 8.0*  PHOS  --   --   --   --   --  5.3*   < > = values in this interval not displayed.   GFR Estimated Creatinine Clearance: 108.5 mL/min (by C-G formula based on SCr of 0.8 mg/dL). Liver Function Tests: Recent Labs  Lab 12/30/19 1615 01/01/20 1538 01/02/20 0250 01/03/20 2133  AST 132* 134* 106*  --   ALT 281* 209* 193*  --   ALKPHOS  --  289* 243*  --   BILITOT 1.3* 0.7 0.9  --   PROT 5.9* 5.6* 5.7*  --   ALBUMIN  --  1.7* 1.7* 1.6*   No results for input(s): LIPASE, AMYLASE in the last 168 hours. Recent Labs  Lab 01/01/20 1540 01/03/20 2211  AMMONIA 29 26   Coagulation profile Recent Labs  Lab 01/01/20 1538 01/02/20 0250 01/03/20 2129  INR 1.3* 1.2 1.3*   COVID-19 Labs  Recent Labs    01/01/20 2247  FERRITIN 3,097*  LDH 282*    Lab Results  Component Value Date   SARSCOV2NAA NEGATIVE 01/01/2020   [place] NEGATIVE 12/15/2019    CBC: Recent Labs  Lab 12/30/19 1615 01/01/20 1538 01/01/20 2247 01/02/20 0250 01/03/20 2129  WBC 10.0 7.7 5.1 7.1 8.0  NEUTROABS 8,010* 6.1  --   --   --   HGB 9.7* 7.8* 7.2* 8.2* 7.2*  HCT 29.3* 26.7* 24.0* 28.1* 24.4*  MCV 77.3* 85.9 84.8 84.4 82.2  PLT 329 252 196 242 248   Cardiac Enzymes: No results for input(s): CKTOTAL, CKMB, CKMBINDEX, TROPONINI in the last 168 hours. BNP (last 3 results) No results for input(s): PROBNP in the last 8760 hours. CBG: Recent Labs  Lab 01/01/20 1516  GLUCAP 148*   D-Dimer: No  results for input(s): DDIMER in the last 72 hours. Hgb A1c: No results for input(s): HGBA1C in the last 72 hours. Lipid Profile: No results for input(s): CHOL,  HDL, LDLCALC, TRIG, CHOLHDL, LDLDIRECT in the last 72 hours. Thyroid function studies: Recent Labs    01/02/20 0653  TSH 0.351   Anemia work up: Recent Labs    01/01/20 2247  VITAMINB12 465  FOLATE 6.2  FERRITIN 3,097*  TIBC 193*  IRON 12*   Sepsis Labs: Recent Labs  Lab 01/01/20 1538 01/01/20 1541 01/01/20 2247 01/02/20 0250 01/03/20 2129  WBC 7.7  --  5.1 7.1 8.0  LATICACIDVEN  --  1.5 1.0  --   --    Microbiology Recent Results (from the past 240 hour(s))  Culture, blood (routine x 2)     Status: None (Preliminary result)   Collection Time: 01/01/20  3:25 PM   Specimen: BLOOD RIGHT FOREARM  Result Value Ref Range Status   Specimen Description BLOOD RIGHT FOREARM  Final   Special Requests NONE  Final   Culture   Final    NO GROWTH 3 DAYS Performed at [facility] [address] [place], [place] [address]    Report Status PENDING  Incomplete  Urine culture     Status: None   Collection Time: 01/01/20  3:25 PM   Specimen: Urine, Random  Result Value Ref Range Status   Specimen Description URINE, RANDOM  Final   Special Requests NONE  Final   Culture   Final    NO GROWTH Performed at [facility], [place] [address], [place], [place] [address]    Report Status 01/02/2020 FINAL  Final  Respiratory Panel by RT PCR (Flu A&B, Covid) - Nasopharyngeal Swab     Status: None   Collection Time: 01/01/20  3:38 PM   Specimen: Nasopharyngeal Swab  Result Value Ref Range Status   SARS Coronavirus 2 by RT PCR NEGATIVE NEGATIVE Final    Comment: (NOTE) SARS-CoV-2 target nucleic acids are NOT DETECTED.  The SARS-CoV-2 RNA is generally detectable in upper respiratoy specimens during the acute phase of infection. The lowest concentration of SARS-CoV-2 viral copies this assay can detect is 131  copies/mL. A negative result does not preclude SARS-Cov-2 infection and should not be used as the sole basis for treatment or other patient management decisions. A negative result may occur with  improper specimen collection/handling, submission of specimen other than nasopharyngeal swab, presence of viral mutation(s) within the areas targeted by this assay, and inadequate number of viral copies (<131 copies/mL). A negative result must be combined with clinical observations, patient history, and epidemiological information. The expected result is Negative.  Fact Sheet for Patients:  PinkCheek.be  Fact Sheet for Healthcare Providers:  GravelBags.it  This test is no t yet approved or cleared by the [place] FDA and  has been authorized for detection and/or diagnosis of SARS-CoV-2 by FDA under an Emergency Use Authorization (EUA). This EUA will remain  in effect (meaning this test can be used) for the duration of the COVID-19 declaration under Section 564(b)(1) of the Act, 21 U.S.C. section 360bbb-3(b)(1), unless the authorization is terminated or revoked sooner.     Influenza A by PCR NEGATIVE NEGATIVE Final   Influenza B by PCR NEGATIVE NEGATIVE Final    Comment: (NOTE) The Xpert Xpress SARS-CoV-2/FLU/RSV assay is intended as an aid in  the diagnosis of influenza from Nasopharyngeal swab specimens and  should not be used as a sole basis for treatment. Nasal washings and  aspirates are unacceptable for Xpert Xpress SARS-CoV-2/FLU/RSV  testing.  Fact Sheet for Patients: PinkCheek.be  Fact Sheet for Healthcare Providers:  GravelBags.it  This test is not yet approved or cleared by the [place] and  has been authorized for detection and/or diagnosis of SARS-CoV-2 by  FDA under an Emergency Use Authorization (EUA). This EUA will remain  in effect (meaning  this test can be used) for the duration of the  Covid-19 declaration under Section 564(b)(1) of the Act, 21  U.S.C. section 360bbb-3(b)(1), unless the authorization is  terminated or revoked. Performed at [facility], [place] [address] [place], [place] [address]   Culture, blood (routine x 2)     Status: None (Preliminary result)   Collection Time: 01/01/20  4:34 PM   Specimen: BLOOD LEFT HAND  Result Value Ref Range Status   Specimen Description BLOOD LEFT HAND  Final   Special Requests   Final    BOTTLES DRAWN AEROBIC AND ANAEROBIC Blood Culture adequate volume   Culture   Final    NO GROWTH 3 DAYS Performed at [facility], [place] [address] [place], [place] [address]    Report Status PENDING  Incomplete     Medications:    haloperidol lactate  5 mg Intravenous Once   multivitamin with minerals  1 tablet Oral Daily   pantoprazole  40 mg Oral QAC breakfast   sodium chloride flush  3 mL Intravenous Q12H   thiamine injection  100 mg Intravenous Daily   Continuous Infusions:  sodium chloride        LOS: 3 days   Charlynne Cousins  [facility]  01/04/2020, 9:07 AM

## 2020-01-04 NOTE — Significant Event (Signed)
Rapid Response Event Note   Reason for Call :  Pt disoriented x4. RR 40s.    Initial Focused Assessment:  Pt alert, oriented to person and correctly states his birthday. Per RN pt has only been oriented to person. Speech is clear. Follows commands, moving all extremities appropriately. Grips equal. PERRLA, 40mm. Lung sounds are clear. Pt noted to be tachypneic, no distress, no accessory muscle use. Abdomen is soft. Skin is pink, warm, dry. Cheeks are mildly flushed. Per RN pt is due to void, bladder scan determined bladder to have 450 cc in it.   VS: T 99.53F, BP 113/69, HR 103, RR 35-40, SpO2 100% on 3LNC  Interventions:  -No intervention from [facility]:  -Oral care per protocol -Elevate HOB -Monitor pt for increased work of breathing, distress, increased supplemental oxygen requirements -Wean supplemental oxygen as pt tolerates -Bladder scan q4h until pt is adequately voiding  Call rapid response for additional needs  Event Summary:  MD Notified: Dr. Aileen Fass Call Time: [place] Time: 1640 End Time: [place], RN

## 2020-01-04 NOTE — Progress Notes (Signed)
Subjective: No changes.  Patient is with his wife and he remains confused.  Objective: Vital signs in last 24 hours: Temp:  [98.6 F (37 C)-99.4 F (37.4 C)] 98.6 F (37 C) (10/12 0635) Pulse Rate:  [104-150] 116 (10/12 0635) Resp:  [20-22] 20 (10/12 0635) BP: (91-116)/(57-71) 91/57 (10/12 0635) SpO2:  [94 %-96 %] 96 % (10/12 0635) Weight:  [78 kg] 78 kg (10/12 0400) Last BM Date: 01/02/20  Intake/Output from previous day: 10/11 0701 - 10/12 0700 In: 0  Out: 600 [Urine:600] Intake/Output this shift: No intake/output data recorded.  General appearance: confused, unsettled GI: soft, non-tender; bowel sounds normal; no masses,  no organomegaly Neck: LAD  Lab Results: Recent Labs    01/01/20 2247 01/02/20 0250 01/03/20 2129  WBC 5.1 7.1 8.0  HGB 7.2* 8.2* 7.2*  HCT 24.0* 28.1* 24.4*  PLT 196 242 248   BMET Recent Labs    01/01/20 1538 01/02/20 0250 01/03/20 2133  NA 134* 134* 133*  K 3.7 3.7 4.0  CL 99 99 99  CO2 25 25 24   GLUCOSE 138* 98 112*  BUN 14 13 9   CREATININE 0.88 0.76 0.80  CALCIUM 7.9* 8.1* 8.0*   LFT Recent Labs    01/02/20 0250 01/02/20 0250 01/03/20 2133  PROT 5.7*  --   --   ALBUMIN 1.7*   < > 1.6*  AST 106*  --   --   ALT 193*  --   --   ALKPHOS 243*  --   --   BILITOT 0.9  --   --    < > = values in this interval not displayed.   PT/INR Recent Labs    01/02/20 0250 01/03/20 2129  LABPROT 14.9 15.4*  INR 1.2 1.3*   Hepatitis Panel No results for input(s): HEPBSAG, HCVAB, HEPAIGM, HEPBIGM in the last 72 hours. C-Diff No results for input(s): CDIFFTOX in the last 72 hours. Fecal Lactopherrin No results for input(s): FECLLACTOFRN in the last 72 hours.  Studies/Results: MR BRAIN WO CONTRAST  Result Date: 01/02/2020 CLINICAL DATA:  Delirium.  Confusion.  Mental status changes. EXAM: MRI HEAD WITHOUT CONTRAST TECHNIQUE: Multiplanar, multiecho pulse sequences of the brain and surrounding structures were obtained without  intravenous contrast. COMPARISON:  Head CT 01/01/2020.  MRI 12/16/2019. FINDINGS: Brain: 7 punctate foci restricted diffusion seen scattered within the cortical and subcortical brain of both hemispheres consistent with small infarctions. No large confluent area of restricted diffusion. Within both cerebral hemispheres, more extensive on the left than the right, there are areas of abnormal T2 and FLAIR signal affecting the cortical and subcortical brain. This is most notable at the left frontal vertex and in the left lateral temporal lobe. The last study was motion degraded and abbreviated, but the FLAIR and T2 imaging is of reasonable quality in those changes do not appear to have been present at that time. There is no evidence of mass, hemorrhage, hydrocephalus or extra-axial collection. The differential diagnosis is that of sequela of ischemic disease versus is demyelinating disease versus autoimmune disease or vasculitis. No sign of hemorrhage, hydrocephalus or extra-axial collection. Small focus of hemosiderin in the left parietal cortical and subcortical brain is 1 exception to that but is of uncertain chronicity. I think one can appreciate some susceptibility effect in this location on the diffusion imaging late September. Vascular: Major vessels at the base of the brain show flow. Skull and upper cervical spine: Negative Sinuses/Orbits: Sinuses are clear.  Orbits negative. Other: Small mastoid effusions.  IMPRESSION: 1. 7 punctate foci of restricted diffusion seen scattered within the cortical and subcortical brain of both hemispheres consistent with small acute infarctions. Other areas indistinct T2 and FLAIR signal affecting the cortical and subcortical brain, separate from those areas of small restricted diffusion. This process is most notable at the left frontal vertex and in the left lateral temporal lobe. The differential diagnosis is that of ischemic disease versus demyelinating disease versus  autoimmune cerebritis versus infectious cerebritis versus vasculitis. Electronically Signed   By: Nelson Chimes M.D.   On: 01/02/2020 09:39   EEG adult  Result Date: 01/03/2020 Lora Havens, MD     01/03/2020  2:23 PM Patient Name: Brian Ochoa MRN: [id number] Epilepsy Attending: Lora Havens Referring Physician/Provider: Dr. Charlynne Cousins Date: 01/03/2020 Duration: 26.20 mins Patient history: 59 year old male with worsening encephalopathy which has progressed over the last 6 to 7 weeks.  EEG to evaluate for seizures. Level of alertness: Awake, asleep AEDs during EEG study: None Technical aspects: This EEG study was done with scalp electrodes positioned according to the 10-20 International system of electrode placement. Electrical activity was acquired at a sampling rate of 500Hz  and reviewed with a high frequency filter of 70Hz  and a low frequency filter of 1Hz . EEG data were recorded continuously and digitally stored. Description: No clear posterior dominant rhythm was seen.  EEG showed continued generalized polymorphic 3 to 6 Hz theta-delta slowing.  Hyperventilation and photic stimulation were not performed.   ABNORMALITY -Continuous slow, generalized IMPRESSION: This study is suggestive of moderate diffuse encephalopathy, nonspecific to etiology.  No seizures or epileptiform discharges were seen throughout the recording. Priyanka Barbra Sarks    Medications:  Scheduled:  haloperidol lactate  5 mg Intravenous Once   multivitamin with minerals  1 tablet Oral Daily   pantoprazole  40 mg Oral QAC breakfast   sodium chloride flush  3 mL Intravenous Q12H   thiamine injection  100 mg Intravenous Daily   Continuous:  sodium chloride      Assessment/Plan: 1) Lymphadenopathy. 2) Metabolic encephalopathy. 3) Anemia. 4) Elevated liver enzymes.   He is scheduled for a lymph node biopsy today.  It maybe difficult as he is confused.  There is suspicion, if he has a lymphoma, that there may  be infiltrative involvement in the liver.  He does demonstrate hepatosplenomegaly.  A liver biopsy may still be needed, but it is best to await the lymph node biopsy results.  The patient also demonstrates an anemia.  His last colonoscopy, which was positive for 3 adenomas, was on 11/13/2012.  In his current condition he is not able to undergo a repeat colonoscopy.  He would most likely not be able to prep well.  Plan: 1) Lymph node biopsy. 2) Monitor HGB.  LOS: 3 days   Shritha Bresee D 01/04/2020, 7:04 AM

## 2020-01-04 NOTE — Progress Notes (Addendum)
HOSPITAL MEDICINE OVERNIGHT EVENT NOTE    Called and informed that patient is exhibiting progressively worsening tachypnea and tachycardia, particularly throughout the day today.  Discussed case with Mindy with rapid response.  Patient is visibly in a degree of respiratory distress.  Patient is mentally not at baseline.  Rapid response has brought up concern about slurring of speech however I believe the patient has other medical issues that may be contributing to his speech and that the slurring of speech is likely not due to an acute stroke (since what was recently identified on recent MRI).  Rapid response is already discussed the case with Dr. Leonel Ramsay with neurology who has voiced the same opinion.  We will obtain CBC, chemistry, troponin, BNP, EKG, chest x-ray, ABG and lactic acid and evaluate further and treat as indicated.  Vernelle Emerald  MD [facility]   ADDENDUM (10/13 12:25AM)  Work-up reveals hemoglobin of 7.0, essentially same as the previous reading of 7.2. Chemistries relatively unremarkable. BNP and troponin are unremarkable. Lactic acid is unremarkable. D-dimer is slightly elevated. ABG still pending. Nursing states that patient is still breathing upwards of 40 times a minute, will send for CT angiogram of the chest.  Vernelle Emerald

## 2020-01-05 ENCOUNTER — Inpatient Hospital Stay (HOSPITAL_COMMUNITY): Payer: Worker's Compensation

## 2020-01-05 LAB — COMPREHENSIVE METABOLIC PANEL
ALT: 98 U/L — ABNORMAL HIGH (ref 0–44)
AST: 50 U/L — ABNORMAL HIGH (ref 15–41)
Albumin: 1.5 g/dL — ABNORMAL LOW (ref 3.5–5.0)
Alkaline Phosphatase: 173 U/L — ABNORMAL HIGH (ref 38–126)
Anion gap: 9 (ref 5–15)
BUN: 17 mg/dL (ref 6–20)
CO2: 25 mmol/L (ref 22–32)
Calcium: 7.9 mg/dL — ABNORMAL LOW (ref 8.9–10.3)
Chloride: 101 mmol/L (ref 98–111)
Creatinine, Ser: 0.82 mg/dL (ref 0.61–1.24)
GFR, Estimated: >60 mL/min (ref >60)
Glucose, Bld: 102 mg/dL — ABNORMAL HIGH (ref 70–99)
Potassium: 3.9 mmol/L (ref 3.5–5.1)
Sodium: 135 mmol/L (ref 135–145)
Total Bilirubin: 1.1 mg/dL (ref 0.3–1.2)
Total Protein: 5 g/dL — ABNORMAL LOW (ref 6.5–8.1)

## 2020-01-05 LAB — RHEUMATOID FACTOR: Rheumatoid fact SerPl-aCnc: 10.1 IU/mL (ref 0.0–13.9)

## 2020-01-05 LAB — BLOOD GAS, ARTERIAL
Acid-Base Excess: 3.4 mmol/L — ABNORMAL HIGH (ref 0.0–2.0)
Bicarbonate: 27.5 mmol/L (ref 20.0–28.0)
Drawn by: 270271
FIO2: 32
O2 Saturation: 96.4 %
Patient temperature: 37.6
pCO2 arterial: 43 mmHg (ref 32.0–48.0)
pH, Arterial: 7.424 (ref 7.350–7.450)
pO2, Arterial: 93 mmHg (ref 83.0–108.0)

## 2020-01-05 LAB — B. BURGDORFI ANTIBODIES: B burgdorferi Ab IgG+IgM: <0.91 {ISR} (ref 0.00–0.90)

## 2020-01-05 LAB — D-DIMER, QUANTITATIVE: D-Dimer, Quant: 0.91 ug/mL-FEU — ABNORMAL HIGH (ref 0.00–0.50)

## 2020-01-05 LAB — TROPONIN I (HIGH SENSITIVITY): Troponin I (High Sensitivity): 10 ng/L (ref <18)

## 2020-01-05 LAB — BRAIN NATRIURETIC PEPTIDE: B Natriuretic Peptide: 96.8 pg/mL (ref 0.0–100.0)

## 2020-01-05 LAB — LACTIC ACID, PLASMA: Lactic Acid, Venous: 0.9 mmol/L (ref 0.5–1.9)

## 2020-01-05 IMAGING — CT CT ANGIO CHEST
2 of 7 series · 18 of 46 positions shown · IV contrast (omnipaque)
Comparison: [DATE] chest CT

CLINICAL DATA: Chest pain and elevated D-dimer

EXAM:
CT ANGIOGRAPHY CHEST WITH CONTRAST
TECHNIQUE: Multidetector CT imaging of the chest was performed using the
standard protocol during bolus administration of intravenous
contrast. Multiplanar CT image reconstructions and MIPs were
obtained to evaluate the vascular anatomy.
CONTRAST:  100mL OMNIPAQUE IOHEXOL 350 MG/ML SOLN

[Series 6: thins · axial · 0.82mm/px · z∈[+1082,+1334]mm · 15 of 281 slices shown]
[im 15/281  lung]
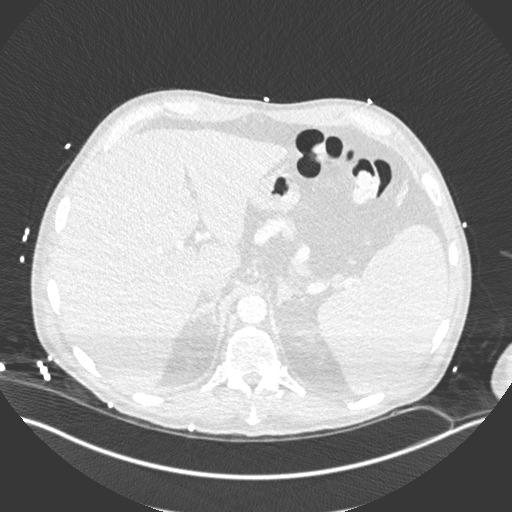
[im 30/281  soft-tissue]
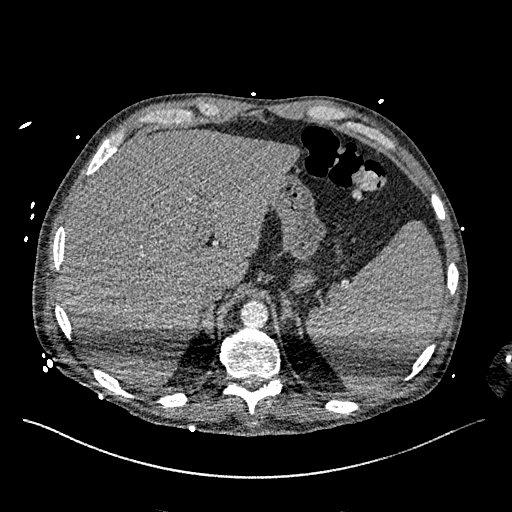
[im 59/281  lung]
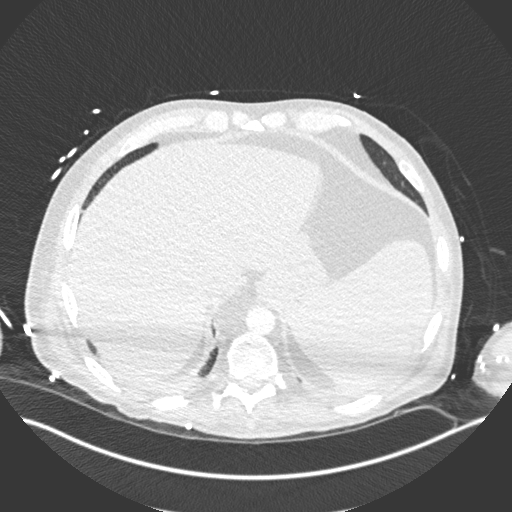
[im 74/281  soft-tissue]
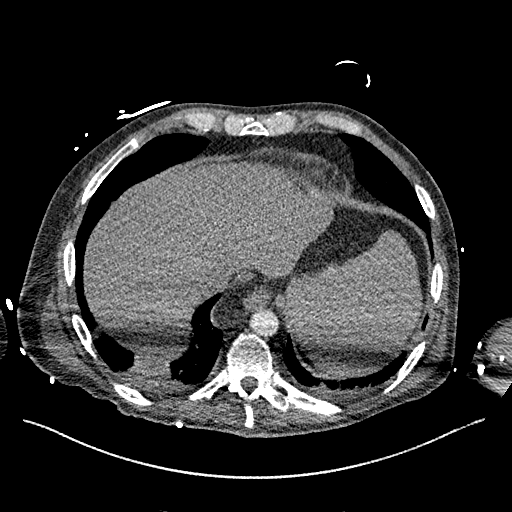
[im 89/281  lung]
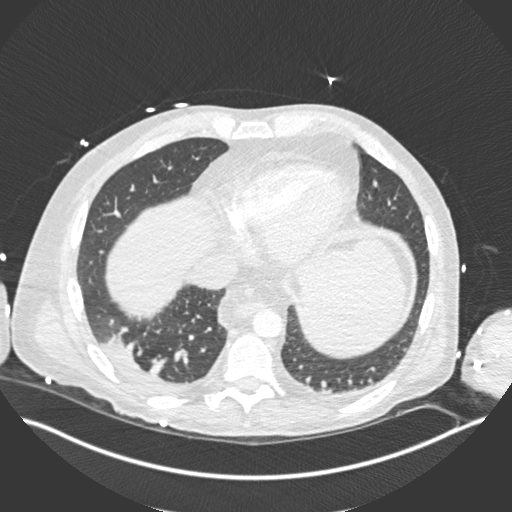
[im 104/281  soft-tissue]
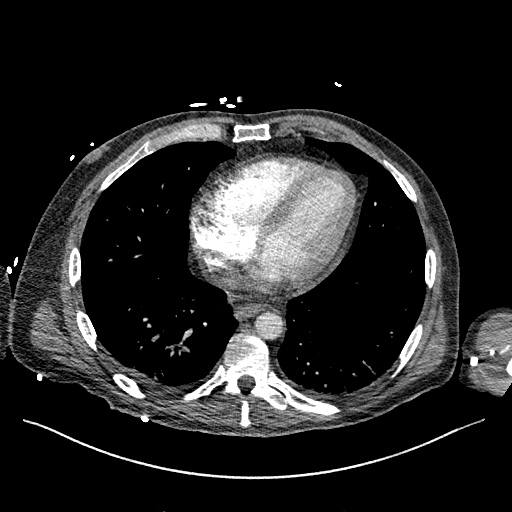
[im 118/281  lung]
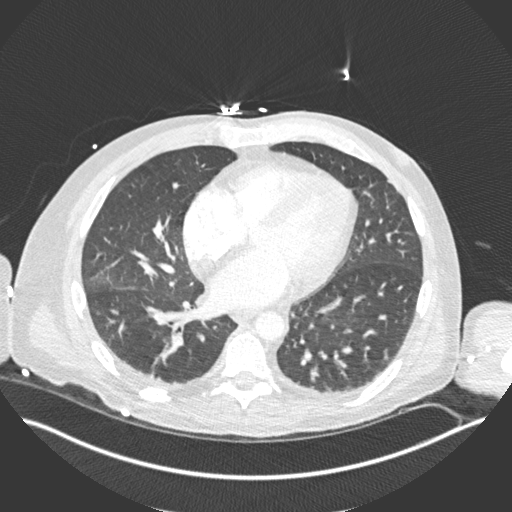
[im 148/281  soft-tissue]
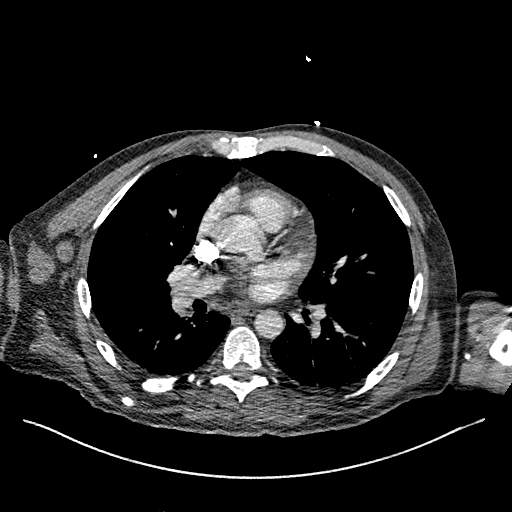
[im 163/281  lung]
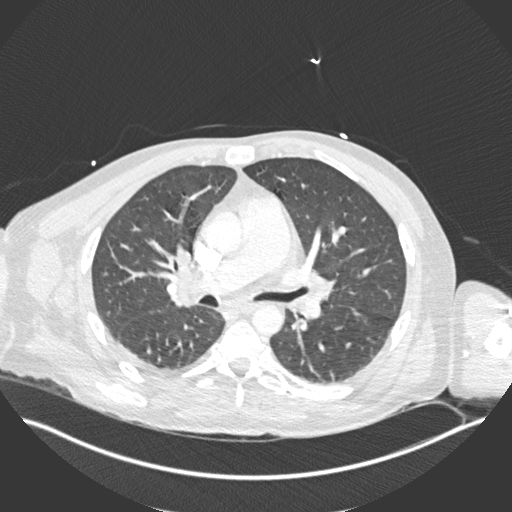
[im 177/281  soft-tissue]
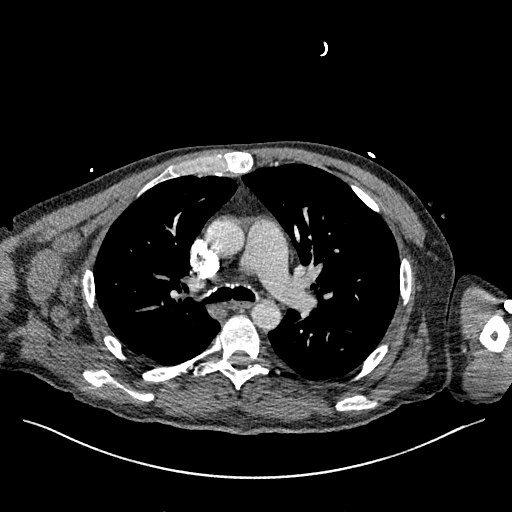
[im 192/281  lung]
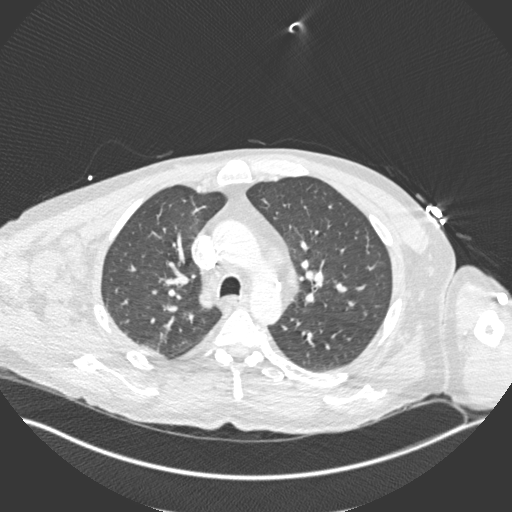
[im 207/281  soft-tissue]
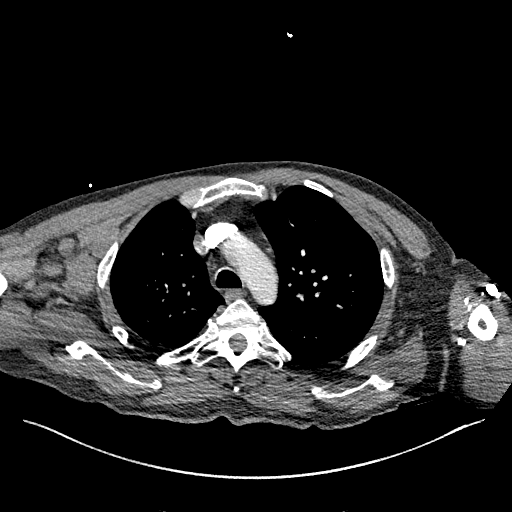
[im 236/281  lung]
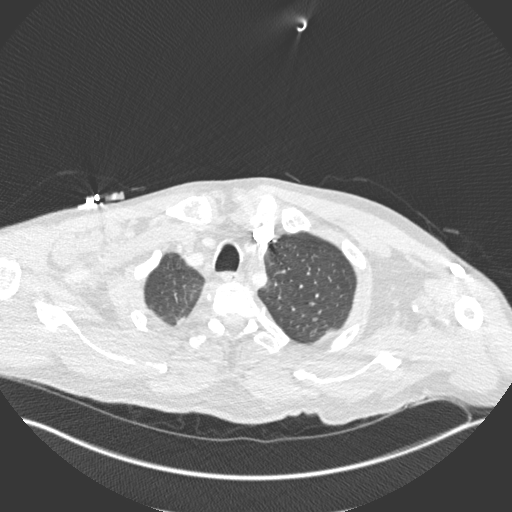
[im 251/281  soft-tissue]
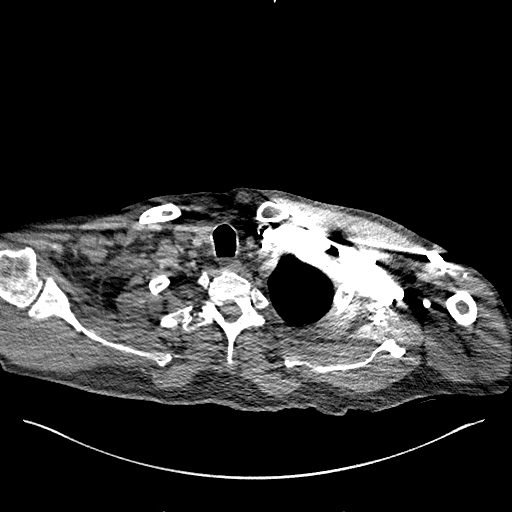
[im 266/281  lung]
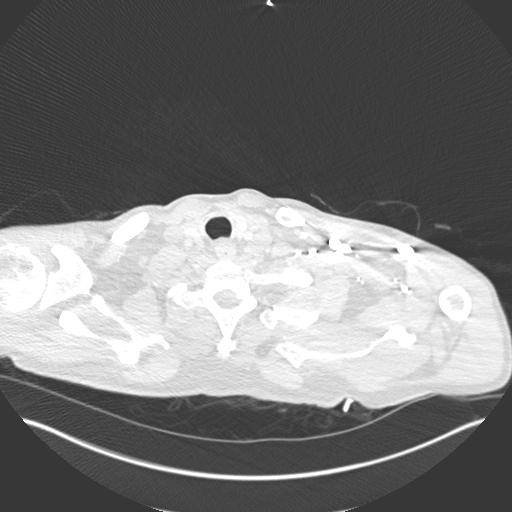

[Series 7: coronal mpr · coronal · 0.59mm/px · 3 of 142 slices shown]
[im 36/142  soft-tissue]
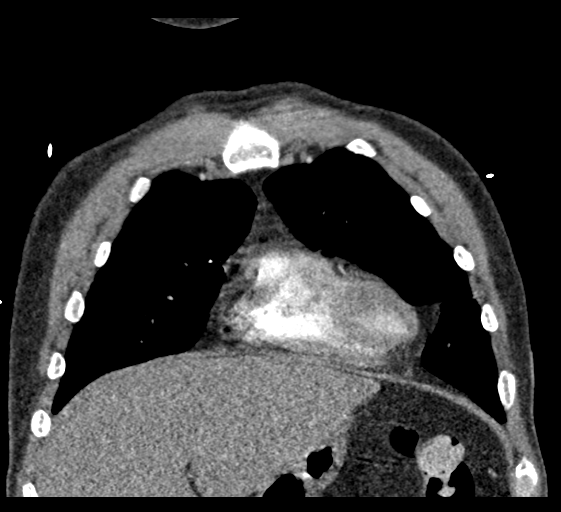
[im 71/142  soft-tissue]
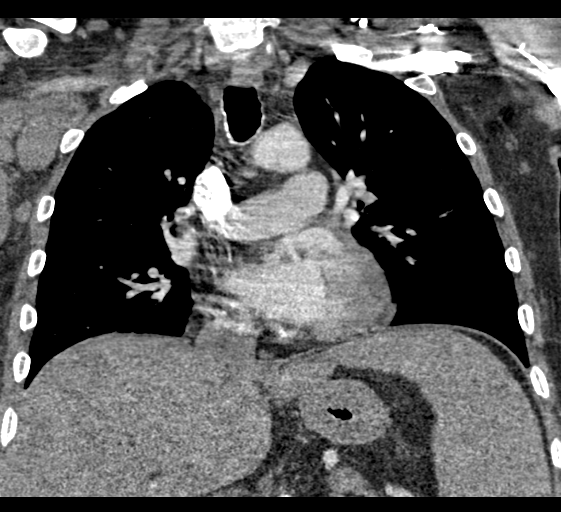
[im 106/142  soft-tissue]
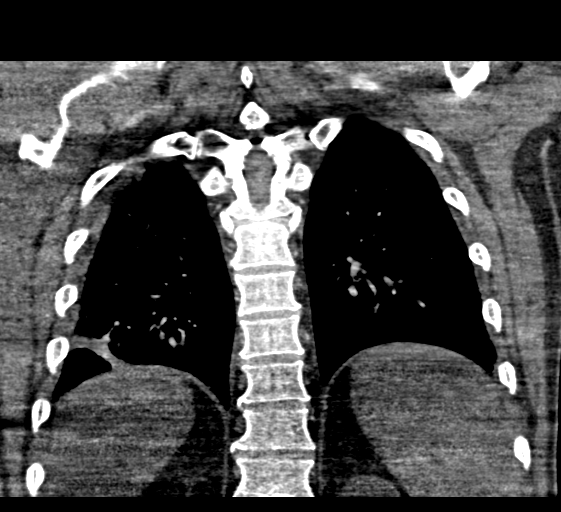

[18 of 46 positions shown; findings below may reference images not displayed]

FINDINGS: Cardiovascular: Suboptimal opacification of the pulmonary arteries
due to bolus timing, limiting evaluation assessment for pulmonary
embolus. Timing was inhibited by patient difficulty following the
technologist's instructions. The size of the main pulmonary artery
is enlarged, measuring 3.6 cm. Heart size is normal, with no
pericardial effusion. The course and caliber of the aorta are
normal. There is no atherosclerotic calcification. Opacification
decreased due to pulmonary arterial phase contrast bolus timing.

Mediastinum/Nodes: Multiple enlarged right axillary lymph nodes
measuring up to 2.6 cm.

Lungs/Pleura: There are multiple small nodules in the right lung,
the largest of which measures for mm, unchanged from the recent
chest CT of [DATE] (series 11, image 61). Unchanged area of
ground glass opacity in the posterior right upper lobe. There is
right basilar atelectasis.

Upper Abdomen: Contrast bolus timing is not optimized for evaluation
of the abdominal organs. Hepatosplenomegaly

Musculoskeletal: No chest wall abnormality. No bony spinal canal
stenosis.

Review of the MIP images confirms the above findings.
IMPRESSION: 1. Suboptimal opacification of the pulmonary arteries due to bolus
timing, limiting evaluation for pulmonary embolus.
2. Multiple enlarged right axillary lymph nodes, measuring up to
cm. This may indicate lymphoproliferative disease such as lymphoma,
or reaction to a process in the right upper extremity.
3. Enlarged main pulmonary artery, consistent with pulmonary
hypertension.
4. Unchanged appearance of ground glass opacity in the posterior
right upper lobe, nonspecific.

## 2020-01-05 MED ORDER — THIAMINE HCL 100 MG PO TABS: 100.0000 mg | ORAL_TABLET | Freq: Every day | ORAL | Status: DC

## 2020-01-05 MED ORDER — IOHEXOL 350 MG/ML SOLN
100.0000 mL | Freq: Once | INTRAVENOUS | Status: AC | PRN
  Administered 2020-01-05: 100 mL via INTRAVENOUS

## 2020-01-05 MED ORDER — SODIUM CHLORIDE 0.9 % IV SOLN: INTRAVENOUS | Status: DC

## 2020-01-05 MED ORDER — METOPROLOL TARTRATE 25 MG PO TABS
25.0000 mg | ORAL_TABLET | Freq: Two times a day (BID) | ORAL | Status: DC
  Administered 2020-01-05: 25 mg via ORAL
  Filled 2020-01-05 (×2): qty 1

## 2020-01-05 NOTE — Progress Notes (Signed)
[facility] PROGRESS NOTE    Progress Note  Brian Ochoa  ZSM:[id number] DOB: 1960-09-25 DOA: 01/01/2020 PCP: Aletha Halim., PA-C     Brief Narrative:   Brian Ochoa is an 59 y.o. male past medical history significant of TIA with a recent admission for encephalopathy discharged on 12/22/2019 presents again with altered mental status during previous hospitalization during her previous hospitalization she was found to be febrile treated for infectious etiology and encephalopathy resolved.  Of admission he became more confused so she called EMS on arrival she was found to have a blood pressure of 96/60 was given a bolus the wife also noted some lymphadenopathy in his neck showed Extensive left-sided adenopathy without a primary lesion.  CT of the abdomen pelvis showed extensive lymphadenopathy in the right axilla and left groin. IR was consulted and is planning for biopsy on 01/04/2020.  Also his MRI of the brain showed 2 small what appeared to be stroke percent T2 flaring in the left frontal and temporal lobe, neurology was consulted who recommended MRI of the brain with contrast MRV and EEG.  Assessment/Plan:   Acute metabolic encephalopathy Work-up negative for sepsis, according to the family he has had a change in mental status for 8-week he has been treated with antibiotics as an outpatient.  Has remained negative till date, SARS-CoV-2 PCR and influenza PCR negative. MRI of the brain 01/02/2020 showed 7 mm indurated foci in both hemispheres concerning for small acute infarct, there is T2 flair signal in the left frontal and temporal lobes. Neurology was consulted recommended an MRI of the brain with contrast MRV are pending as he will require sedation with anesthesia which is still pending.  Neurology also recommended thiamine replacement. EEG moderate diffuse encephalopathy nonspecific. LP and CSF studies.  Lymphadenopathy of the head and neck: Concerning for malignancy  (lymphoma/leukemia).   CT of the abdomen and pelvis done on 01/01/2020 there is extensive enlarged lymph nodes in the right axilla, left groin hepatosplenomegaly IR was consulted lymph node biopsy performed on 01/04/2020. Used to smoke is up-to-date on his colonoscopies. LDH of 282. Awaiting oncology recommendations.  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 Ferritin of 3000 TIBC of 193 iron of 12.  B12 465. Hemoglobin today is 7, tachycardia has resolved.  Recheck tomorrow morning.  Elevated LFTs: More likely due to infiltrated lymphoproliferative disorder.  Increasing respiratory rate: He is status post biopsy he might of aspirated, has no leukocytosis has remained afebrile we will continue to monitor fever curve. Repeated CT angio of the chest showed no PE unchanged groundglass opacities with lymphadenopathy.   DVT prophylaxis: lovenox Family Communication:son Status is: Inpatient  Remains inpatient appropriate because:Hemodynamically unstable   Dispo: The patient is from: Home              Anticipated d/c is to: Home              Anticipated d/c date is: > 3 days              Patient currently is not medically stable to d/c.        Code Status:     Code Status Orders  (From admission, onward)         Start     Ordered   01/01/20 2012  Full code  Continuous        01/01/20 2016        Code Status History    Date Active Date Inactive Code Status Order ID  Comments User Context   12/15/2019 2332 12/22/2019 2100 Full Code [id number]  [name], MD ED   Advance Care Planning Activity        IV Access:    Peripheral IV   Procedures and diagnostic studies:   DG Chest 1 View  Result Date: 01/04/2020 CLINICAL DATA:  59 year old male with altered mental status, respiratory distress. EXAM: CHEST  1 VIEW COMPARISON:  Portable chest and chest CT 01/01/2020 and earlier. FINDINGS: Portable AP semi upright view at 2251 hours. Stable  lung volumes and mediastinal contours. Visualized tracheal air column is within normal limits. Allowing for portable technique the lungs are clear. No pneumothorax or pleural effusion. No acute osseous abnormality identified. IMPRESSION: Negative portable chest. Electronically Signed   By: Genevie Ann M.D.   On: 01/04/2020 23:06   CT ANGIO CHEST PE W OR WO CONTRAST  Result Date: 01/05/2020 CLINICAL DATA:  Chest pain and elevated D-dimer EXAM: CT ANGIOGRAPHY CHEST WITH CONTRAST TECHNIQUE: Multidetector CT imaging of the chest was performed using the standard protocol during bolus administration of intravenous contrast. Multiplanar CT image reconstructions and MIPs were obtained to evaluate the vascular anatomy. CONTRAST:  170mL OMNIPAQUE IOHEXOL 350 MG/ML SOLN COMPARISON:  01/01/2020 chest CT FINDINGS: Cardiovascular: Suboptimal opacification of the pulmonary arteries due to bolus timing, limiting evaluation assessment for pulmonary embolus. Timing was inhibited by patient difficulty following the technologist's instructions. The size of the main pulmonary artery is enlarged, measuring 3.6 cm. Heart size is normal, with no pericardial effusion. The course and caliber of the aorta are normal. There is no atherosclerotic calcification. Opacification decreased due to pulmonary arterial phase contrast bolus timing. Mediastinum/Nodes: Multiple enlarged right axillary lymph nodes measuring up to 2.6 cm. Lungs/Pleura: There are multiple small nodules in the right lung, the largest of which measures for mm, unchanged from the recent chest CT of 01/01/2020 (series 11, image 61). Unchanged area of ground glass opacity in the posterior right upper lobe. There is right basilar atelectasis. Upper Abdomen: Contrast bolus timing is not optimized for evaluation of the abdominal organs. Hepatosplenomegaly Musculoskeletal: No chest wall abnormality. No bony spinal canal stenosis. Review of the MIP images confirms the above findings.  IMPRESSION: 1. Suboptimal opacification of the pulmonary arteries due to bolus timing, limiting evaluation for pulmonary embolus. 2. Multiple enlarged right axillary lymph nodes, measuring up to 2.6 cm. This may indicate lymphoproliferative disease such as lymphoma, or reaction to a process in the right upper extremity. 3. Enlarged main pulmonary artery, consistent with pulmonary hypertension. 4. Unchanged appearance of ground glass opacity in the posterior right upper lobe, nonspecific. Electronically Signed   By: Ulyses Jarred M.D.   On: 01/05/2020 02:55   EEG adult  Result Date: 01/03/2020 Lora Havens, MD     01/03/2020  2:23 PM Patient Name: Willmar Stockinger MRN: [id number] Epilepsy Attending: Lora Havens Referring Physician/Provider: Dr. Charlynne Cousins Date: 01/03/2020 Duration: 26.20 mins Patient history: 59 year old male with worsening encephalopathy which has progressed over the last 6 to 7 weeks.  EEG to evaluate for seizures. Level of alertness: Awake, asleep AEDs during EEG study: None Technical aspects: This EEG study was done with scalp electrodes positioned according to the 10-20 International system of electrode placement. Electrical activity was acquired at a sampling rate of 500Hz  and reviewed with a high frequency filter of 70Hz  and a low frequency filter of 1Hz . EEG data were recorded continuously and digitally stored. Description: No clear posterior dominant rhythm was seen.  EEG showed continued generalized polymorphic 3 to 6 Hz theta-delta slowing.  Hyperventilation and photic stimulation were not performed.   ABNORMALITY -Continuous slow, generalized IMPRESSION: This study is suggestive of moderate diffuse encephalopathy, nonspecific to etiology.  No seizures or epileptiform discharges were seen throughout the recording. Priyanka Barbra Sarks   [place] CORE BIOPSY (LYMPH NODES)  Result Date: 01/04/2020 INDICATION: 59 year old male with lymphadenopathy, concern for lymphoma. EXAM:  ULTRASOUND GUIDED core BIOPSY OF right axillary lymph node MEDICATIONS: None. ANESTHESIA/SEDATION: Fentanyl 50 mcg IV; Versed 1 mg IV Moderate Sedation Time:  15 The patient was continuously monitored during the procedure by the interventional radiology nurse under my direct supervision. PROCEDURE: The procedure, risks, benefits, and alternatives were explained to the patient. Questions regarding the procedure were encouraged and answered. The patient understands and consents to the procedure. The anterior right axilla was prepped with chlorhexidine in a sterile fashion, and a sterile drape was applied covering the operative field. A sterile gown and sterile gloves were used for the procedure. Ultrasound evaluation demonstrated large right axillary lymph node. The procedure was planned. Local anesthesia was provided with 1% Lidocaine at the planned skin entry site as well as deeper along the capsule of the lymph node. A small skin nick was made. A 17 gauge, 10 cm introducer needle was directed under ultrasound guidance into the periphery of the enlarged lymph node. Next, a total of 4 18 gauge core biopsies were obtained. The samples were placed in saline and sent to Pathology. The introducer needle was removed. Postprocedure ultrasound demonstrated no evidence of hematoma or fluid collection in the right axilla. Hemostasis was obtained with manual compression. The patient tolerated the procedure well and was transferred to the floor in stable condition. COMPLICATIONS: None immediate. FINDINGS: Right axillary lymphadenopathy. IMPRESSION: Technically successful ultrasound-guided core biopsy of pathologically enlarged right axillary lymph node. Ruthann Cancer, MD [facility] Electronically Signed   By: Ruthann Cancer MD   On: 01/04/2020 17:25     Medical Consultants:    None.  Anti-Infectives:  None  Subjective:    Geno Sydnor lethargic this  morning  Objective:    Vitals:   01/05/20 0000 01/05/20 0412 01/05/20 0500 01/05/20 0600  BP: 112/74 121/66 (!) 127/100 123/79  Pulse: (!) 114 (!) 105 (!) 121 (!) 117  Resp: (!) 35 (!) 25 (!) 24 (!) 30  Temp:  99.3 F (37.4 C) 99.3 F (37.4 C)   TempSrc:  Oral Oral   SpO2: 100% 100% 100% 100%  Weight:  77.6 kg    Height:       SpO2: 100 % O2 Flow Rate (L/min): 3 L/min   Intake/Output Summary (Last 24 hours) at 01/05/2020 0737 Last data filed at 01/05/2020 0500 Gross per 24 hour  Intake 1361.53 ml  Output 1275 ml  Net 86.53 ml   Filed Weights   01/03/20 0552 01/04/20 0400 01/05/20 0412  Weight: 80.7 kg 78 kg 77.6 kg    Exam: General exam: In no acute distress. Respiratory system: Good air movement and clear to auscultation. Cardiovascular system: S1 & S2 heard, RRR. No JVD. Gastrointestinal system: Abdomen is nondistended, soft and nontender.  Extremities: No pedal edema. Skin: No rashes, lesions or ulcers  Data Reviewed:    Labs: Basic Metabolic Panel: Recent Labs  Lab 12/30/19 1615 12/30/19 1615 01/01/20 1538 01/01/20 1538 01/02/20 0250 01/02/20 0250 01/03/20 2133 01/04/20 2326  NA 131*  --  134*  --  134*  --  133* 135  K 4.0   < > 3.7   < > 3.7   < > 4.0 3.9  CL 93*  --  99  --  99  --  99 101  CO2 26  --  25  --  25  --  24 25  GLUCOSE 127*  --  138*  --  98  --  112* 102*  BUN 13  --  14  --  13  --  9 17  CREATININE 0.76  --  0.88  --  0.76  --  0.80 0.82  CALCIUM 8.4*  --  7.9*  --  8.1*  --  8.0* 7.9*  PHOS  --   --   --   --   --   --  5.3*  --    < > = values in this interval not displayed.   GFR Estimated Creatinine Clearance: 105.9 mL/min (by C-G formula based on SCr of 0.82 mg/dL). Liver Function Tests: Recent Labs  Lab 12/30/19 1615 01/01/20 1538 01/02/20 0250 01/03/20 2133 01/04/20 2326  AST 132* 134* 106*  --  50*  ALT 281* 209* 193*  --  98*  ALKPHOS  --  289* 243*  --  173*  BILITOT 1.3* 0.7 0.9  --  1.1  PROT 5.9*  5.6* 5.7*  --  5.0*  ALBUMIN  --  1.7* 1.7* 1.6* 1.5*   No results for input(s): LIPASE, AMYLASE in the last 168 hours. Recent Labs  Lab 01/01/20 1540 01/03/20 2211  AMMONIA 29 26   Coagulation profile Recent Labs  Lab 01/01/20 1538 01/02/20 0250 01/03/20 2129  INR 1.3* 1.2 1.3*   COVID-19 Labs  Recent Labs    01/04/20 2326  DDIMER 0.91*    Lab Results  Component Value Date   SARSCOV2NAA NEGATIVE 01/01/2020   [place] NEGATIVE 12/15/2019    CBC: Recent Labs  Lab 12/30/19 1615 12/30/19 1615 01/01/20 1538 01/01/20 2247 01/02/20 0250 01/03/20 2129 01/04/20 2326  WBC 10.0   < > 7.7 5.1 7.1 8.0 7.2  NEUTROABS 8,010*  --  6.1  --   --   --  5.7  HGB 9.7*   < > 7.8* 7.2* 8.2* 7.2* 7.0*  HCT 29.3*   < > 26.7* 24.0* 28.1* 24.4* 23.6*  MCV 77.3*   < > 85.9 84.8 84.4 82.2 83.1  PLT 329   < > 252 196 242 248 219   < > = values in this interval not displayed.   Cardiac Enzymes: No results for input(s): CKTOTAL, CKMB, CKMBINDEX, TROPONINI in the last 168 hours. BNP (last 3 results) No results for input(s): PROBNP in the last 8760 hours. CBG: Recent Labs  Lab 01/01/20 1516 01/04/20 2217  GLUCAP 148* 96   D-Dimer: Recent Labs    01/04/20 2326  DDIMER 0.91*   Hgb A1c: No results for input(s): HGBA1C in the last 72 hours. Lipid Profile: No results for input(s): CHOL, HDL, LDLCALC, TRIG, CHOLHDL, LDLDIRECT in the last 72 hours. Thyroid function studies: No results for input(s): TSH, T4TOTAL, T3FREE, THYROIDAB in the last 72 hours.  Invalid input(s): FREET3 Anemia work up: No results for input(s): VITAMINB12, FOLATE, FERRITIN, TIBC, IRON, RETICCTPCT in the last 72 hours. Sepsis Labs: Recent Labs  Lab 01/01/20 1538 01/01/20 1541 01/01/20 2247 01/02/20 0250 01/03/20 2129 01/04/20 2326  WBC   < >  --  5.1 7.1 8.0 7.2  LATICACIDVEN  --  1.5 1.0  --   --  0.9   < > = values in this interval not displayed.   Microbiology Recent Results (from the  past 240 hour(s))  Culture, blood (routine x 2)     Status: None (Preliminary result)   Collection Time: 01/01/20  3:25 PM   Specimen: BLOOD RIGHT FOREARM  Result Value Ref Range Status   Specimen Description BLOOD RIGHT FOREARM  Final   Special Requests NONE  Final   Culture   Final    NO GROWTH 4 DAYS Performed at [facility], [address] [place], [place] [address]    Report Status PENDING  Incomplete  Urine culture     Status: None   Collection Time: 01/01/20  3:25 PM   Specimen: Urine, Random  Result Value Ref Range Status   Specimen Description URINE, RANDOM  Final   Special Requests NONE  Final   Culture   Final    NO GROWTH Performed at [facility], [place] [address] [place], [place] [address]    Report Status 01/02/2020 FINAL  Final  Respiratory Panel by RT PCR (Flu A&B, Covid) - Nasopharyngeal Swab     Status: None   Collection Time: 01/01/20  3:38 PM   Specimen: Nasopharyngeal Swab  Result Value Ref Range Status   SARS Coronavirus 2 by RT PCR NEGATIVE NEGATIVE Final    Comment: (NOTE) SARS-CoV-2 target nucleic acids are NOT DETECTED.  The SARS-CoV-2 RNA is generally detectable in upper respiratoy specimens during the acute phase of infection. The lowest concentration of SARS-CoV-2 viral copies this assay can detect is 131 copies/mL. A negative result does not preclude SARS-Cov-2 infection and should not be used as the sole basis for treatment or other patient management decisions. A negative result may occur with  improper specimen collection/handling, submission of specimen other than nasopharyngeal swab, presence of viral mutation(s) within the areas targeted by this assay, and inadequate number of viral copies (<131 copies/mL). A negative result must be combined with clinical observations, patient history, and epidemiological information. The expected result is Negative.  Fact Sheet for Patients:   PinkCheek.be  Fact Sheet for Healthcare Providers:  GravelBags.it  This test is no t yet approved or cleared by the [place] FDA and  has been authorized for detection and/or diagnosis of SARS-CoV-2 by FDA under an Emergency Use Authorization (EUA). This EUA will remain  in effect (meaning this test can be used) for the duration of the COVID-19 declaration under Section 564(b)(1) of the Act, 21 U.S.C. section 360bbb-3(b)(1), unless the authorization is terminated or revoked sooner.     Influenza A by PCR NEGATIVE NEGATIVE Final   Influenza B by PCR NEGATIVE NEGATIVE Final    Comment: (NOTE) The Xpert Xpress SARS-CoV-2/FLU/RSV assay is intended as an aid in  the diagnosis of influenza from Nasopharyngeal swab specimens and  should not be used as a sole basis for treatment. Nasal washings and  aspirates are unacceptable for Xpert Xpress SARS-CoV-2/FLU/RSV  testing.  Fact Sheet for Patients: PinkCheek.be  Fact Sheet for Healthcare Providers: GravelBags.it  This test is not yet approved or cleared by the [place] FDA and  has been authorized for detection and/or diagnosis of SARS-CoV-2 by  FDA under an Emergency Use Authorization (EUA). This EUA will remain  in effect (meaning this test can be used) for the duration of the  Covid-19 declaration under Section 564(b)(1) of the Act, 21  U.S.C. section 360bbb-3(b)(1), unless the authorization is  terminated or revoked. Performed at  [facility], [place] [address] [place], [place] [address]   Culture, blood (routine x 2)     Status: None (Preliminary result)   Collection Time: 01/01/20  4:34 PM   Specimen: BLOOD LEFT HAND  Result Value Ref Range Status   Specimen Description BLOOD LEFT HAND  Final   Special Requests   Final    BOTTLES DRAWN AEROBIC AND ANAEROBIC Blood Culture adequate volume   Culture    Final    NO GROWTH 4 DAYS Performed at [facility], [place] [address] [place], [place] [address]    Report Status PENDING  Incomplete     Medications:   . haloperidol lactate  5 mg Intravenous Once  . multivitamin with minerals  1 tablet Oral Daily  . pantoprazole  40 mg Oral QAC breakfast  . sodium chloride flush  3 mL Intravenous Q12H  . thiamine injection  100 mg Intravenous Daily   Continuous Infusions: . sodium chloride    . sodium chloride 100 mL/hr at 01/05/20 0500      LOS: 4 days   Charlynne Cousins  [facility]  01/05/2020, 7:37 AM

## 2020-01-05 NOTE — Progress Notes (Signed)
UNASSIGNED PATIENT Subjective: Since I last evaluated the patient, he has a lymph node biopsy of an axillary lymph node.  that revealed small cell cancer-poorly differentiated malignany. He is unable to verbalize any complaints. His son is at his bedside.  Objective: Vital signs in last 24 hours: Temp:  [97.6 F (36.4 C)-100.3 F (37.9 C)] 99.6 F (37.6 C) (10/13 1132) Pulse Rate:  [99-131] 103 (10/13 1132) Resp:  [24-40] 39 (10/13 1132) BP: (107-127)/(49-100) 119/70 (10/13 1100) SpO2:  [98 %-100 %] 98 % (10/13 1132) Weight:  [77.6 kg] 77.6 kg (10/13 0412) Last BM Date: 01/02/20  Intake/Output from previous day: 10/12 0701 - 10/13 0700 In: 1361.5 [I.V.:1361.5] Out: 1275 [Urine:1275] Intake/Output this shift: Total I/O In: 0  Out: 775 [Urine:775]  General appearance: appears older than stated age, fatigued, no distress and slowed mentation Resp: clear to auscultation bilaterally Cardio: regular rate and rhythm, S1, S2 normal, no murmur, click, rub or gallop GI: soft, non-tender; bowel sounds normal; no masses,  no organomegaly Extremities: extremities normal, atraumatic, no cyanosis or edema  Lab Results: Recent Labs    01/03/20 2129 01/04/20 2326  WBC 8.0 7.2  HGB 7.2* 7.0*  HCT 24.4* 23.6*  PLT 248 219   BMET Recent Labs    01/03/20 2133 01/04/20 2326  NA 133* 135  K 4.0 3.9  CL 99 101  CO2 24 25  GLUCOSE 112* 102*  BUN 9 17  CREATININE 0.80 0.82  CALCIUM 8.0* 7.9*   LFT Recent Labs    01/04/20 2326  PROT 5.0*  ALBUMIN 1.5*  AST 50*  ALT 98*  ALKPHOS 173*  BILITOT 1.1   PT/INR Recent Labs    01/03/20 2129  LABPROT 15.4*  INR 1.3*   Hepatitis Panel No results for input(s): HEPBSAG, HCVAB, HEPAIGM, HEPBIGM in the last 72 hours. C-Diff No results for input(s): CDIFFTOX in the last 72 hours. No results for input(s): CDIFFPCR in the last 72 hours. Fecal Lactopherrin No results for input(s): FECLLACTOFRN in the last 72  hours.  Studies/Results: DG Chest 1 View  Result Date: 01/04/2020 CLINICAL DATA:  59 year old male with altered mental status, respiratory distress. EXAM: CHEST  1 VIEW COMPARISON:  Portable chest and chest CT 01/01/2020 and earlier. FINDINGS: Portable AP semi upright view at 2251 hours. Stable lung volumes and mediastinal contours. Visualized tracheal air column is within normal limits. Allowing for portable technique the lungs are clear. No pneumothorax or pleural effusion. No acute osseous abnormality identified. IMPRESSION: Negative portable chest. Electronically Signed   By: Genevie Ann M.D.   On: 01/04/2020 23:06   CT ANGIO CHEST PE W OR WO CONTRAST  Result Date: 01/05/2020 CLINICAL DATA:  Chest pain and elevated D-dimer EXAM: CT ANGIOGRAPHY CHEST WITH CONTRAST TECHNIQUE: Multidetector CT imaging of the chest was performed using the standard protocol during bolus administration of intravenous contrast. Multiplanar CT image reconstructions and MIPs were obtained to evaluate the vascular anatomy. CONTRAST:  167mL OMNIPAQUE IOHEXOL 350 MG/ML SOLN COMPARISON:  01/01/2020 chest CT FINDINGS: Cardiovascular: Suboptimal opacification of the pulmonary arteries due to bolus timing, limiting evaluation assessment for pulmonary embolus. Timing was inhibited by patient difficulty following the technologist's instructions. The size of the main pulmonary artery is enlarged, measuring 3.6 cm. Heart size is normal, with no pericardial effusion. The course and caliber of the aorta are normal. There is no atherosclerotic calcification. Opacification decreased due to pulmonary arterial phase contrast bolus timing. Mediastinum/Nodes: Multiple enlarged right axillary lymph nodes measuring up to 2.6  cm. Lungs/Pleura: There are multiple small nodules in the right lung, the largest of which measures for mm, unchanged from the recent chest CT of 01/01/2020 (series 11, image 61). Unchanged area of ground glass opacity in the  posterior right upper lobe. There is right basilar atelectasis. Upper Abdomen: Contrast bolus timing is not optimized for evaluation of the abdominal organs. Hepatosplenomegaly Musculoskeletal: No chest wall abnormality. No bony spinal canal stenosis. Review of the MIP images confirms the above findings. IMPRESSION: 1. Suboptimal opacification of the pulmonary arteries due to bolus timing, limiting evaluation for pulmonary embolus. 2. Multiple enlarged right axillary lymph nodes, measuring up to 2.6 cm. This may indicate lymphoproliferative disease such as lymphoma, or reaction to a process in the right upper extremity. 3. Enlarged main pulmonary artery, consistent with pulmonary hypertension. 4. Unchanged appearance of ground glass opacity in the posterior right upper lobe, nonspecific. Electronically Signed   By: Ulyses Jarred M.D.   On: 01/05/2020 02:55   [place] CORE BIOPSY (LYMPH NODES)  Result Date: 01/04/2020 INDICATION: 59 year old male with lymphadenopathy, concern for lymphoma. EXAM: ULTRASOUND GUIDED core BIOPSY OF right axillary lymph node MEDICATIONS: None. ANESTHESIA/SEDATION: Fentanyl 50 mcg IV; Versed 1 mg IV Moderate Sedation Time:  15 The patient was continuously monitored during the procedure by the interventional radiology nurse under my direct supervision. PROCEDURE: The procedure, risks, benefits, and alternatives were explained to the patient. Questions regarding the procedure were encouraged and answered. The patient understands and consents to the procedure. The anterior right axilla was prepped with chlorhexidine in a sterile fashion, and a sterile drape was applied covering the operative field. A sterile gown and sterile gloves were used for the procedure. Ultrasound evaluation demonstrated large right axillary lymph node. The procedure was planned. Local anesthesia was provided with 1% Lidocaine at the planned skin entry site as well as deeper along the capsule of the lymph node. A small  skin nick was made. A 17 gauge, 10 cm introducer needle was directed under ultrasound guidance into the periphery of the enlarged lymph node. Next, a total of 4 18 gauge core biopsies were obtained. The samples were placed in saline and sent to Pathology. The introducer needle was removed. Postprocedure ultrasound demonstrated no evidence of hematoma or fluid collection in the right axilla. Hemostasis was obtained with manual compression. The patient tolerated the procedure well and was transferred to the floor in stable condition. COMPLICATIONS: None immediate. FINDINGS: Right axillary lymphadenopathy. IMPRESSION: Technically successful ultrasound-guided core biopsy of pathologically enlarged right axillary lymph node. Ruthann Cancer, MD [facility] Radiology Electronically Signed   By: Ruthann Cancer MD   On: 01/04/2020 17:25   Medications: I have reviewed the patient's current medications.  Assessment/Plan: 1) Abnormal LFT's/hepatosplenmegaly-probably related to the malignancy noted on the axillary lymph node biopsy. Will sign off now. 2) Altered mental status/acute metabolic encephalopathy-etiology unclear. 3) Poorly differentiated non-small cell cancer noted on LN biopsies-oncology to evaluate.  LOS: 4 days   Juanita Craver 01/05/2020, 4:32 PM

## 2020-01-05 NOTE — Progress Notes (Signed)
PT Cancellation Note  Patient Details Name: Brian Ochoa MRN: [id number] DOB: 1961/01/01   Cancelled Treatment:    Reason Eval/Treat Not Completed: (P) Patient not medically ready Pt has fever and is not responding to commands. RN request hold PT until tomorrow. PT will follow back then   [name] B. Migdalia Dk PT, DPT Acute Rehabilitation Services Pager [phone] Office [phone]  [place] 01/05/2020, 2:31 PM

## 2020-01-06 ENCOUNTER — Inpatient Hospital Stay (HOSPITAL_COMMUNITY): Payer: Worker's Compensation

## 2020-01-06 ENCOUNTER — Inpatient Hospital Stay (HOSPITAL_COMMUNITY): Payer: Worker's Compensation | Admitting: Certified Registered"

## 2020-01-06 ENCOUNTER — Encounter (HOSPITAL_COMMUNITY)
Admission: EM | Disposition: A | Discharge status: Hospice | Payer: Self-pay | Source: Home / Self Care | Attending: Internal Medicine

## 2020-01-06 ENCOUNTER — Encounter (HOSPITAL_COMMUNITY): Payer: Self-pay | Admitting: Internal Medicine

## 2020-01-06 DIAGNOSIS — R591 Generalized enlarged lymph nodes: Secondary | ICD-10-CM

## 2020-01-06 DIAGNOSIS — R0603 Acute respiratory distress: Secondary | ICD-10-CM

## 2020-01-06 DIAGNOSIS — C799 Secondary malignant neoplasm of unspecified site: Secondary | ICD-10-CM

## 2020-01-06 DIAGNOSIS — C801 Malignant (primary) neoplasm, unspecified: Secondary | ICD-10-CM

## 2020-01-06 HISTORY — PX: RADIOLOGY WITH ANESTHESIA: SHX6223

## 2020-01-06 LAB — PROTEIN ELECTROPHORESIS, SERUM
A/G Ratio: 0.6 — ABNORMAL LOW (ref 0.7–1.7)
Albumin ELP: 1.9 g/dL — ABNORMAL LOW (ref 2.9–4.4)
Alpha-1-Globulin: 0.5 g/dL — ABNORMAL HIGH (ref 0.0–0.4)
Alpha-2-Globulin: 0.9 g/dL (ref 0.4–1.0)
Beta Globulin: 1 g/dL (ref 0.7–1.3)
Gamma Globulin: 0.8 g/dL (ref 0.4–1.8)
Globulin, Total: 3.2 g/dL (ref 2.2–3.9)
Total Protein ELP: 5.1 g/dL — ABNORMAL LOW (ref 6.0–8.5)

## 2020-01-06 LAB — BASIC METABOLIC PANEL
Anion gap: 8 (ref 5–15)
BUN: 19 mg/dL (ref 6–20)
CO2: 27 mmol/L (ref 22–32)
Calcium: 7.8 mg/dL — ABNORMAL LOW (ref 8.9–10.3)
Chloride: 103 mmol/L (ref 98–111)
Creatinine, Ser: 0.77 mg/dL (ref 0.61–1.24)
GFR, Estimated: >60 mL/min (ref >60)
Glucose, Bld: 108 mg/dL — ABNORMAL HIGH (ref 70–99)
Potassium: 3.8 mmol/L (ref 3.5–5.1)
Sodium: 138 mmol/L (ref 135–145)

## 2020-01-06 LAB — CBC
HCT: 22.4 % — ABNORMAL LOW (ref 39.0–52.0)
Hemoglobin: 6.5 g/dL — CL (ref 13.0–17.0)
MCH: 24.2 pg — ABNORMAL LOW (ref 26.0–34.0)
MCHC: 29 g/dL — ABNORMAL LOW (ref 30.0–36.0)
MCV: 83.3 fL (ref 80.0–100.0)
Platelets: 184 10*3/uL (ref 150–400)
RBC: 2.69 MIL/uL — ABNORMAL LOW (ref 4.22–5.81)
RDW: 17.9 % — ABNORMAL HIGH (ref 11.5–15.5)
WBC: 7 10*3/uL (ref 4.0–10.5)
nRBC: 0 % (ref 0.0–0.2)

## 2020-01-06 LAB — CULTURE, BLOOD (ROUTINE X 2)
Culture: NO GROWTH
Culture: NO GROWTH
Special Requests: ADEQUATE

## 2020-01-06 LAB — PREPARE RBC (CROSSMATCH)

## 2020-01-06 IMAGING — MR MR MRV HEAD WO/W CM
4 series · 20 of 48 positions shown · IV contrast (gadavist)
Comparison: Concurrently performed MRI of the brain and MRA of the
head.

CLINICAL DATA: Cerebral arteritis.

EXAM:
MR VENOGRAM HEAD WITHOUT AND WITH CONTRAST
TECHNIQUE: Angiographic images of the intracranial venous structures were
obtained using MRV technique without and with intravenous contrast.
CONTRAST:  7.6mL GADAVIST GADOBUTROL 1 MMOL/ML IV SOLN

[Series 9: MRV · coronal · 1.5mm · 0.43mm/px · 7 of 121 slices shown (1 of 2)]
[im 1/121]
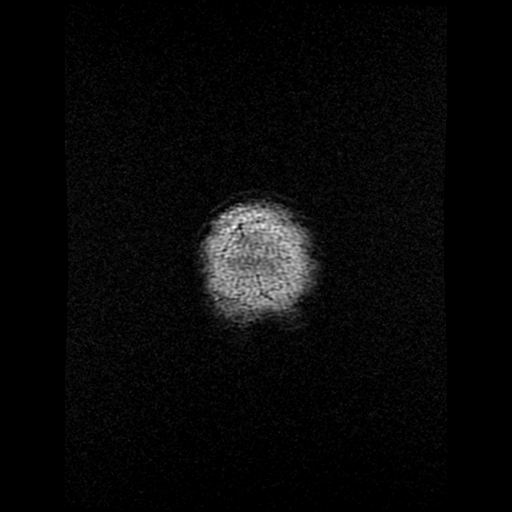
[im 21/121]
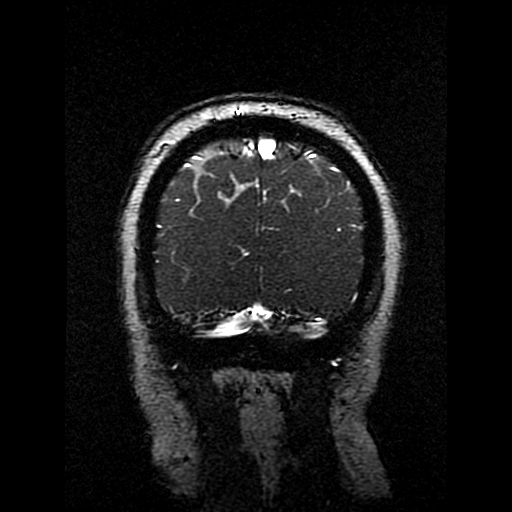
[im 41/121]
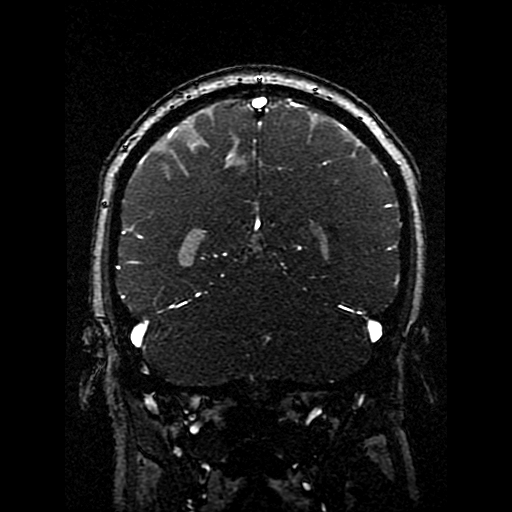
[im 61/121]
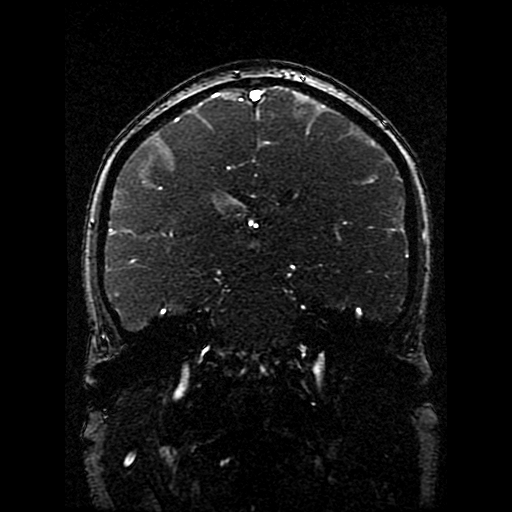
[im 81/121]
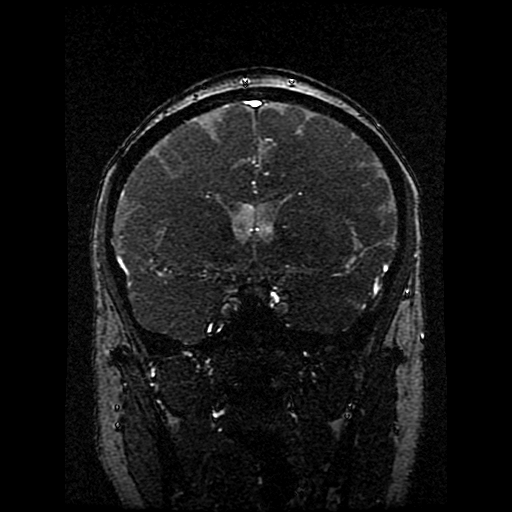
[im 101/121]
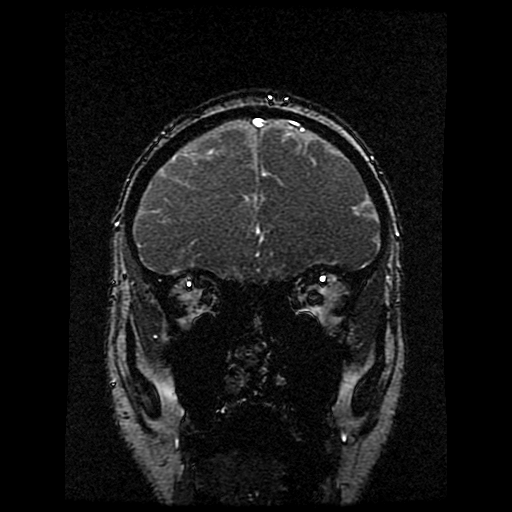
[im 121/121]
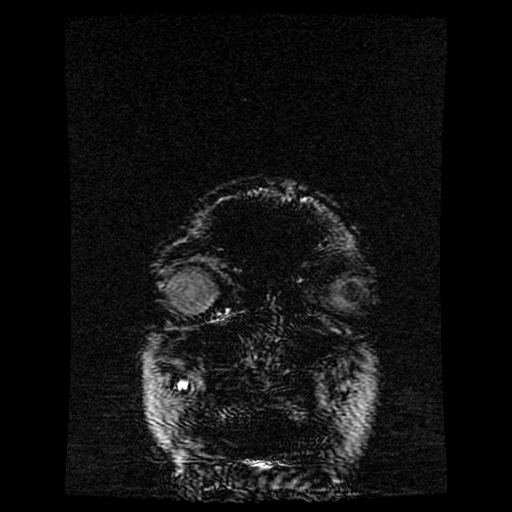

[Series 10: MRV · sagittal · 1.8mm · 0.47mm/px · 7 of 325 slices shown (2 of 2)]
[im 21/325]
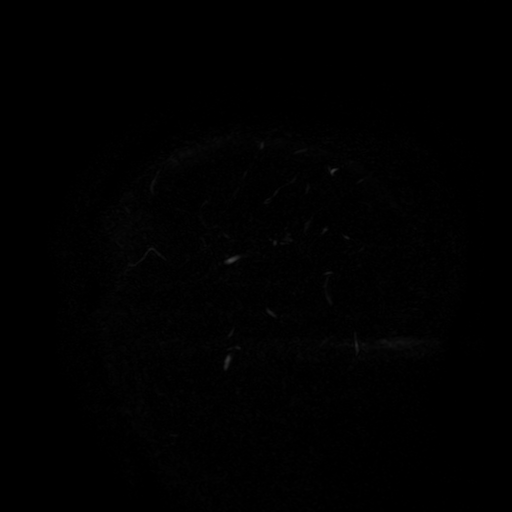
[im 41/325]
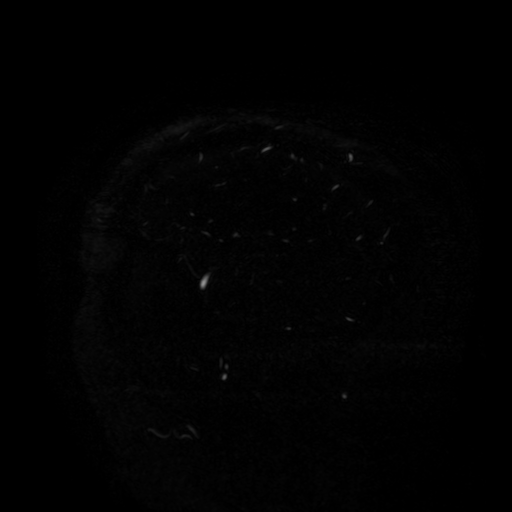
[im 61/325]
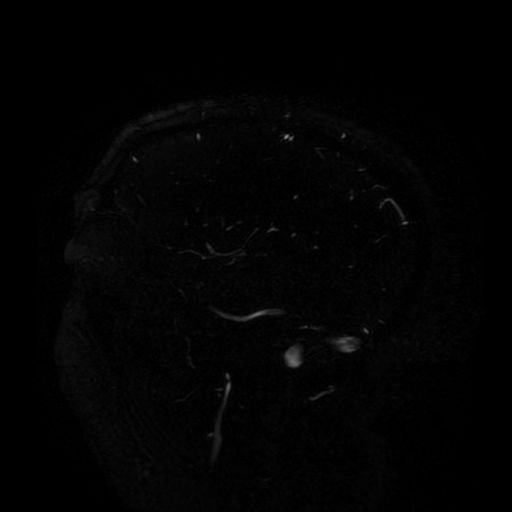
[im 102/325]
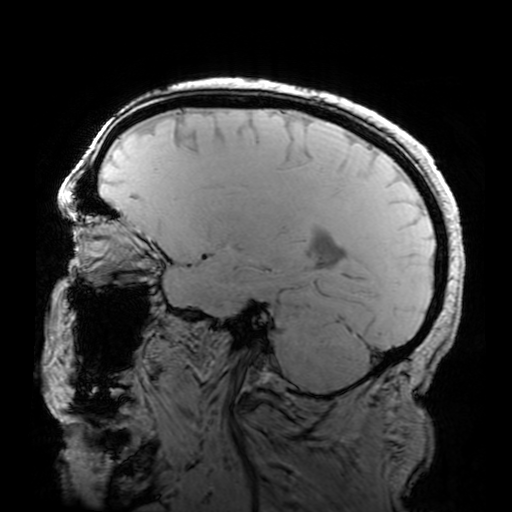
[im 142/325]
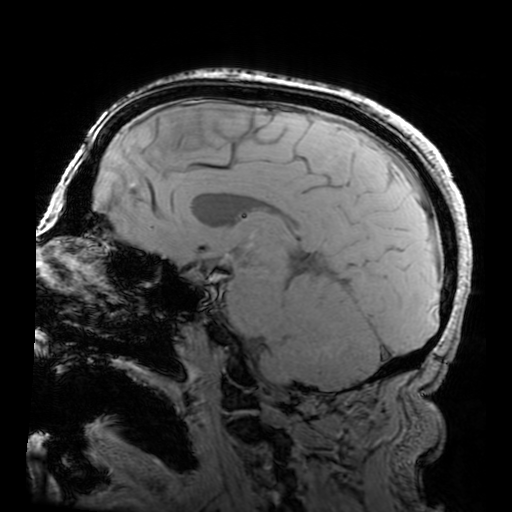
[im 163/325]
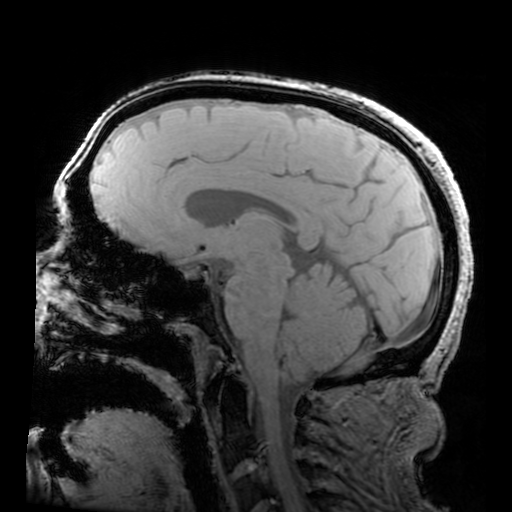
[im 284/325]
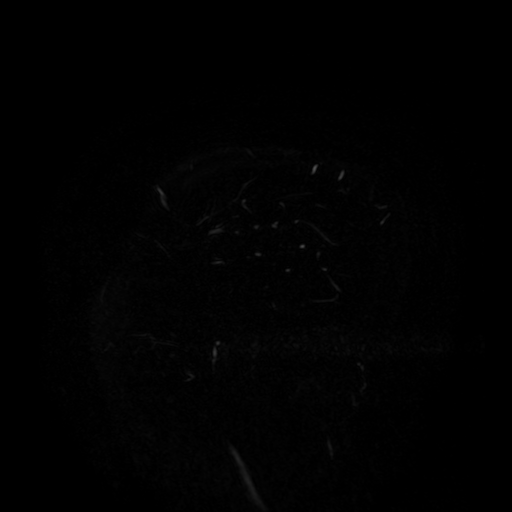

[Series 1500: multiplanar reconstruction (mpr) · sagittal · 0.9mm · 0.47mm/px · 3 of 205 slices shown (1 of 2)]
[im 21/205]
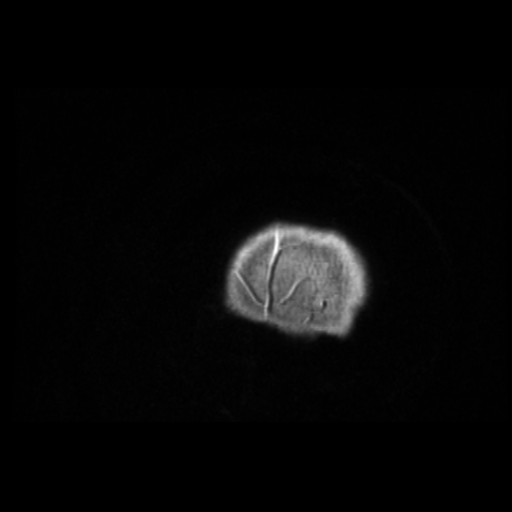
[im 103/205]
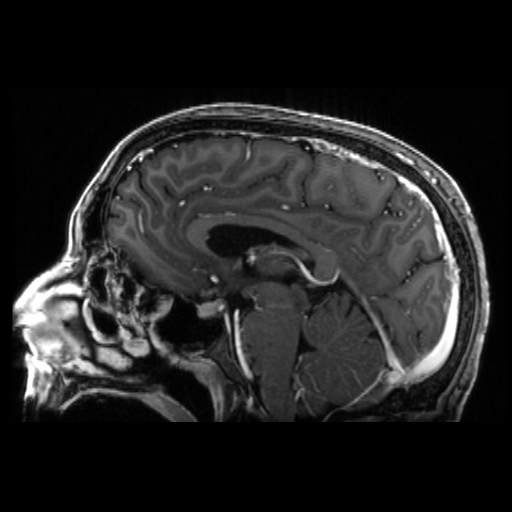
[im 184/205]
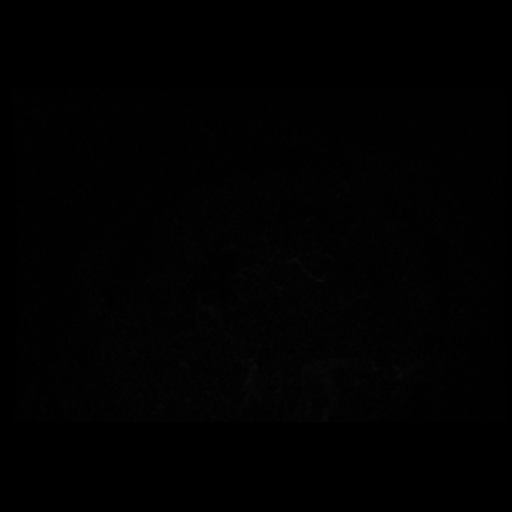

[Series 1501: multiplanar reconstruction (mpr) · coronal · 0.9mm · 0.47mm/px · 3 of 252 slices shown (2 of 2)]
[im 42/252]
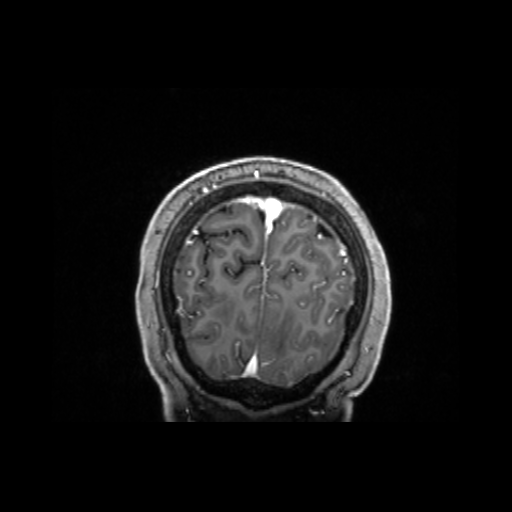
[im 126/252]
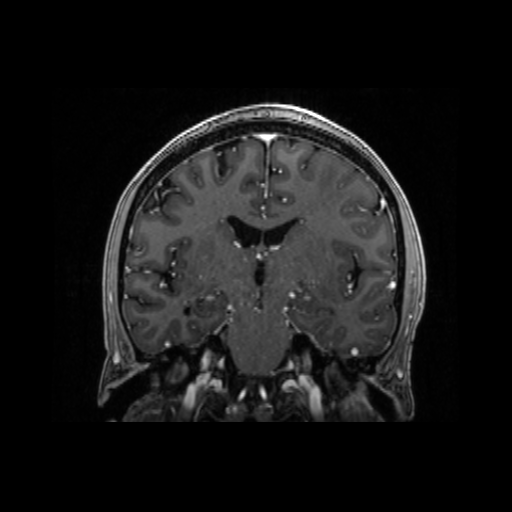
[im 210/252]
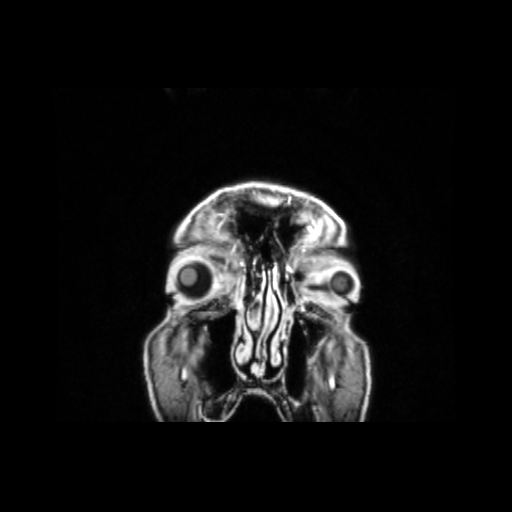

[20 of 48 positions shown; findings below may reference images not displayed]

FINDINGS: The superior sagittal sinus, internal cerebral veins, vein of SHKUMBIN XHENETE,
straight sinus, transverse sinuses, sigmoid sinuses and visualized
jugular veins are patent. There is no appreciable intracranial
venous thrombosis.
IMPRESSION: No evidence of intracranial venous thrombosis.

## 2020-01-06 IMAGING — MR MR MRA NECK WO/W CM
4 of 7 series · 19 of 48 positions shown · IV contrast (Yes GAD)
Comparison: CT of the neck soft tissues [DATE].

CLINICAL DATA: Carotid artery dissection suspected.

EXAM:
MRA NECK WITHOUT AND WITH CONTRAST
TECHNIQUE: Multiplanar and multiecho pulse sequences of the neck were obtained
without and with intravenous contrast. Angiographic images of the
neck were obtained using MRA technique without and with intravenous
contrast.
CONTRAST:  7.6mL GADAVIST GADOBUTROL 1 MMOL/ML IV SOLN

[Series 1400: cor cemra ft · coronal · 1.2mm · 0.59mm/px · 7 of 121 slices shown]
[im 1/121]
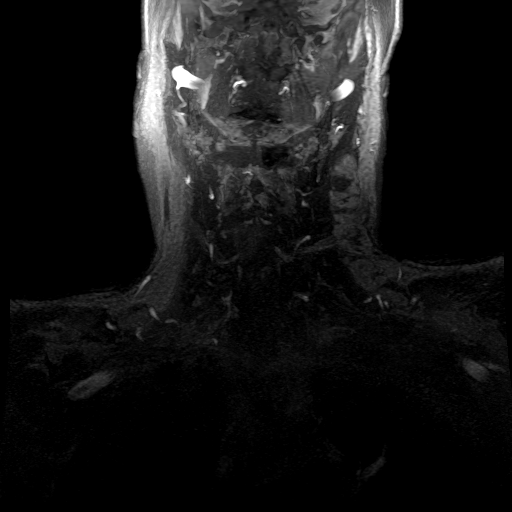
[im 21/121]
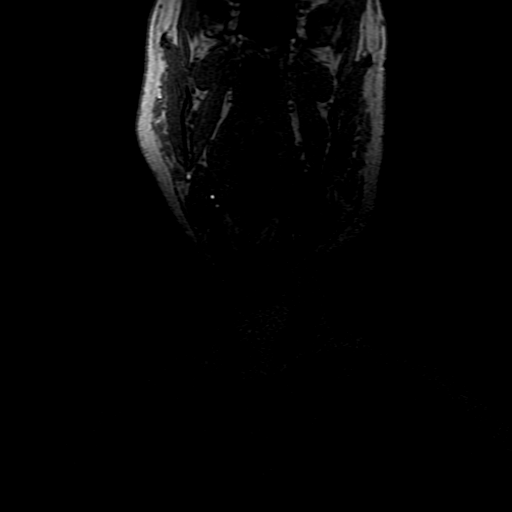
[im 41/121]
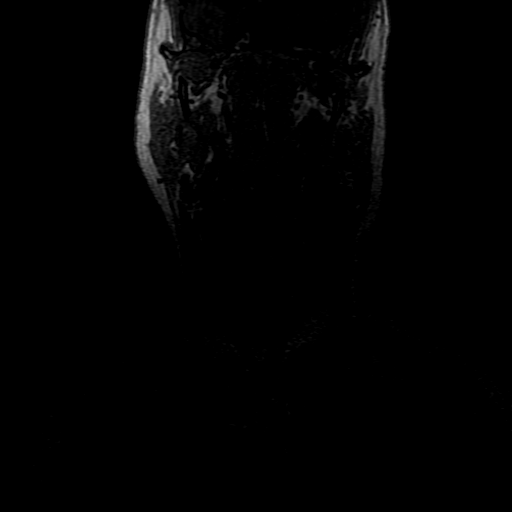
[im 61/121]
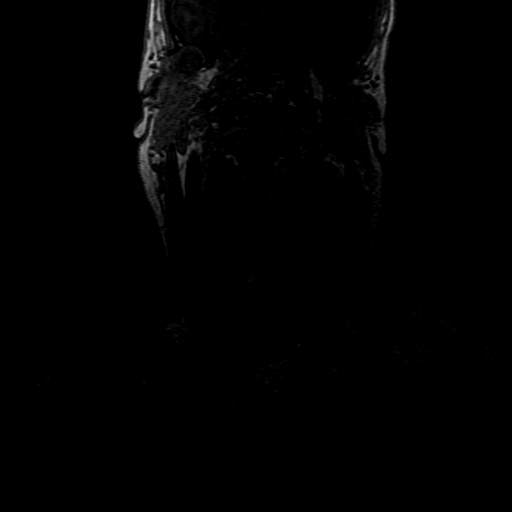
[im 81/121]
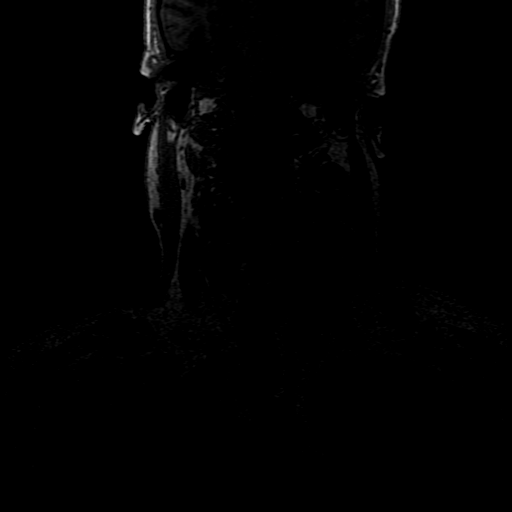
[im 101/121]
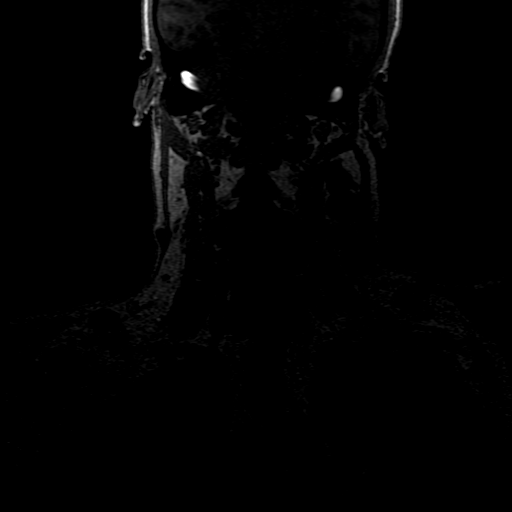
[im 121/121]
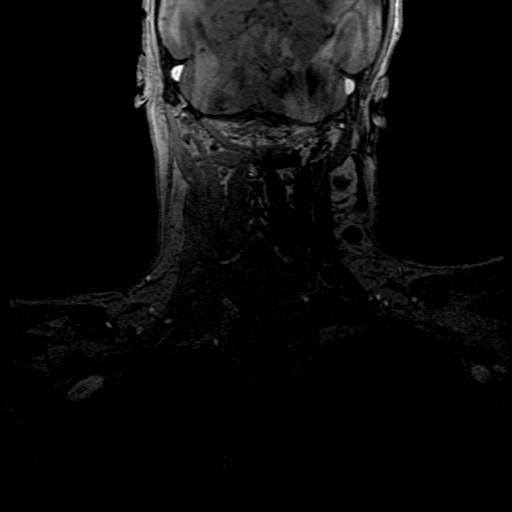

[Series 1401: ph1/cor cemra ft · coronal · 1.2mm · 0.59mm/px · 6 of 120 slices shown]
[im 1/120]
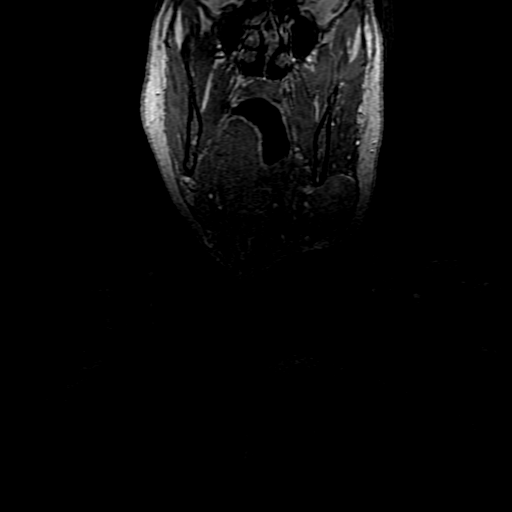
[im 18/120]
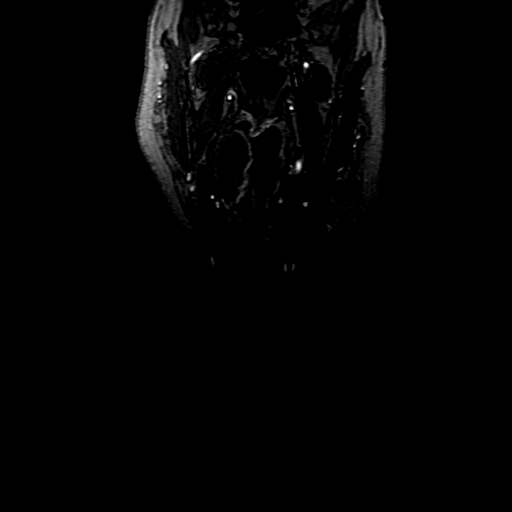
[im 35/120]
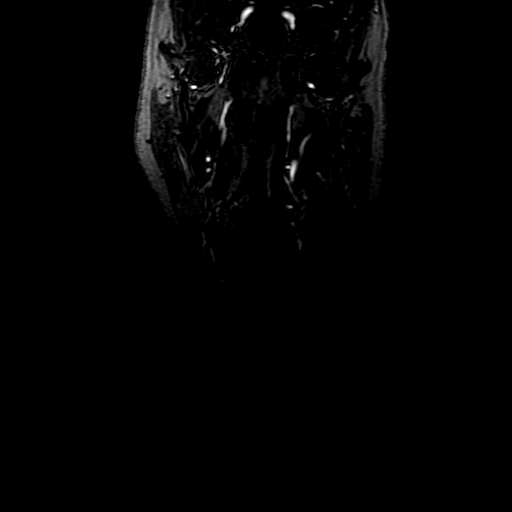
[im 52/120]
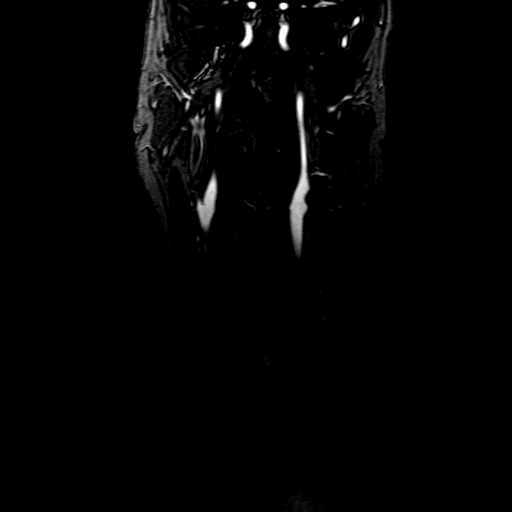
[im 69/120]
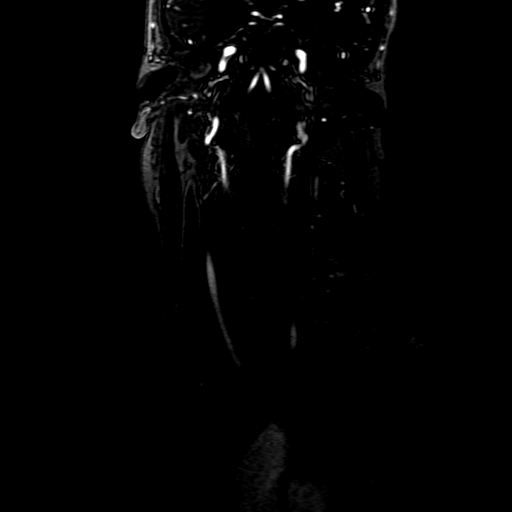
[im 103/120]
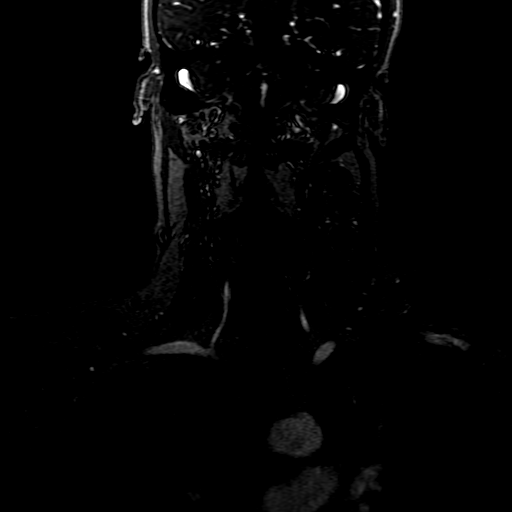

[Series 1402: ph2/cor cemra ft · coronal · 1.2mm · 0.59mm/px · 3 of 121 slices shown]
[im 18/121]
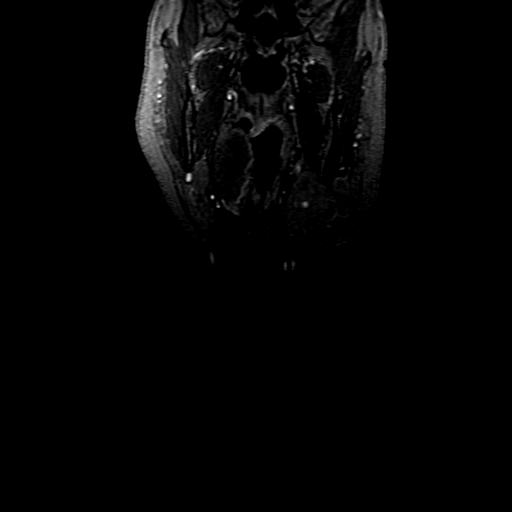
[im 69/121]
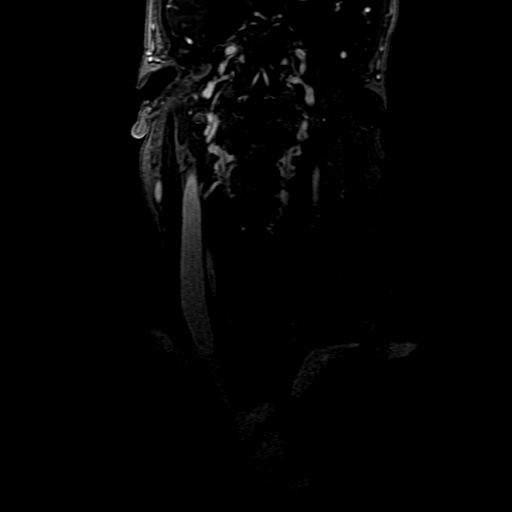
[im 103/121]
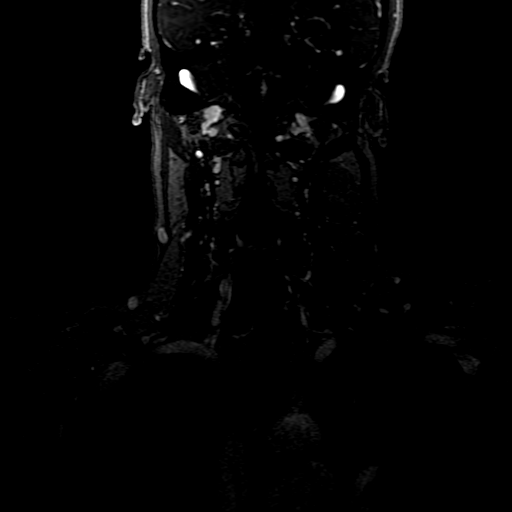

[((date))-((date)) · coronal · 1.2mm · 0.59mm/px · 3 of 121 slices shown]
[im 18/121]
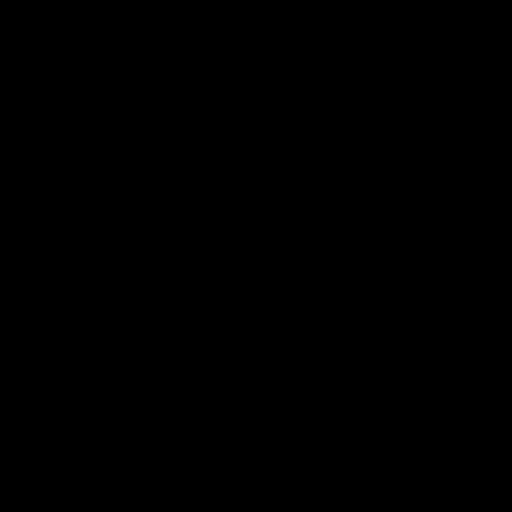
[im 69/121]
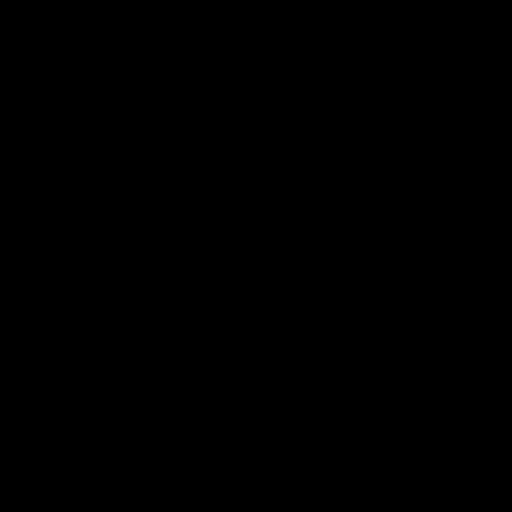
[im 103/121]
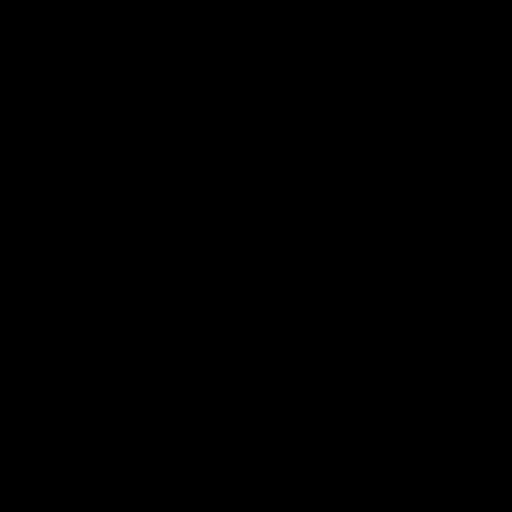

[19 of 48 positions shown; findings below may reference images not displayed]

FINDINGS: Standard aortic branching. The visualized aortic arch is
unremarkable.

The bilateral common and internal carotid arteries are patent within
the neck without hemodynamically significant stenosis (50% or
greater). Mild atherosclerotic plaque within the left carotid bulb
(series [07], image 51).

The vertebral arteries are codominant and patent within the neck.
Moderate atherosclerotic narrowing at the origin of the left
vertebral artery.

Prominent cervical lymphadenopathy.
IMPRESSION: The bilateral common and internal carotid arteries are patent within
the neck without hemodynamically significant stenosis. Mild
atherosclerotic plaque within the left carotid bulb.

The vertebral arteries are codominant and patent within the neck
bilaterally. Moderate atherosclerotic narrowing at the origin of the
left vertebral artery.

No evidence of dissection.

Prominent cervical lymphadenopathy.

## 2020-01-06 IMAGING — MR MR HEAD WO/W CM
3 of 14 series · 4 of 48 positions shown · IV contrast (gadavist)
Comparison: Brain MRI [DATE].
COMPARISON: Brain MRI [DATE].

Addendum:
CLINICAL DATA: Delirium.

EXAM:
MRI HEAD WITHOUT AND WITH CONTRAST
MRA HEAD WITHOUT CONTRAST
TECHNIQUE: Multiplanar, multiecho pulse sequences of the brain and surrounding
structures were obtained without and with intravenous contrast.
Angiographic images of the head were obtained using MRA technique
without contrast.
CONTRAST:  7.6mL GADAVIST GADOBUTROL 1 MMOL/ML IV SOLN

[Series 5: FLAIR · sagittal · 5.0mm · 0.23mm/px · 2 of 24 slices shown (1 of 2)]
[im 1/24]
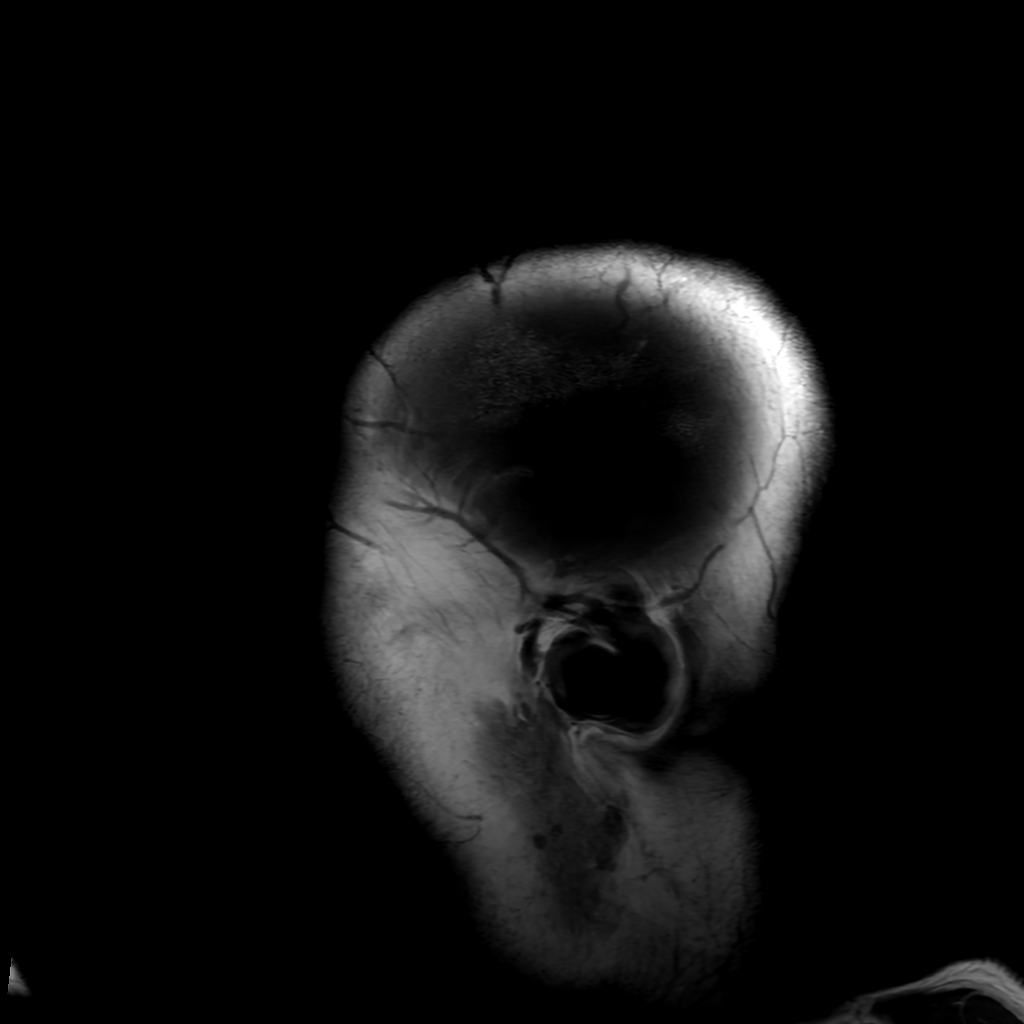
[im 24/24]
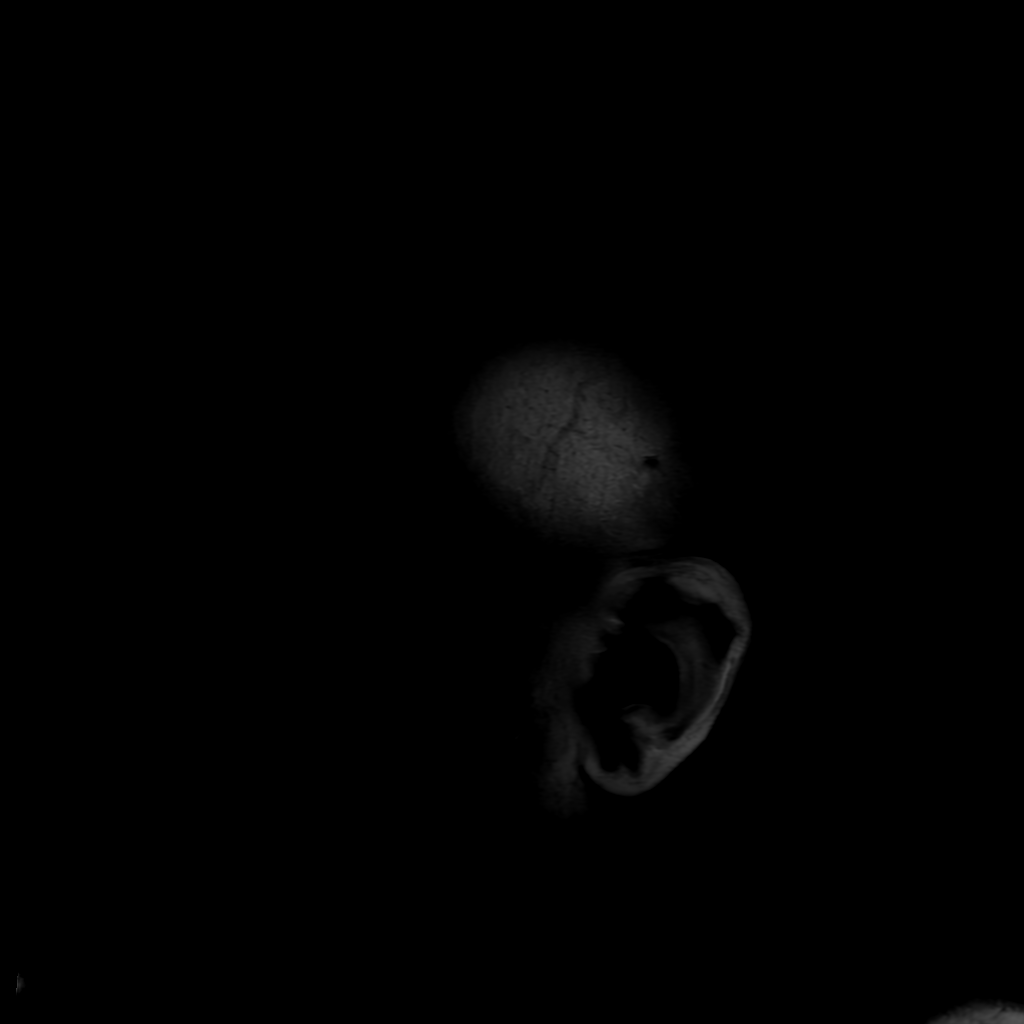

[Series 7: FLAIR · axial · 3.0mm · 0.41mm/px · 1 of 23 slices shown (2 of 2)]
[im 1/23]
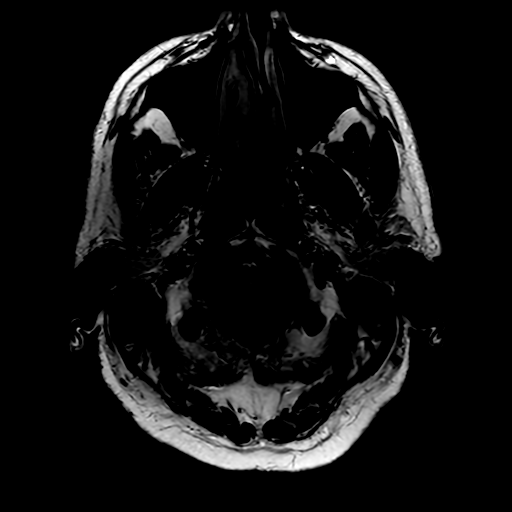

[Series 18: FLAIR post-contrast · sagittal · 5.0mm · 0.23mm/px · 1 of 24 slices shown]
[im 1/24]
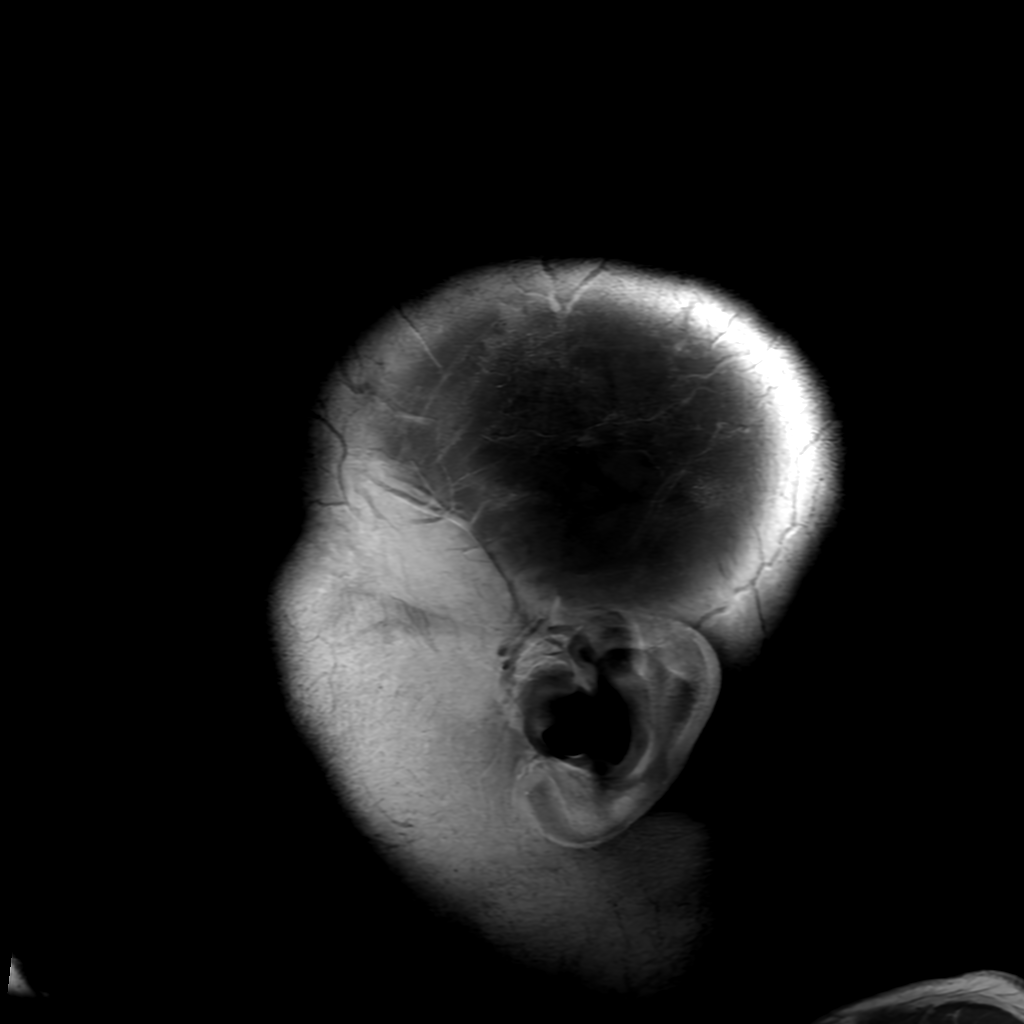

[4 of 48 positions shown; findings below may reference images not displayed]

FINDINGS: MRI HEAD FINDINGS

Cerebral volume is normal.

There are now at least 14 punctate foci of restricted diffusion
consistent with acute/early subacute infarcts (see annotations on
series 2). These foci are predominantly scattered within the
bilateral cerebral hemispheres, although there is also a focus
within the right middle cerebellar peduncle. Several of these
lesions are new as compared to the prior brain MRI of [DATE]
(previously numbering 7).

Additionally, there has been significant interval progression of now
extensive patchy cortical/subcortical T2/FLAIR hyperintensity
throughout both cerebral hemispheres. Foci of T2/FLAIR
hyperintensity are also now present within the pons, right middle
cerebellar peduncle and right cerebellar white matter (series 7,
image 6) (series 7, image 5).

Also significantly progressed from the prior MRI, there are numerous
scattered microhemorrhages and petechial hemorrhage within the
supratentorial and infratentorial brain.

Additionally, multifocal abnormal sulcal enhancement is questioned
on the T1 weighted postcontrast sequence (for instance as seen on
series 17, image 12).

No evidence of intracranial mass.

No extra-axial fluid collection.

No midline shift.

Vascular: Reported below.

Skull and upper cervical spine: No focal marrow lesion.

Sinuses/Orbits: Visualized orbits show no acute finding. Mild
ethmoid and sphenoid sinus mucosal thickening. Large bilateral
mastoid effusions.

MRA HEAD FINDINGS

The intracranial internal carotid arteries are patent. The M1 middle
cerebral arteries are patent without significant stenosis. No M2
proximal branch occlusion or high-grade proximal stenosis is
identified. The anterior cerebral arteries are patent.

The intracranial vertebral arteries are patent. The basilar artery
is patent. The posterior cerebral arteries are patent. Posterior
communicating arteries are present bilaterally.

No intracranial aneurysm is identified.

These results will be called to the ordering clinician or
representative by the Radiologist Assistant, and communication
documented in the PACS or [REDACTED].
IMPRESSION: MRI brain:

There are now at least 14 punctate acute/early subacute infarcts
which are predominantly within the bilateral cerebral hemispheres,
with an additional infarct in the right middle cerebellar peduncle.
Several of these lesions are new as compared to [DATE]
(previously numbering 7). Additionally, there has been significant
interval progression of now extensive patchy cortical/subcortical
T2/FLAIR hyperintensity throughout both cerebral hemispheres, as
well as new small foci of T2/FLAIR hyperintensity within the pons,
right middle cerebellar peduncle and right cerebellar white matter.
There are now extensive multifocal microhemorrhages and petechial
hemorrhage within the supratentorial and infratentorial brain,
significantly progressed. Multifocal abnormal sulcal enhancement is
also questioned. Favored differential considerations include
vasculitis, autoimmune cerebritis and infectious cerebritis. Given
the numerous small infarcts, a superimposed embolic process is
difficult to exclude. Demyelinating disease is considered less
likely. Correlation with CSF analysis is recommended.

MRA head:

1. No intracranial large vessel occlusion or proximal high-grade
arterial stenosis
2. No significant vessel irregularity is appreciated.
3. Catheter based angiography may be helpful for further evaluation
of the small arterial vessels.

ADDENDUM:
Extensive cervical lymphadenopathy is subsequently noted on the
concurrently performed MRA of the neck. In addition to the
previously provided differential considerations, intravascular
lymphoma is a strong differential consideration for the described
intracranial findings.

This addendum will be called to the ordering clinician or
representative by the Radiologist Assistant, and communication
documented in the PACS or [REDACTED].

*** End of Addendum ***
FINDINGS: MRI HEAD FINDINGS

Cerebral volume is normal.

There are now at least 14 punctate foci of restricted diffusion
consistent with acute/early subacute infarcts (see annotations on
series 2). These foci are predominantly scattered within the
bilateral cerebral hemispheres, although there is also a focus
within the right middle cerebellar peduncle. Several of these
lesions are new as compared to the prior brain MRI of [DATE]
(previously numbering 7).

Additionally, there has been significant interval progression of now
extensive patchy cortical/subcortical T2/FLAIR hyperintensity
throughout both cerebral hemispheres. Foci of T2/FLAIR
hyperintensity are also now present within the pons, right middle
cerebellar peduncle and right cerebellar white matter (series 7,
image 6) (series 7, image 5).

Also significantly progressed from the prior MRI, there are numerous
scattered microhemorrhages and petechial hemorrhage within the
supratentorial and infratentorial brain.

Additionally, multifocal abnormal sulcal enhancement is questioned
on the T1 weighted postcontrast sequence (for instance as seen on
series 17, image 12).

No evidence of intracranial mass.

No extra-axial fluid collection.

No midline shift.

Vascular: Reported below.

Skull and upper cervical spine: No focal marrow lesion.

Sinuses/Orbits: Visualized orbits show no acute finding. Mild
ethmoid and sphenoid sinus mucosal thickening. Large bilateral
mastoid effusions.

MRA HEAD FINDINGS

The intracranial internal carotid arteries are patent. The M1 middle
cerebral arteries are patent without significant stenosis. No M2
proximal branch occlusion or high-grade proximal stenosis is
identified. The anterior cerebral arteries are patent.

The intracranial vertebral arteries are patent. The basilar artery
is patent. The posterior cerebral arteries are patent. Posterior
communicating arteries are present bilaterally.

No intracranial aneurysm is identified.

These results will be called to the ordering clinician or
representative by the Radiologist Assistant, and communication
documented in the PACS or [REDACTED].
IMPRESSION: MRI brain:

There are now at least 14 punctate acute/early subacute infarcts
which are predominantly within the bilateral cerebral hemispheres,
with an additional infarct in the right middle cerebellar peduncle.
Several of these lesions are new as compared to [DATE]
(previously numbering 7). Additionally, there has been significant
interval progression of now extensive patchy cortical/subcortical
T2/FLAIR hyperintensity throughout both cerebral hemispheres, as
well as new small foci of T2/FLAIR hyperintensity within the pons,
right middle cerebellar peduncle and right cerebellar white matter.
There are now extensive multifocal microhemorrhages and petechial
hemorrhage within the supratentorial and infratentorial brain,
significantly progressed. Multifocal abnormal sulcal enhancement is
also questioned. Favored differential considerations include
vasculitis, autoimmune cerebritis and infectious cerebritis. Given
the numerous small infarcts, a superimposed embolic process is
difficult to exclude. Demyelinating disease is considered less
likely. Correlation with CSF analysis is recommended.

MRA head:

1. No intracranial large vessel occlusion or proximal high-grade
arterial stenosis
2. No significant vessel irregularity is appreciated.
3. Catheter based angiography may be helpful for further evaluation
of the small arterial vessels.

## 2020-01-06 SURGERY — MRI WITH ANESTHESIA
Anesthesia: General

## 2020-01-06 MED ORDER — LACTATED RINGERS IV SOLN: INTRAVENOUS | Status: DC

## 2020-01-06 MED ORDER — SODIUM CHLORIDE 0.9% IV SOLUTION: Freq: Once | INTRAVENOUS | Status: AC

## 2020-01-06 MED ORDER — CHLORHEXIDINE GLUCONATE 0.12 % MT SOLN: 15.0000 mL | OROMUCOSAL | Status: AC

## 2020-01-06 MED ORDER — THIAMINE HCL 100 MG/ML IJ SOLN
100.0000 mg | Freq: Every day | INTRAMUSCULAR | Status: DC
  Administered 2020-01-06: 100 mg via INTRAVENOUS
  Filled 2020-01-06: qty 2

## 2020-01-06 MED ORDER — KCL IN DEXTROSE-NACL 10-5-0.45 MEQ/L-%-% IV SOLN
INTRAVENOUS | Status: DC
  Filled 2020-01-06 (×3): qty 1000

## 2020-01-06 MED ORDER — GADOBUTROL 1 MMOL/ML IV SOLN
7.6000 mL | Freq: Once | INTRAVENOUS | Status: AC | PRN
  Administered 2020-01-06: 7.6 mL via INTRAVENOUS

## 2020-01-06 MED ORDER — CHLORHEXIDINE GLUCONATE 0.12 % MT SOLN
OROMUCOSAL | Status: AC
  Filled 2020-01-06: qty 15

## 2020-01-06 MED ORDER — METOPROLOL TARTRATE 5 MG/5ML IV SOLN
5.0000 mg | Freq: Four times a day (QID) | INTRAVENOUS | Status: DC
  Administered 2020-01-06 – 2020-01-07 (×4): 5 mg via INTRAVENOUS
  Filled 2020-01-06 (×4): qty 5

## 2020-01-06 NOTE — Progress Notes (Signed)
Occupational Therapy Treatment Patient Details Name: Brian Ochoa MRN: [id number] DOB: Mar 01, 1961 Today's Date: 01/06/2020    History of present illness Pt is a 59 y.o. M with significant PMH of TIA and HCV who presents with acute encephalopathy, lymphadenopathy of head/neck (CT findings concerning for malignancy vs lymphoma vs leukemia vs mets). Brain MRI showing 7 punctate foci of restricted diffusion seen scattered within thecortical and subcortical brain of both hemispheres consistent with small acute infarctions.   OT comments  Max assist to position pt seated at EOB. Demonstrated zero sitting balance, tolerated x 5 before fatiguing and pushing trunk back to return to supine. Pt continues to have poor attention and did not follow commands today, although he did make decision when asked whether he wanted his socks on and requested his head be raised once in supine. Pt's dad and sister at bedside and very supportive.   Follow Up Recommendations  SNF;Supervision/Assistance - 24 hour    Equipment Recommendations  Other (comment) (defer to next venue)    Recommendations for Other Services      Precautions / Restrictions Precautions Precautions: Fall Restrictions Weight Bearing Restrictions: No       Mobility Bed Mobility Overal bed mobility: Needs Assistance Bed Mobility: Supine to Sit;Sit to Supine     Supine to sit: Max assist Sit to supine: Mod assist   General bed mobility comments: assist for LEs over EOB, pt assisted minimally with raising trunk, guided trunk and assisted LEs minimally to return to supine  Transfers                 General transfer comment: deferred    Balance Overall balance assessment: Needs assistance Sitting-balance support: Feet supported Sitting balance-Leahy Scale: Zero Sitting balance - Comments: sat x 5 minutes                                   ADL either performed or assessed with clinical judgement   ADL                                          General ADL Comments: requiring total assist, currently NPO for test     Vision       Perception     Praxis      Cognition Arousal/Alertness: Awake/alert Behavior During Therapy: Flat affect   Area of Impairment: Following commands;Attention;Safety/judgement                   Current Attention Level: Focused   Following Commands:  (no following commands) Safety/Judgement: Decreased awareness of deficits;Decreased awareness of safety              Exercises     Shoulder Instructions       General Comments      Pertinent Vitals/ Pain       Pain Assessment: Faces Faces Pain Scale: Hurts little more Pain Location: generalized Pain Descriptors / Indicators: Grimacing Pain Intervention(s): Monitored during session;Repositioned  Home Living                                          Prior Functioning/Environment              Frequency  Min 2X/week        Progress Toward Goals  OT Goals(current goals can now be found in the care plan section)  Progress towards OT goals: Not progressing toward goals - comment (decreased activity tolerance, impaired cognition)  Acute Rehab OT Goals Patient Stated Goal: for pt to return to normal (wife) OT Goal Formulation: With family Time For Goal Achievement: 01/17/20 Potential to Achieve Goals: [place] Discharge plan needs to be updated    Co-evaluation                 AM-PAC OT "6 Clicks" Daily Activity     Outcome Measure   Help from another person eating meals?: Total Help from another person taking care of personal grooming?: Total Help from another person toileting, which includes using toliet, bedpan, or urinal?: Total Help from another person bathing (including washing, rinsing, drying)?: Total Help from another person to put on and taking off regular upper body clothing?: Total Help from another person to put on  and taking off regular lower body clothing?: Total 6 Click Score: 6    End of Session    OT Visit Diagnosis: Other symptoms and signs involving cognitive function;Muscle weakness (generalized) (M62.81);Pain   Activity Tolerance Patient tolerated treatment well   Patient Left in bed;with family/visitor present;with bed alarm set;with call bell/phone within reach   Nurse Communication          Time: 0934-1000 OT Time Calculation (min): 26 min  Charges: OT General Charges $OT Visit: 1 Visit OT Treatments $Therapeutic Activity: 23-37 mins  Brian Ochoa, OTR/L Acute Rehabilitation Services Pager: [phone] Office: [phone]   Brian Ochoa 01/06/2020, 10:07 AM

## 2020-01-06 NOTE — Anesthesia Postprocedure Evaluation (Signed)
Anesthesia Post Note  Patient: Brian Ochoa  Procedure(s) Performed: MRI WITH BRAIN WITH CONTRAST,NECK WITH,ANGIO WITHOUT CONTRAST,MRV HEAD WITH AND WITHOUT (N/A )     Patient location during evaluation: PACU Anesthesia Type: General Level of consciousness: awake Pain management: pain level controlled Vital Signs Assessment: post-procedure vital signs reviewed and stable Respiratory status: spontaneous breathing Cardiovascular status: stable Postop Assessment: no apparent nausea or vomiting Anesthetic complications: no   No complications documented.  Last Vitals:  Vitals:   01/06/20 1350 01/06/20 1700  BP: 114/65 107/70  Pulse: (!) 116 100  Resp: (!) 24 (!) 30  Temp: 36.7 C   SpO2: 99% 98%    Last Pain:  Vitals:   01/06/20 1350  TempSrc:   PainSc: 0-No pain                 Vy Badley

## 2020-01-06 NOTE — Progress Notes (Signed)
Anesthesia made aware of hemoglobin 7.0. No new orders at this time.

## 2020-01-06 NOTE — Transfer of Care (Signed)
Immediate Anesthesia Transfer of Care Note  Patient: Brian Ochoa  Procedure(s) Performed: MRI WITH BRAIN WITH CONTRAST,NECK WITH,ANGIO WITHOUT CONTRAST,MRV HEAD WITH AND WITHOUT (N/A )  Patient Location: PACU  Anesthesia Type:General  Level of Consciousness: drowsy  Airway & Oxygen Therapy: Patient Spontanous Breathing and Patient connected to nasal cannula oxygen  Post-op Assessment: Report given to RN, Post -op Vital signs reviewed and stable and Patient moving all extremities  Post vital signs: Reviewed and stable  Last Vitals:  Vitals Value Taken Time  BP 109/63 01/06/20 1335  Temp    Pulse 117 01/06/20 1337  Resp 31 01/06/20 1337  SpO2 98 % 01/06/20 1337  Vitals shown include unvalidated device data.  Last Pain:  Vitals:   01/06/20 1335  TempSrc:   PainSc: 0-No pain         Complications: No complications documented.

## 2020-01-06 NOTE — Anesthesia Preprocedure Evaluation (Addendum)
Anesthesia Evaluation  Patient identified by MRN, date of birth, ID band Patient confused    Reviewed: Allergy & Precautions, NPO status , Patient's Chart, lab work & pertinent test results, Unable to perform ROS - Chart review only  History of Anesthesia Complications Negative for: history of anesthetic complications  Airway Mallampati: III  TM Distance: >3 FB Neck ROM: Full   Comment: Pt uncooperative Dental  (+) Implants, Caps, Dental Advisory Given   Pulmonary COPD,  COPD inhaler, Current Smoker,  01/01/2020 SARS coronavirus NEG Recent dx poorly differentiated non-small cell carcinoma   breath sounds clear to auscultation       Cardiovascular + Peripheral Vascular Disease (carotid dissection)   Rhythm:Regular Rate:Tachycardia     Neuro/Psych Acute encephalopathy TIA   GI/Hepatic GERD  Medicated and Controlled,(+) Hepatitis -, CElevated LFTs   Endo/Other  negative endocrine ROS  Renal/GU negative Renal ROS     Musculoskeletal negative musculoskeletal ROS (+)   Abdominal   Peds  Hematology  (+) Blood dyscrasia (Hb 7.0), anemia ,   Anesthesia Other Findings   Reproductive/Obstetrics                           Anesthesia Physical Anesthesia Plan  ASA: III  Anesthesia Plan: General   Post-op Pain Management:    Induction: Intravenous  PONV Risk Score and Plan: 1 and Ondansetron and Dexamethasone  Airway Management Planned: Oral ETT  Additional Equipment: None  Intra-op Plan:   Post-operative Plan: Extubation in OR  Informed Consent: I have reviewed the patients History and Physical, chart, labs and discussed the procedure including the risks, benefits and alternatives for the proposed anesthesia with the patient or authorized representative who has indicated his/her understanding and acceptance.     Dental advisory given, Consent reviewed with POA and History available from  chart only  Plan Discussed with: CRNA and Surgeon  Anesthesia Plan Comments: (Consent from patient's wife, Ellwood Dense, by telephone)       Anesthesia Quick Evaluation

## 2020-01-06 NOTE — Progress Notes (Addendum)
HEMATOLOGY-ONCOLOGY PROGRESS NOTE  SUBJECTIVE: The patient underwent multiple imaging studies earlier today including MRI of the brain with and without contrast, MRA of the head and neck, and MRV.  Results are all currently pending.  The patient's wife is at the bedside.  The patient has his eyes open and is staring at the ceiling the majority of the time.  He does not answer any questions today.  The patient's wife tells me that she has called hospice of [facility] already.  She would like to talk to them about their services.  REVIEW OF SYSTEMS:   Unable to obtain secondary to altered mental status.  I have reviewed the past medical history, past surgical history, social history and family history with the patient and they are unchanged from previous note.   PHYSICAL EXAMINATION: ECOG PERFORMANCE STATUS: 4 - Bedbound  Vitals:   01/06/20 1335 01/06/20 1350  BP: 109/63 114/65  Pulse: (!) 115 (!) 116  Resp: (!) 22 (!) 24  Temp: 98.2 F (36.8 C) 98.1 F (36.7 C)  SpO2: 98% 99%   Filed Weights   01/05/20 0412 01/06/20 0055 01/06/20 1140  Weight: 77.6 kg 76.7 kg 76.7 kg    Intake/Output from previous day: 10/13 0701 - 10/14 0700 In: 1712.7 [P.O.:300; I.V.:1412.7] Out: 1475 [Urine:1475]  GENERAL: The patient's eyes are open, appears tachypneic, does not interact NEURO: The patient's eyes are open, does not answer questions  LABORATORY DATA:  I have reviewed the data as listed CMP Latest Ref Rng & Units 01/04/2020 01/03/2020 01/02/2020  Glucose 70 - 99 mg/dL 102(H) 112(H) 98  BUN 6 - 20 mg/dL 17 9 13   Creatinine 0.61 - 1.24 mg/dL 0.82 0.80 0.76  Sodium 135 - 145 mmol/L 135 133(L) 134(L)  Potassium 3.5 - 5.1 mmol/L 3.9 4.0 3.7  Chloride 98 - 111 mmol/L 101 99 99  CO2 22 - 32 mmol/L 25 24 25   Calcium 8.9 - 10.3 mg/dL 7.9(L) 8.0(L) 8.1(L)  Total Protein 6.5 - 8.1 g/dL 5.0(L) - 5.7(L)  Total Bilirubin 0.3 - 1.2 mg/dL 1.1 - 0.9  Alkaline Phos 38 - 126 U/L 173(H) - 243(H)   AST 15 - 41 U/L 50(H) - 106(H)  ALT 0 - 44 U/L 98(H) - 193(H)    Lab Results  Component Value Date   WBC 7.2 01/04/2020   HGB 7.0 (L) 01/04/2020   HCT 23.6 (L) 01/04/2020   MCV 83.1 01/04/2020   PLT 219 01/04/2020   NEUTROABS 5.7 01/04/2020    DG Chest 1 View  Result Date: 01/04/2020 CLINICAL DATA:  59 year old male with altered mental status, respiratory distress. EXAM: CHEST  1 VIEW COMPARISON:  Portable chest and chest CT 01/01/2020 and earlier. FINDINGS: Portable AP semi upright view at 2251 hours. Stable lung volumes and mediastinal contours. Visualized tracheal air column is within normal limits. Allowing for portable technique the lungs are clear. No pneumothorax or pleural effusion. No acute osseous abnormality identified. IMPRESSION: Negative portable chest. Electronically Signed   By: Genevie Ann M.D.   On: 01/04/2020 23:06   CT Head Wo Contrast  Result Date: 01/01/2020 CLINICAL DATA:  Mental status change EXAM: CT HEAD WITHOUT CONTRAST TECHNIQUE: Contiguous axial images were obtained from the base of the skull through the vertex without intravenous contrast. COMPARISON:  MRI 12/16/2019, CT 12/15/2019 FINDINGS: Brain: No evidence of acute infarction, hemorrhage, hydrocephalus, extra-axial collection, visible mass lesion or mass effect. Basal cisterns are patent. Borderline low positioning of the right cerebellar tonsil projecting approximately 5  mm below the foramen magnum but with otherwise morphologically normal appearance of the posterior fossa. Vascular: No hyperdense vessel or unexpected calcification. Skull: No calvarial fracture or suspicious osseous lesion. No scalp swelling or hematoma. Sinuses/Orbits: Paranasal sinuses are clear. Left mastoid effusion. Right mastoid air cells are predominantly clear. Partial pneumatization of the petrous apices. Middle ear cavities are clear. Included orbital structures are unremarkable. Other: Leftward nasal septal deviation with a contacting  left-sided nasal septal spur and prominent right concha bullosa. IMPRESSION: 1. No acute intracranial findings. 2. Low positioning of the right cerebellar tonsil projecting approximately 5 mm below the foramen magnum compatible with cerebellar tonsillar ectopia, nonspecific finding which can be asymptomatic or on the spectrum of Chiari 1 malformation. 3. Left mastoid effusion. 4. Leftward nasal septal deviation with a contacting left-sided nasal septal spur and prominent right concha bullosa. Electronically Signed   By: Lovena Le M.D.   On: 01/01/2020 17:34   CT Head Wo Contrast  Result Date: 12/15/2019 CLINICAL DATA:  Mental status change EXAM: CT HEAD WITHOUT CONTRAST TECHNIQUE: Contiguous axial images were obtained from the base of the skull through the vertex without intravenous contrast. COMPARISON:  09/09/2019 head CT. FINDINGS: Brain: No acute infarct or intracranial hemorrhage. No mass lesion. No midline shift, ventriculomegaly or extra-axial fluid collection. Vascular: No hyperdense vessel or unexpected calcification. Skull: Negative for fracture or focal lesion. Sinuses/Orbits: Normal orbits. Clear paranasal sinuses. No mastoid effusion. Other: None. IMPRESSION: No acute intracranial process. Electronically Signed   By: Primitivo Gauze M.D.   On: 12/15/2019 14:21   CT Soft Tissue Neck W Contrast  Result Date: 01/01/2020 CLINICAL DATA:  Lymphadenopathy.  Abnormal ultrasound. EXAM: CT NECK WITH CONTRAST TECHNIQUE: Multidetector CT imaging of the neck was performed using the standard protocol following the bolus administration of intravenous contrast. CONTRAST:  72m OMNIPAQUE IOHEXOL 300 MG/ML  SOLN COMPARISON:  Soft tissue neck ultrasound 12/31/2019 FINDINGS: Pharynx and larynx: Study is mildly degraded by patient motion throughout. No focal mucosal or submucosal lesions are present. Nasopharynx is clear. Soft palate and tongue base are within normal limits. Epiglottis is normal. Hypopharynx  is within normal limits. Vocal cord midline insert metric. The trachea is normal. Salivary glands: The submandibular and parotid glands and ducts are within normal limits. Thyroid: Negative Lymph nodes: Extensive left-sided adenopathy is present. Left submandibular node or conglomeration of nodes measures 3.6 x 1.9 x 1.7 cm. Enlarged level 2 and level 3 lymph nodes are present the largest left level 2 lymph node measures at least 2.5 x 1.8 cm. No primary lesion is identified. Vascular: No focal vascular lesions or significant atherosclerotic changes present. Limited intracranial: Within normal limits. Visualized orbits: The globes and orbits are within normal limits. Mastoids and visualized paranasal sinuses: The paranasal sinuses and mastoid air cells are clear. Skeleton: Degenerative changes are most evident C5-6 and C6-7. Straightening of the normal cervical lordosis is present. No focal lytic or blastic lesion is present. Upper chest: The lung apices are clear. The thoracic inlet is within normal limits. IMPRESSION: 1. Extensive left-sided adenopathy without a primary lesion. This is concerning for malignancy. This may represent metastatic disease of unknown primary versus lymphoma or leukemia. 2. No primary lesion is identified. 3. Degenerative changes of the cervical spine are most evident C5-6 and C6-7. Electronically Signed   By: CSan MorelleM.D.   On: 01/01/2020 17:42   CT ANGIO CHEST PE W OR WO CONTRAST  Result Date: 01/05/2020 CLINICAL DATA:  Chest pain and elevated  D-dimer EXAM: CT ANGIOGRAPHY CHEST WITH CONTRAST TECHNIQUE: Multidetector CT imaging of the chest was performed using the standard protocol during bolus administration of intravenous contrast. Multiplanar CT image reconstructions and MIPs were obtained to evaluate the vascular anatomy. CONTRAST:  135m OMNIPAQUE IOHEXOL 350 MG/ML SOLN COMPARISON:  01/01/2020 chest CT FINDINGS: Cardiovascular: Suboptimal opacification of the  pulmonary arteries due to bolus timing, limiting evaluation assessment for pulmonary embolus. Timing was inhibited by patient difficulty following the technologist's instructions. The size of the main pulmonary artery is enlarged, measuring 3.6 cm. Heart size is normal, with no pericardial effusion. The course and caliber of the aorta are normal. There is no atherosclerotic calcification. Opacification decreased due to pulmonary arterial phase contrast bolus timing. Mediastinum/Nodes: Multiple enlarged right axillary lymph nodes measuring up to 2.6 cm. Lungs/Pleura: There are multiple small nodules in the right lung, the largest of which measures for mm, unchanged from the recent chest CT of 01/01/2020 (series 11, image 61). Unchanged area of ground glass opacity in the posterior right upper lobe. There is right basilar atelectasis. Upper Abdomen: Contrast bolus timing is not optimized for evaluation of the abdominal organs. Hepatosplenomegaly Musculoskeletal: No chest wall abnormality. No bony spinal canal stenosis. Review of the MIP images confirms the above findings. IMPRESSION: 1. Suboptimal opacification of the pulmonary arteries due to bolus timing, limiting evaluation for pulmonary embolus. 2. Multiple enlarged right axillary lymph nodes, measuring up to 2.6 cm. This may indicate lymphoproliferative disease such as lymphoma, or reaction to a process in the right upper extremity. 3. Enlarged main pulmonary artery, consistent with pulmonary hypertension. 4. Unchanged appearance of ground glass opacity in the posterior right upper lobe, nonspecific. Electronically Signed   By: KUlyses JarredM.D.   On: 01/05/2020 02:55   MR ANGIO HEAD WO CONTRAST  Result Date: 01/06/2020 CLINICAL DATA:  Delirium. EXAM: MRI HEAD WITHOUT AND WITH CONTRAST MRA HEAD WITHOUT CONTRAST TECHNIQUE: Multiplanar, multiecho pulse sequences of the brain and surrounding structures were obtained without and with intravenous contrast.  Angiographic images of the head were obtained using MRA technique without contrast. CONTRAST:  7.661mGADAVIST GADOBUTROL 1 MMOL/ML IV SOLN COMPARISON:  Brain MRI 01/02/2020. FINDINGS: MRI HEAD FINDINGS Cerebral volume is normal. There are now at least 14 punctate foci of restricted diffusion consistent with acute/early subacute infarcts (see annotations on series 2). These foci are predominantly scattered within the bilateral cerebral hemispheres, although there is also a focus within the right middle cerebellar peduncle. Several of these lesions are new as compared to the prior brain MRI of 01/02/2020 (previously numbering 7). Additionally, there has been significant interval progression of now extensive patchy cortical/subcortical T2/FLAIR hyperintensity throughout both cerebral hemispheres. Foci of T2/FLAIR hyperintensity are also now present within the pons, right middle cerebellar peduncle and right cerebellar white matter (series 7, image 6) (series 7, image 5). Also significantly progressed from the prior MRI, there are numerous scattered microhemorrhages and petechial hemorrhage within the supratentorial and infratentorial brain. Additionally, multifocal abnormal sulcal enhancement is questioned on the T1 weighted postcontrast sequence (for instance as seen on series 17, image 12). No evidence of intracranial mass. No extra-axial fluid collection. No midline shift. Vascular: Reported below. Skull and upper cervical spine: No focal marrow lesion. Sinuses/Orbits: Visualized orbits show no acute finding. Mild ethmoid and sphenoid sinus mucosal thickening. Large bilateral mastoid effusions. MRA HEAD FINDINGS The intracranial internal carotid arteries are patent. The M1 middle cerebral arteries are patent without significant stenosis. No M2 proximal branch occlusion or high-grade proximal  stenosis is identified. The anterior cerebral arteries are patent. The intracranial vertebral arteries are patent. The basilar  artery is patent. The posterior cerebral arteries are patent. Posterior communicating arteries are present bilaterally. No intracranial aneurysm is identified. These results will be called to the ordering clinician or representative by the Radiologist Assistant, and communication documented in the PACS or [employer]. IMPRESSION: MRI brain: There are now at least 14 punctate acute/early subacute infarcts which are predominantly within the bilateral cerebral hemispheres, with an additional infarct in the right middle cerebellar peduncle. Several of these lesions are new as compared to 01/02/2020 (previously numbering 7). Additionally, there has been significant interval progression of now extensive patchy cortical/subcortical T2/FLAIR hyperintensity throughout both cerebral hemispheres, as well as new small foci of T2/FLAIR hyperintensity within the pons, right middle cerebellar peduncle and right cerebellar white matter. There are now extensive multifocal microhemorrhages and petechial hemorrhage within the supratentorial and infratentorial brain, significantly progressed. Multifocal abnormal sulcal enhancement is also questioned. Favored differential considerations include vasculitis, autoimmune cerebritis and infectious cerebritis. Given the numerous small infarcts, a superimposed embolic process is difficult to exclude. Demyelinating disease is considered less likely. Correlation with CSF analysis is recommended. MRA head: 1. No intracranial large vessel occlusion or proximal high-grade arterial stenosis 2. No significant vessel irregularity is appreciated. 3. Catheter based angiography may be helpful for further evaluation of the small arterial vessels. Electronically Signed   By: Kellie Simmering DO   On: 01/06/2020 14:53   MR BRAIN WO CONTRAST  Result Date: 01/02/2020 CLINICAL DATA:  Delirium.  Confusion.  Mental status changes. EXAM: MRI HEAD WITHOUT CONTRAST TECHNIQUE: Multiplanar, multiecho pulse  sequences of the brain and surrounding structures were obtained without intravenous contrast. COMPARISON:  Head CT 01/01/2020.  MRI 12/16/2019. FINDINGS: Brain: 7 punctate foci restricted diffusion seen scattered within the cortical and subcortical brain of both hemispheres consistent with small infarctions. No large confluent area of restricted diffusion. Within both cerebral hemispheres, more extensive on the left than the right, there are areas of abnormal T2 and FLAIR signal affecting the cortical and subcortical brain. This is most notable at the left frontal vertex and in the left lateral temporal lobe. The last study was motion degraded and abbreviated, but the FLAIR and T2 imaging is of reasonable quality in those changes do not appear to have been present at that time. There is no evidence of mass, hemorrhage, hydrocephalus or extra-axial collection. The differential diagnosis is that of sequela of ischemic disease versus is demyelinating disease versus autoimmune disease or vasculitis. No sign of hemorrhage, hydrocephalus or extra-axial collection. Small focus of hemosiderin in the left parietal cortical and subcortical brain is 1 exception to that but is of uncertain chronicity. I think one can appreciate some susceptibility effect in this location on the diffusion imaging late September. Vascular: Major vessels at the base of the brain show flow. Skull and upper cervical spine: Negative Sinuses/Orbits: Sinuses are clear.  Orbits negative. Other: Small mastoid effusions. IMPRESSION: 1. 7 punctate foci of restricted diffusion seen scattered within the cortical and subcortical brain of both hemispheres consistent with small acute infarctions. Other areas indistinct T2 and FLAIR signal affecting the cortical and subcortical brain, separate from those areas of small restricted diffusion. This process is most notable at the left frontal vertex and in the left lateral temporal lobe. The differential diagnosis  is that of ischemic disease versus demyelinating disease versus autoimmune cerebritis versus infectious cerebritis versus vasculitis. Electronically Signed   By: Elta Guadeloupe  Shogry M.D.   On: 01/02/2020 09:39   MR BRAIN WO CONTRAST  Result Date: 12/16/2019 CLINICAL DATA:  Provided history: Mental status change, unknown cause. Additional provided: Altered and afebrile, tachycardic. EXAM: MRI HEAD WITHOUT CONTRAST TECHNIQUE: Multiplanar, multiecho pulse sequences of the brain and surrounding structures were obtained without intravenous contrast. COMPARISON:  Noncontrast head CT 12/15/2019. FINDINGS: Brain: The patient was unable to tolerate the full examination. As a result, only axial and coronal diffusion-weighted sequences, an axial T1 weighted sequence and an axial T2 weighted sequence could be obtained. The sagittal T1 weighted and axial T2 weighted sequences are moderately motion degraded. Cerebral volume is normal for age. There is no acute infarct. Within described limitations, there is no evidence of an intracranial mass. No extra-axial fluid collection is identified. No midline shift. Vascular: Expected proximal arterial flow voids. Skull and upper cervical spine: Within described limitations, no focal marrow lesion is identified. Sinuses/Orbits: Visualized orbits show no acute finding. Mild ethmoid sinus mucosal thickening. Trace left mastoid effusion. IMPRESSION: Prematurely terminated and motion degraded examination as described. The diffusion-weighted imaging is of good quality. There is no evidence of acute infarct. Mild ethmoid sinus mucosal thickening. Trace left mastoid effusion. Electronically Signed   By: Kellie Simmering DO   On: 12/16/2019 10:29   MR BRAIN W WO CONTRAST  Result Date: 01/06/2020 CLINICAL DATA:  Delirium. EXAM: MRI HEAD WITHOUT AND WITH CONTRAST MRA HEAD WITHOUT CONTRAST TECHNIQUE: Multiplanar, multiecho pulse sequences of the brain and surrounding structures were obtained without  and with intravenous contrast. Angiographic images of the head were obtained using MRA technique without contrast. CONTRAST:  7.27m GADAVIST GADOBUTROL 1 MMOL/ML IV SOLN COMPARISON:  Brain MRI 01/02/2020. FINDINGS: MRI HEAD FINDINGS Cerebral volume is normal. There are now at least 14 punctate foci of restricted diffusion consistent with acute/early subacute infarcts (see annotations on series 2). These foci are predominantly scattered within the bilateral cerebral hemispheres, although there is also a focus within the right middle cerebellar peduncle. Several of these lesions are new as compared to the prior brain MRI of 01/02/2020 (previously numbering 7). Additionally, there has been significant interval progression of now extensive patchy cortical/subcortical T2/FLAIR hyperintensity throughout both cerebral hemispheres. Foci of T2/FLAIR hyperintensity are also now present within the pons, right middle cerebellar peduncle and right cerebellar white matter (series 7, image 6) (series 7, image 5). Also significantly progressed from the prior MRI, there are numerous scattered microhemorrhages and petechial hemorrhage within the supratentorial and infratentorial brain. Additionally, multifocal abnormal sulcal enhancement is questioned on the T1 weighted postcontrast sequence (for instance as seen on series 17, image 12). No evidence of intracranial mass. No extra-axial fluid collection. No midline shift. Vascular: Reported below. Skull and upper cervical spine: No focal marrow lesion. Sinuses/Orbits: Visualized orbits show no acute finding. Mild ethmoid and sphenoid sinus mucosal thickening. Large bilateral mastoid effusions. MRA HEAD FINDINGS The intracranial internal carotid arteries are patent. The M1 middle cerebral arteries are patent without significant stenosis. No M2 proximal branch occlusion or high-grade proximal stenosis is identified. The anterior cerebral arteries are patent. The intracranial vertebral  arteries are patent. The basilar artery is patent. The posterior cerebral arteries are patent. Posterior communicating arteries are present bilaterally. No intracranial aneurysm is identified. These results will be called to the ordering clinician or representative by the Radiologist Assistant, and communication documented in the PACS or [employer] IMPRESSION: MRI brain: There are now at least 14 punctate acute/early subacute infarcts which are predominantly within the bilateral  cerebral hemispheres, with an additional infarct in the right middle cerebellar peduncle. Several of these lesions are new as compared to 01/02/2020 (previously numbering 7). Additionally, there has been significant interval progression of now extensive patchy cortical/subcortical T2/FLAIR hyperintensity throughout both cerebral hemispheres, as well as new small foci of T2/FLAIR hyperintensity within the pons, right middle cerebellar peduncle and right cerebellar white matter. There are now extensive multifocal microhemorrhages and petechial hemorrhage within the supratentorial and infratentorial brain, significantly progressed. Multifocal abnormal sulcal enhancement is also questioned. Favored differential considerations include vasculitis, autoimmune cerebritis and infectious cerebritis. Given the numerous small infarcts, a superimposed embolic process is difficult to exclude. Demyelinating disease is considered less likely. Correlation with CSF analysis is recommended. MRA head: 1. No intracranial large vessel occlusion or proximal high-grade arterial stenosis 2. No significant vessel irregularity is appreciated. 3. Catheter based angiography may be helpful for further evaluation of the small arterial vessels. Electronically Signed   By: Kellie Simmering DO   On: 01/06/2020 14:53   US SOFT TISSUE HEAD & NECK (NON-THYROID)  Result Date: 12/31/2019 CLINICAL DATA:  Left-sided cervical lymphadenopathy. Weakness and weight loss. EXAM:  ULTRASOUND OF HEAD/NECK SOFT TISSUES TECHNIQUE: Ultrasound examination of the head and neck soft tissues was performed in the area of clinical concern. COMPARISON:  None. FINDINGS: Patient's palpable area of concern within the left neck correlates with several clustered potentially pathologically enlarged cervical lymph nodes (representative image 28). Dominant left-sided cervical lymph node measures 1.4 cm in greatest short axis diameter with obliteration of the fatty hilum (image 30). IMPRESSION: Patient's palpable area of concern within the left neck correlates with left cervical lymphadenopathy, nonspecific and while potentially infectious or inflammatory in etiology, malignancy is not excluded on the basis of this examination. Clinical correlation is advised. Further evaluation with contrast-enhanced neck CT could be performed as indicated. These results will be called to the ordering clinician or representative by the Radiologist Assistant, and communication documented in the PACS or [employer]. Electronically Signed   By: Sandi Mariscal M.D.   On: 12/31/2019 15:11   CT CHEST ABDOMEN PELVIS W CONTRAST  Result Date: 01/01/2020 CLINICAL DATA:  Lymphadenopathy in the neck. Concern for metastatic disease. EXAM: CT CHEST, ABDOMEN, AND PELVIS WITH CONTRAST TECHNIQUE: Multidetector CT imaging of the chest, abdomen and pelvis was performed following the standard protocol during bolus administration of intravenous contrast. CONTRAST:  191m OMNIPAQUE IOHEXOL 300 MG/ML  SOLN COMPARISON:  None. FINDINGS: CT CHEST FINDINGS Cardiovascular: The heart size is unremarkable. There is a trace pericardial effusion. Minimal atherosclerotic changes are noted of the thoracic aorta. There is no dissection. There is no large centrally located pulmonary embolism. Detection of smaller pulmonary emboli is limited by contrast bolus timing and technique. Mediastinum/Nodes: -- No mediastinal lymphadenopathy. --there are few mildly  prominent hilar lymph nodes. For example on the left there is a 9 mm hilar lymph node (axial series 3, image 27). --there is no significant axillary adenopathy on the left. Multiple pathologically enlarged axillary lymph nodes are noted on the right measuring up to approximately 2.7 x 5.6 cm. -- No supraclavicular lymphadenopathy. -- Normal thyroid gland where visualized. -  Unremarkable esophagus. Lungs/Pleura: There is a 3 mm pulmonary nodule in the right upper lobe (axial series 4, image 22). There is an additional 3 mm pulmonary nodule in the right upper lobe (axial series 4, image 36). There is a ground-glass airspace opacity in the right upper lobe measuring approximately 2.2 cm (axial series 4, image 49). There  is a 5-6 mm pulmonary nodule in the right upper lobe (axial series 4, image 70). There is a 6 mm pulmonary nodule in the right middle lobe (axial series 4, image 104). There is atelectasis in the right lower lobe. Musculoskeletal: No chest wall abnormality. No bony spinal canal stenosis. Mild bilateral gynecomastia is noted. CT ABDOMEN PELVIS FINDINGS Hepatobiliary: There is hepatic steatosis. The liver is enlarged measuring approximately 19 cm craniocaudad. There is no discrete hepatic mass. Normal gallbladder.There is no biliary ductal dilation. Pancreas: Normal contours without ductal dilatation. No peripancreatic fluid collection. Spleen: The spleen is significantly enlarged measuring approximately 19.4 cm craniocaudad. Adrenals/Urinary Tract: --Adrenal glands: Unremarkable. --Right kidney/ureter: There is a low-attenuation 2.1 cm structure in the right kidney measuring approximately 21 Hounsfield units. There is no hydronephrosis. --Left kidney/ureter: No hydronephrosis or radiopaque kidney stones. --Urinary bladder: Unremarkable. Stomach/Bowel: --Stomach/Duodenum: No hiatal hernia or other gastric abnormality. Normal duodenal course and caliber. --Small bowel: Unremarkable. --Colon: There are few  scattered colonic diverticula without CT evidence for diverticulitis. --Appendix: Normal. Vascular/Lymphatic: Atherosclerotic calcification is present within the non-aneurysmal abdominal aorta, without hemodynamically significant stenosis. --No retroperitoneal lymphadenopathy. --No mesenteric lymphadenopathy. --there is a pathologically enlarged left inguinal lymph node measuring approximately 2.5 cm (axial series 3, image 124). Reproductive: The prostate gland is enlarged. Other: No ascites or free air. The abdominal wall is normal. Musculoskeletal. No acute displaced fractures. IMPRESSION: 1. Pathologically enlarged lymph nodes in the right axilla and left groin in addition to hepatosplenomegaly is concerning for a lymphoproliferative disorder such as lymphoma. The lymph nodes in the right axilla and left groin are readily amenable to percutaneous biopsy. 2. Hepatic steatosis. 3. Indeterminate 2.1 cm nodule in the right kidney. This is favored to represent a proteinaceous or hemorrhagic cyst. Follow-up with a nonemergent renal ultrasound as an outpatient is recommended for confirmation. 4. Multiple indeterminate pulmonary nodules are noted measuring up to approximately 6 mm. Follow-up non-contrast CT recommended at 3-6 months to confirm persistence. If unchanged, and solid component remains <6 mm, annual CT is recommended until 5 years of stability has been established. If persistent these nodules should be considered highly suspicious if the solid component of the nodule is 6 mm or greater in size and enlarging. This recommendation follows the consensus statement: Guidelines for Management of Incidental Pulmonary Nodules Detected on CT Images: From the Fleischner Society 2017; Radiology 2017; 284:228-243. 5. There is a subtle 2.2 cm ground-glass opacity in the right upper lobe as detailed above. This is favored to represent an infectious or inflammatory process. Attention on follow-up CT is recommended. Aortic  Atherosclerosis (ICD10-I70.0). Electronically Signed   By: Constance Holster M.D.   On: 01/01/2020 20:51   DG Chest Portable 1 View  Result Date: 01/01/2020 CLINICAL DATA:  Cough EXAM: PORTABLE CHEST 1 VIEW COMPARISON:  December 15, 2019 FINDINGS: The cardiomediastinal silhouette is unchanged in contour.RIGHT rounded retrocardiac opacity may reflect a hiatal hernia. No pleural effusion. No pneumothorax. No acute pleuroparenchymal abnormality. Visualized abdomen is unremarkable. Multilevel degenerative changes of the thoracic spine. IMPRESSION: No acute cardiopulmonary abnormality. Electronically Signed   By: Valentino Saxon MD   On: 01/01/2020 16:32   DG Chest Port 1 View  Result Date: 12/15/2019 CLINICAL DATA:  Sepsis EXAM: PORTABLE CHEST 1 VIEW COMPARISON:  None. FINDINGS: The heart size and mediastinal contours are within normal limits. No focal airspace consolidation, pleural effusion, or pneumothorax. The visualized skeletal structures are unremarkable. IMPRESSION: No active disease. Electronically Signed   By: Davina Poke D.O.  On: 12/15/2019 14:03   EEG adult  Result Date: 01/03/2020 Lora Havens, MD     01/03/2020  2:23 PM Patient Name: Anzel Kearse MRN: [id number] Epilepsy Attending: Lora Havens Referring Physician/Provider: Dr. Charlynne Cousins Date: 01/03/2020 Duration: 26.20 mins Patient history: 59 year old male with worsening encephalopathy which has progressed over the last 6 to 7 weeks.  EEG to evaluate for seizures. Level of alertness: Awake, asleep AEDs during EEG study: None Technical aspects: This EEG study was done with scalp electrodes positioned according to the 10-20 International system of electrode placement. Electrical activity was acquired at a sampling rate of 500Hz  and reviewed with a high frequency filter of 70Hz  and a low frequency filter of 1Hz . EEG data were recorded continuously and digitally stored. Description: No clear posterior dominant  rhythm was seen.  EEG showed continued generalized polymorphic 3 to 6 Hz theta-delta slowing.  Hyperventilation and photic stimulation were not performed.   ABNORMALITY -Continuous slow, generalized IMPRESSION: This study is suggestive of moderate diffuse encephalopathy, nonspecific to etiology.  No seizures or epileptiform discharges were seen throughout the recording. Priyanka Barbra Sarks   [place] CORE BIOPSY (LYMPH NODES)  Result Date: 01/04/2020 INDICATION: 59 year old male with lymphadenopathy, concern for lymphoma. EXAM: ULTRASOUND GUIDED core BIOPSY OF right axillary lymph node MEDICATIONS: None. ANESTHESIA/SEDATION: Fentanyl 50 mcg IV; Versed 1 mg IV Moderate Sedation Time:  15 The patient was continuously monitored during the procedure by the interventional radiology nurse under my direct supervision. PROCEDURE: The procedure, risks, benefits, and alternatives were explained to the patient. Questions regarding the procedure were encouraged and answered. The patient understands and consents to the procedure. The anterior right axilla was prepped with chlorhexidine in a sterile fashion, and a sterile drape was applied covering the operative field. A sterile gown and sterile gloves were used for the procedure. Ultrasound evaluation demonstrated large right axillary lymph node. The procedure was planned. Local anesthesia was provided with 1% Lidocaine at the planned skin entry site as well as deeper along the capsule of the lymph node. A small skin nick was made. A 17 gauge, 10 cm introducer needle was directed under ultrasound guidance into the periphery of the enlarged lymph node. Next, a total of 4 18 gauge core biopsies were obtained. The samples were placed in saline and sent to Pathology. The introducer needle was removed. Postprocedure ultrasound demonstrated no evidence of hematoma or fluid collection in the right axilla. Hemostasis was obtained with manual compression. The patient tolerated the procedure  well and was transferred to the floor in stable condition. COMPLICATIONS: None immediate. FINDINGS: Right axillary lymphadenopathy. IMPRESSION: Technically successful ultrasound-guided core biopsy of pathologically enlarged right axillary lymph node. Ruthann Cancer, MD [facility] Radiology Electronically Signed   By: Ruthann Cancer MD   On: 01/04/2020 17:25   US Abdomen Limited RUQ  Result Date: 12/15/2019 CLINICAL DATA:  Transaminitis EXAM: ULTRASOUND ABDOMEN LIMITED RIGHT UPPER QUADRANT COMPARISON:  None. FINDINGS: Gallbladder: No gallstones or wall thickening visualized. No sonographic Murphy sign noted by sonographer. Common bile duct: Diameter: 4 mm Liver: The liver is enlarged measuring 18.7 cm in the midclavicular line. There is diffuse increased liver echotexture consistent with hepatic steatosis. No focal liver abnormality. Portal vein is patent on color Doppler imaging with normal direction of blood flow towards the liver. Other: Right kidney is unremarkable measuring 12.4 cm. No free fluid. IMPRESSION: 1. Hepatomegaly, with diffuse hepatic steatosis. 2. Otherwise unremarkable exam. Electronically Signed   By: Legrand Como  Owens Shark M.D.   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 Extensive left-sided adenopathy without a primary lesion. This is concerning for malignancy. This may represent metastatic disease of unknown primary versus lymphoma or leukemia. 2. No primary lesion is identified. 3. Degenerative changes of the cervical spine are most evident C5-6 and C6-7." -01/01/2020 CT chest/abdomen/pelvis-"1. Pathologically enlarged lymph nodes in the right axilla and left groin in addition to hepatosplenomegaly is concerning for a lymphoproliferative  disorder such as lymphoma. The lymph nodes in the right axilla and left groin are readily amenable to percutaneous biopsy. 2. Hepatic steatosis. 3. Indeterminate 2.1 cm nodule in the right kidney. This is favored to represent a proteinaceous or hemorrhagic cyst. Follow-up with a nonemergent renal ultrasound as an outpatient is recommended for confirmation. 4. Multiple indeterminate pulmonary nodules are noted measuring up to approximately 6 mm. Follow-up non-contrast CT recommended at 3-6 months to confirm persistence. If unchanged, and solid component remains <6 mm, annual CT is recommended until 5 years of stability has been established. If persistent these nodules should be considered highly suspicious if the solid component of the nodule is 6 mm or greater in size and enlarging. This recommendation follows the consensus statement: Guidelines for Management of Incidental Pulmonary Nodules Detected on CT Images: From the Fleischner Society 2017; Radiology 2017; 284:228-243. 5. There is a subtle 2.2 cm ground-glass opacity in the right upper lobe as detailed above. This is favored to represent an infectious or inflammatory process. Attention on follow-up CT is recommended." -01/01/2020 [facility] 282 -01/04/2020-ultrasound-guided biopsy of right axillary lymph node-pathology consistent with poorly differentiated malignancy, morphologic features favor poorly differentiated non-small cell carcinoma.  2) normocytic anemia -CBC from 01/01/2020 showed a hemoglobin of 7.8 hematocrit 26.7.  MCV was normal at 85.9. -01/01/2020-ferritin 3097, iron 12, TIBC 193, percent saturation 6%, vitamin N56 213, folic acid 6.2 -Likely due to underlying malignancy  3) altered mental status/acute metabolic encephalopathy -08/65/7846 MRI brain- "1. 7 punctate foci of restricted diffusion seen scattered within the cortical and subcortical brain of both hemispheres consistent with small acute infarctions. Other areas indistinct T2 and  FLAIR signal affecting the cortical and subcortical brain, separate from those areas of small restricted diffusion. This process is most notable at the left frontal vertex and in the left lateral temporal lobe. The differential diagnosis is that of ischemic disease versus demyelinating disease versus autoimmune cerebritis versus infectious cerebritis versus vasculitis." -01/06/2020 MRI of the brain with and without contrast, MRA of the brain and neck, and MRV of the brain performed and results pending  4) transaminitis -Unclear etiology,?  Infiltrative involvement of liver by malignancy  PLAN: -Biopsy results were discussed with the patient and with his daughter by telephone.  We discussed the incurable nature of his cancer.  Furthermore, I expressed concern about his poor performance status which would preclude treatment with aggressive chemotherapy at this time.  We discussed that treatment may cause more harm than be beneficial.  We will however go ahead and send tissue for PD-L1 and foundation 1 testing.  Depending on how the patient recovers, he may be a candidate for targeted therapy pending these results. -Recommend continued monitoring of his hemoglobin and transfuse PRBCs for hemoglobin less than 7.  CBC from today is currently pending. -Await results of MRI, MRV, and MRA.  Cause of altered  mental status is unclear.  LP per neurology. ?  Leptomeningeal carcinomatosis as a possible cause of his AMS. -Palliative care consult has been requested and is currently pending for goals of care discussion.   LOS: 5 days   Mikey Bussing, DNP, AGPCNP-BC, AOCNP 01/06/20   ADDENDUM  .Patient was Personally and independently interviewed, examined and relevant elements of the history of present illness were reviewed in details and an assessment and plan was created. All elements of the patient's history of present illness , assessment and plan were discussed in details with Mikey Bussing, DNP,  AGPCNP-BC, AOCNP . The above documentation reflects our combined findings assessment and plan.  SURGICAL PATHOLOGY  CASE: [id number]  PATIENT: Maryella Shivers  Surgical Pathology Report   Clinical History: Lymphadenopathy (nt)   FINAL MICROSCOPIC DIAGNOSIS:   A. LYMPH NODE, RIGHT AXILLARY, NEEDLE CORE BIOPSY:  - Poorly differentiated malignancy.  - See comment.   COMMENT:  The morphologic features favor poorly differentiated non-small cell  carcinoma. Immunohistochemistry will be performed and reported as an  addendum.   Sullivan Lone MD MS   PLan -further evaluation based on patients/family's goals of care -MRI with significant progressive brain findings ? Metastatic disease vs leptomeningeal disease vs paraneoplastic changes. -final pathology IHC pending. -not unreasonable considering findings of aggressive widespread poor differentiated carcinoma (likely non small cell lung carcinoma as per pathology) with poor performance status to pursue best supportive cares through hospice.  Sullivan Lone MD MS

## 2020-01-06 NOTE — Progress Notes (Signed)
[facility] PROGRESS NOTE    Progress Note  Brian Ochoa  RWE:[id number] DOB: [birth year] DOA: 01/01/2020 PCP: Aletha Halim., PA-C     Brief Narrative:   Brian Ochoa is an 59 y.o. male past medical history significant of TIA with a recent admission for encephalopathy discharged on 12/22/2019 presents again with altered mental status during previous hospitalization during her previous hospitalization she was found to be febrile treated for infectious etiology and encephalopathy resolved.  Of admission he became more confused so she called EMS on arrival she was found to have a blood pressure of 96/60 was given a bolus the wife also noted some lymphadenopathy in his neck showed Extensive left-sided adenopathy without a primary lesion.  CT of the abdomen pelvis showed extensive lymphadenopathy in the right axilla and left groin. IR was consulted and is planning for biopsy on 01/04/2020.  Also his MRI of the brain showed 2 small what appeared to be stroke percent T2 flaring in the left frontal and temporal lobe, neurology was consulted who recommended MRI of the brain with contrast MRV and EEG.  Assessment/Plan:   Acute metabolic encephalopathy: Work-up negative for sepsis, according to the family he has had a change in mental status for 8-week he has been treated with antibiotics as an outpatient.  Has remained negative till date, SARS-CoV-2 PCR and influenza PCR negative. MRI of the brain 01/02/2020 showed 7 mm indurated foci in both hemispheres concerning for small acute infarct, there is T2 flair signal in the left frontal and temporal lobes. Neurology was consulted recommended an MRI of the brain with contrast MRV are pending as he will require sedation with anesthesia which is still pending.  Neurology also recommended thiamine replacement. EEG moderate diffuse encephalopathy nonspecific. LP and CSF studies.  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 Still significantly confused, placed n.p.o. change medications to IV.  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 Ferritin of 3000 TIBC of 193 iron of 12.  B12 465. Hemoglobin continues to be stable, mildly tachycardic at 107.  Elevated LFTs: More likely due to infiltrated lymphoproliferative disorder.  Increasing respiratory rate: He is status post biopsy he might of aspirated, has no leukocytosis has remained afebrile we will continue to monitor fever curve. Repeated CT angio of the chest showed no PE unchanged groundglass opacities with lymphadenopathy.   DVT prophylaxis: lovenox Family Communication:son Status is: Inpatient  Remains inpatient appropriate because:Hemodynamically unstable   Dispo: The patient is from: Home              Anticipated d/c is to: Home              Anticipated d/c date is: > 3 days              Patient currently is not medically stable to d/c.     Code Status:     Code Status Orders  (From admission, onward)         Start     Ordered   01/01/20 2012  Full code  Continuous        01/01/20 2016        Code Status History    Date Active Date Inactive Code Status Order  ID Comments User Context   12/15/2019 2332 12/22/2019 2100 Full Code [id number]  [name], MD ED   Advance Care Planning Activity        IV Access:    Peripheral IV   Procedures and diagnostic studies:   DG Chest 1 View  Result Date: 01/04/2020 CLINICAL DATA:  60 year old male with altered mental status, respiratory distress. EXAM: CHEST  1 VIEW COMPARISON:  Portable chest and chest CT 01/01/2020 and earlier. FINDINGS: Portable AP  semi upright view at 2251 hours. Stable lung volumes and mediastinal contours. Visualized tracheal air column is within normal limits. Allowing for portable technique the lungs are clear. No pneumothorax or pleural effusion. No acute osseous abnormality identified. IMPRESSION: Negative portable chest. Electronically Signed   By: Genevie Ann M.D.   On: 01/04/2020 23:06   CT ANGIO CHEST PE W OR WO CONTRAST  Result Date: 01/05/2020 CLINICAL DATA:  Chest pain and elevated D-dimer EXAM: CT ANGIOGRAPHY CHEST WITH CONTRAST TECHNIQUE: Multidetector CT imaging of the chest was performed using the standard protocol during bolus administration of intravenous contrast. Multiplanar CT image reconstructions and MIPs were obtained to evaluate the vascular anatomy. CONTRAST:  164mL OMNIPAQUE IOHEXOL 350 MG/ML SOLN COMPARISON:  01/01/2020 chest CT FINDINGS: Cardiovascular: Suboptimal opacification of the pulmonary arteries due to bolus timing, limiting evaluation assessment for pulmonary embolus. Timing was inhibited by patient difficulty following the technologist's instructions. The size of the main pulmonary artery is enlarged, measuring 3.6 cm. Heart size is normal, with no pericardial effusion. The course and caliber of the aorta are normal. There is no atherosclerotic calcification. Opacification decreased due to pulmonary arterial phase contrast bolus timing. Mediastinum/Nodes: Multiple enlarged right axillary lymph nodes measuring up to 2.6 cm. Lungs/Pleura: There are multiple small nodules in the right lung, the largest of which measures for mm, unchanged from the recent chest CT of 01/01/2020 (series 11, image 61). Unchanged area of ground glass opacity in the posterior right upper lobe. There is right basilar atelectasis. Upper Abdomen: Contrast bolus timing is not optimized for evaluation of the abdominal organs. Hepatosplenomegaly Musculoskeletal: No chest wall abnormality. No bony spinal canal stenosis. Review of the  MIP images confirms the above findings. IMPRESSION: 1. Suboptimal opacification of the pulmonary arteries due to bolus timing, limiting evaluation for pulmonary embolus. 2. Multiple enlarged right axillary lymph nodes, measuring up to 2.6 cm. This may indicate lymphoproliferative disease such as lymphoma, or reaction to a process in the right upper extremity. 3. Enlarged main pulmonary artery, consistent with pulmonary hypertension. 4. Unchanged appearance of ground glass opacity in the posterior right upper lobe, nonspecific. Electronically Signed   By: Ulyses Jarred M.D.   On: 01/05/2020 02:55   [place] CORE BIOPSY (LYMPH NODES)  Result Date: 01/04/2020 INDICATION: 59 year old male with lymphadenopathy, concern for lymphoma. EXAM: ULTRASOUND GUIDED core BIOPSY OF right axillary lymph node MEDICATIONS: None. ANESTHESIA/SEDATION: Fentanyl 50 mcg IV; Versed 1 mg IV Moderate Sedation Time:  15 The patient was continuously monitored during the procedure by the interventional radiology nurse under my direct supervision. PROCEDURE: The procedure, risks, benefits, and alternatives were explained to the patient. Questions regarding the procedure were encouraged and answered. The patient understands and consents to the procedure. The anterior right axilla was prepped with chlorhexidine in a sterile fashion, and a sterile drape was applied covering the operative field. A sterile gown and sterile gloves were used for the procedure. Ultrasound evaluation demonstrated large right axillary lymph node. The procedure was planned. Local anesthesia  was provided with 1% Lidocaine at the planned skin entry site as well as deeper along the capsule of the lymph node. A small skin nick was made. A 17 gauge, 10 cm introducer needle was directed under ultrasound guidance into the periphery of the enlarged lymph node. Next, a total of 4 18 gauge core biopsies were obtained. The samples were placed in saline and sent to Pathology. The  introducer needle was removed. Postprocedure ultrasound demonstrated no evidence of hematoma or fluid collection in the right axilla. Hemostasis was obtained with manual compression. The patient tolerated the procedure well and was transferred to the floor in stable condition. COMPLICATIONS: None immediate. FINDINGS: Right axillary lymphadenopathy. IMPRESSION: Technically successful ultrasound-guided core biopsy of pathologically enlarged right axillary lymph node. Ruthann Cancer, MD [facility] Electronically Signed   By: Ruthann Cancer MD   On: 01/04/2020 17:25     Medical Consultants:    None.  Anti-Infectives:  None  Subjective:    Bridger Pizzi continues to be lethargic  Objective:    Vitals:   01/06/20 0400 01/06/20 0500 01/06/20 0600 01/06/20 0837  BP: 110/73  105/68 112/67  Pulse: (!) 106  94 (!) 107  Resp: (!) 25 20 (!) 25 (!) 22  Temp:    98.9 F (37.2 C)  TempSrc:    Oral  SpO2: 97%  100% 98%  Weight:      Height:       SpO2: 98 % O2 Flow Rate (L/min): 3 L/min   Intake/Output Summary (Last 24 hours) at 01/06/2020 0850 Last data filed at 01/06/2020 0400 Gross per 24 hour  Intake 1712.74 ml  Output 1475 ml  Net 237.74 ml   Filed Weights   01/04/20 0400 01/05/20 0412 01/06/20 0055  Weight: 78 kg 77.6 kg 76.7 kg    Exam: General exam: In no acute distress. Respiratory system: Good air movement and clear to auscultation. Cardiovascular system: S1 & S2 heard, RRR. No JVD. Gastrointestinal system: Abdomen is nondistended, soft and nontender.  Extremities: No pedal edema. Skin: No rashes, lesions or ulcers  Data Reviewed:    Labs: Basic Metabolic Panel: Recent Labs  Lab 12/30/19 1615 12/30/19 1615 01/01/20 1538 01/01/20 1538 01/02/20 0250 01/02/20 0250 01/03/20 2133 01/04/20 2326  NA 131*  --  134*  --  134*  --  133* 135  K 4.0   < > 3.7   < > 3.7   < > 4.0 3.9  CL 93*  --  99  --  99   --  99 101  CO2 26  --  25  --  25  --  24 25  GLUCOSE 127*  --  138*  --  98  --  112* 102*  BUN 13  --  14  --  13  --  9 17  CREATININE 0.76  --  0.88  --  0.76  --  0.80 0.82  CALCIUM 8.4*  --  7.9*  --  8.1*  --  8.0* 7.9*  PHOS  --   --   --   --   --   --  5.3*  --    < > = values in this interval not displayed.   GFR Estimated Creatinine Clearance: 105.9 mL/min (by C-G formula based on SCr of 0.82 mg/dL). Liver Function Tests: Recent Labs  Lab 12/30/19 1615 01/01/20 1538 01/02/20 0250 01/03/20 2133 01/04/20 2326  AST 132* 134* 106*  --  50*  ALT 281* 209* 193*  --  98*  ALKPHOS  --  289* 243*  --  173*  BILITOT 1.3* 0.7 0.9  --  1.1  PROT 5.9* 5.6* 5.7*  --  5.0*  ALBUMIN  --  1.7* 1.7* 1.6* 1.5*   No results for input(s): LIPASE, AMYLASE in the last 168 hours. Recent Labs  Lab 01/01/20 1540 01/03/20 2211  AMMONIA 29 26   Coagulation profile Recent Labs  Lab 01/01/20 1538 01/02/20 0250 01/03/20 2129  INR 1.3* 1.2 1.3*   COVID-19 Labs  Recent Labs    01/04/20 2326  DDIMER 0.91*    Lab Results  Component Value Date   SARSCOV2NAA NEGATIVE 01/01/2020   [place] NEGATIVE 12/15/2019    CBC: Recent Labs  Lab 12/30/19 1615 12/30/19 1615 01/01/20 1538 01/01/20 2247 01/02/20 0250 01/03/20 2129 01/04/20 2326  WBC 10.0   < > 7.7 5.1 7.1 8.0 7.2  NEUTROABS 8,010*  --  6.1  --   --   --  5.7  HGB 9.7*   < > 7.8* 7.2* 8.2* 7.2* 7.0*  HCT 29.3*   < > 26.7* 24.0* 28.1* 24.4* 23.6*  MCV 77.3*   < > 85.9 84.8 84.4 82.2 83.1  PLT 329   < > 252 196 242 248 219   < > = values in this interval not displayed.   Cardiac Enzymes: No results for input(s): CKTOTAL, CKMB, CKMBINDEX, TROPONINI in the last 168 hours. BNP (last 3 results) No results for input(s): PROBNP in the last 8760 hours. CBG: Recent Labs  Lab 01/01/20 1516 01/04/20 2217  GLUCAP 148* 96   D-Dimer: Recent Labs    01/04/20 2326  DDIMER 0.91*   Hgb A1c: No results for input(s):  HGBA1C in the last 72 hours. Lipid Profile: No results for input(s): CHOL, HDL, LDLCALC, TRIG, CHOLHDL, LDLDIRECT in the last 72 hours. Thyroid function studies: No results for input(s): TSH, T4TOTAL, T3FREE, THYROIDAB in the last 72 hours.  Invalid input(s): FREET3 Anemia work up: No results for input(s): VITAMINB12, FOLATE, FERRITIN, TIBC, IRON, RETICCTPCT in the last 72 hours. Sepsis Labs: Recent Labs  Lab 01/01/20 1538 01/01/20 1541 01/01/20 2247 01/02/20 0250 01/03/20 2129 01/04/20 2326  WBC   < >  --  5.1 7.1 8.0 7.2  LATICACIDVEN  --  1.5 1.0  --   --  0.9   < > = values in this interval not displayed.   Microbiology Recent Results (from the past 240 hour(s))  Culture, blood (routine x 2)     Status: None (Preliminary result)   Collection Time: 01/01/20  3:25 PM   Specimen: BLOOD RIGHT FOREARM  Result Value Ref Range Status   Specimen Description BLOOD RIGHT FOREARM  Final   Special Requests NONE  Final   Culture   Final    NO GROWTH 4 DAYS Performed at [facility], [address], [place], [place] [address]    Report Status PENDING  Incomplete  Urine culture     Status: None   Collection Time: 01/01/20  3:25 PM   Specimen: Urine, Random  Result Value Ref Range Status   Specimen Description URINE, RANDOM  Final   Special Requests NONE  Final   Culture   Final    NO GROWTH Performed at [facility], [place] [address] [place],  [address]    Report Status 01/02/2020 FINAL  Final  Respiratory Panel by RT PCR (Flu A&B, Covid) - Nasopharyngeal  Swab     Status: None   Collection Time: 01/01/20  3:38 PM   Specimen: Nasopharyngeal Swab  Result Value Ref Range Status   SARS Coronavirus 2 by RT PCR NEGATIVE NEGATIVE Final    Comment: (NOTE) SARS-CoV-2 target nucleic acids are NOT DETECTED.  The SARS-CoV-2 RNA is generally detectable in upper respiratoy specimens during the acute phase of infection. The lowest concentration of SARS-CoV-2 viral  copies this assay can detect is 131 copies/mL. A negative result does not preclude SARS-Cov-2 infection and should not be used as the sole basis for treatment or other patient management decisions. A negative result may occur with  improper specimen collection/handling, submission of specimen other than nasopharyngeal swab, presence of viral mutation(s) within the areas targeted by this assay, and inadequate number of viral copies (<131 copies/mL). A negative result must be combined with clinical observations, patient history, and epidemiological information. The expected result is Negative.  Fact Sheet for Patients:  PinkCheek.be  Fact Sheet for Healthcare Providers:  GravelBags.it  This test is no t yet approved or cleared by the [place] FDA and  has been authorized for detection and/or diagnosis of SARS-CoV-2 by FDA under an Emergency Use Authorization (EUA). This EUA will remain  in effect (meaning this test can be used) for the duration of the COVID-19 declaration under Section 564(b)(1) of the Act, 21 U.S.C. section 360bbb-3(b)(1), unless the authorization is terminated or revoked sooner.     Influenza A by PCR NEGATIVE NEGATIVE Final   Influenza B by PCR NEGATIVE NEGATIVE Final    Comment: (NOTE) The Xpert Xpress SARS-CoV-2/FLU/RSV assay is intended as an aid in  the diagnosis of influenza from Nasopharyngeal swab specimens and  should not be used as a sole basis for treatment. Nasal washings and  aspirates are unacceptable for Xpert Xpress SARS-CoV-2/FLU/RSV  testing.  Fact Sheet for Patients: PinkCheek.be  Fact Sheet for Healthcare Providers: GravelBags.it  This test is not yet approved or cleared by the [place] FDA and  has been authorized for detection and/or diagnosis of SARS-CoV-2 by  FDA under an Emergency Use Authorization (EUA). This  EUA will remain  in effect (meaning this test can be used) for the duration of the  Covid-19 declaration under Section 564(b)(1) of the Act, 21  U.S.C. section 360bbb-3(b)(1), unless the authorization is  terminated or revoked. Performed at [facility], [place] [address], [place], [place] [address]   Culture, blood (routine x 2)     Status: None (Preliminary result)   Collection Time: 01/01/20  4:34 PM   Specimen: BLOOD LEFT HAND  Result Value Ref Range Status   Specimen Description BLOOD LEFT HAND  Final   Special Requests   Final    BOTTLES DRAWN AEROBIC AND ANAEROBIC Blood Culture adequate volume   Culture   Final    NO GROWTH 4 DAYS Performed at [facility], [place] [address] [place], [place] [address]    Report Status PENDING  Incomplete     Medications:    metoprolol tartrate  25 mg Oral BID   multivitamin with minerals  1 tablet Oral Daily   pantoprazole  40 mg Oral QAC breakfast   sodium chloride flush  3 mL Intravenous Q12H   thiamine  100 mg Oral Daily   Continuous Infusions:  sodium chloride     sodium chloride 75 mL/hr at 01/06/20 0152      LOS: 5 days   Charlynne Cousins  [facility]  01/06/2020, 8:50 AM

## 2020-01-06 NOTE — Progress Notes (Signed)
CRITICAL VALUE ALERT  Critical Value: hgb:6.5  Date & Time Notied:  01/06/2020 7867  Provider Notified: Olevia Bowens MD

## 2020-01-06 NOTE — Progress Notes (Signed)
This RN called and spoked Lorrin Goodell MD  Regarding MRI information.

## 2020-01-06 NOTE — Progress Notes (Signed)
PT Cancellation Note  Patient Details Name: Brian Ochoa MRN: [id number] DOB: 1960-07-04   Cancelled Treatment:    Reason Eval/Treat Not Completed: (P) Patient at procedure or test/unavailable (Pt off unit for MRI this session.  Will defer tx at this time.)   [name] [name] 01/06/2020, 11:48 AM  Erasmo Leventhal , PTA Acute Rehabilitation Services Pager [phone] Office [phone]

## 2020-01-07 ENCOUNTER — Encounter (HOSPITAL_COMMUNITY): Payer: Self-pay | Admitting: Radiology

## 2020-01-07 DIAGNOSIS — Z515 Encounter for palliative care: Secondary | ICD-10-CM

## 2020-01-07 DIAGNOSIS — Z66 Do not resuscitate: Secondary | ICD-10-CM

## 2020-01-07 DIAGNOSIS — Z7189 Other specified counseling: Secondary | ICD-10-CM

## 2020-01-07 DIAGNOSIS — C799 Secondary malignant neoplasm of unspecified site: Secondary | ICD-10-CM

## 2020-01-07 LAB — TYPE AND SCREEN
ABO/RH(D): A POS
Antibody Screen: NEGATIVE
Unit division: 0

## 2020-01-07 LAB — BASIC METABOLIC PANEL
Anion gap: 9 (ref 5–15)
BUN: 15 mg/dL (ref 6–20)
CO2: 26 mmol/L (ref 22–32)
Calcium: 7.8 mg/dL — ABNORMAL LOW (ref 8.9–10.3)
Chloride: 103 mmol/L (ref 98–111)
Creatinine, Ser: 0.64 mg/dL (ref 0.61–1.24)
GFR, Estimated: >60 mL/min (ref >60)
Glucose, Bld: 146 mg/dL — ABNORMAL HIGH (ref 70–99)
Potassium: 3.7 mmol/L (ref 3.5–5.1)
Sodium: 138 mmol/L (ref 135–145)

## 2020-01-07 LAB — CBC
HCT: 23.8 % — ABNORMAL LOW (ref 39.0–52.0)
Hemoglobin: 7 g/dL — ABNORMAL LOW (ref 13.0–17.0)
MCH: 24.6 pg — ABNORMAL LOW (ref 26.0–34.0)
MCHC: 29.4 g/dL — ABNORMAL LOW (ref 30.0–36.0)
MCV: 83.8 fL (ref 80.0–100.0)
Platelets: 196 10*3/uL (ref 150–400)
RBC: 2.84 MIL/uL — ABNORMAL LOW (ref 4.22–5.81)
RDW: 17.6 % — ABNORMAL HIGH (ref 11.5–15.5)
WBC: 6.7 10*3/uL (ref 4.0–10.5)
nRBC: 0 % (ref 0.0–0.2)

## 2020-01-07 LAB — BPAM RBC
Blood Product Expiration Date: 202111012359
ISSUE DATE / TIME: 202110141741
Unit Type and Rh: 6200

## 2020-01-07 MED ORDER — MORPHINE SULFATE 10 MG/5ML PO SOLN
2.5000 mg | ORAL | 0 refills | Status: AC | PRN
Start: 2020-01-07 — End: 2020-01-14

## 2020-01-07 MED ORDER — ONDANSETRON HCL 4 MG/2ML IJ SOLN: 4.0000 mg | Freq: Four times a day (QID) | INTRAMUSCULAR | Status: DC | PRN

## 2020-01-07 MED ORDER — ACETAMINOPHEN 325 MG PO TABS: 650.0000 mg | ORAL_TABLET | Freq: Four times a day (QID) | ORAL | 0 refills | Status: AC | PRN

## 2020-01-07 MED ORDER — ACETAMINOPHEN 650 MG RE SUPP: 650.0000 mg | Freq: Four times a day (QID) | RECTAL | 0 refills | Status: AC | PRN

## 2020-01-07 MED ORDER — MORPHINE SULFATE 10 MG/5ML PO SOLN: 2.5000 mg | ORAL | Status: DC | PRN

## 2020-01-07 MED ORDER — HYPROMELLOSE (GONIOSCOPIC) 2.5 % OP SOLN: 2.0000 [drp] | Freq: Four times a day (QID) | OPHTHALMIC | Status: DC | PRN

## 2020-01-07 MED ORDER — HYPROMELLOSE (GONIOSCOPIC) 2.5 % OP SOLN: 2.0000 [drp] | Freq: Four times a day (QID) | OPHTHALMIC | 12 refills | Status: AC | PRN

## 2020-01-07 MED ORDER — GLYCOPYRROLATE 0.2 MG/ML IJ SOLN: 0.4000 mg | Freq: Four times a day (QID) | INTRAMUSCULAR | Status: DC | PRN

## 2020-01-07 MED ORDER — BISACODYL 10 MG RE SUPP: 10.0000 mg | Freq: Every day | RECTAL | 0 refills | Status: AC | PRN

## 2020-01-07 MED ORDER — MORPHINE SULFATE (PF) 2 MG/ML IV SOLN
2.0000 mg | INTRAVENOUS | Status: DC | PRN
  Administered 2020-01-07: 2 mg via INTRAVENOUS
  Filled 2020-01-07: qty 1

## 2020-01-07 MED ORDER — BISACODYL 10 MG RE SUPP: 10.0000 mg | Freq: Every day | RECTAL | Status: DC | PRN

## 2020-01-07 NOTE — Progress Notes (Signed)
Brief Neuro Update:  Patient is now comfort care, we will not pursue LP. Please let us know if you need any help from [place]. We will signoff. Thank you for this interesting consult.

## 2020-01-07 NOTE — TOC Progression Note (Addendum)
Transition of Care [facility]) - Progression Note    Patient Details  Name: Brian Ochoa MRN: [id number] Date of Birth: 1960-08-17  Transition of Care [facility]) CM/SW Contact  Brian Mayo, RN Phone Number: 01/07/2020, 10:04 AM  Clinical Narrative:    Patient is from home with wife, Brian Ochoa informed this am by Brian Ochoa with Palliative, wife wants patient to go home with hospice [facility].  Brian Ochoa spoke with wife to confirm, she states yes this is what she wants.  Her phone is Brian Ochoa [phone].  He has a walker at home, she will need hospital bed, bsc, oxygen, and a bedside table.  The physical address from wife is [place], [place] [address].  Brian Ochoa made referral to Brian Ochoa with [facility], she will contact Brian Ochoa for the DME for patient and she will contact the wife.         Expected Discharge Plan and Services                                                 Social Determinants of Health (SDOH) Interventions    Readmission Risk Interventions No flowsheet data found.

## 2020-01-07 NOTE — Progress Notes (Signed)
   01/07/20 0600  Assess: MEWS Score  BP 134/73  Pulse Rate 82  ECG Heart Rate 84  SpO2 95 %  Assess: MEWS Score  MEWS Temp 0  MEWS Systolic 0  MEWS Pulse 0  MEWS RR 2  MEWS LOC 2  MEWS Score 4  MEWS Score Color Red  Assess: if the MEWS score is Yellow or Red  Were vital signs taken at a resting state? Yes  Focused Assessment No change from prior assessment  Early Detection of Sepsis Score *See Row Information* Low  MEWS guidelines implemented *See Row Information* No, altered LOC is baseline  Document  Patient Outcome Stabilized after interventions  Progress note created (see row info) Yes

## 2020-01-07 NOTE — Progress Notes (Signed)
   01/06/20 2245  Assess: MEWS Score  BP 113/70  ECG Heart Rate (!) 101  Assess: MEWS Score  MEWS Temp 0  MEWS Systolic 0  MEWS Pulse 1  MEWS RR 2  MEWS LOC 0  MEWS Score 3  MEWS Score Color Yellow  Assess: if the MEWS score is Yellow or Red  Were vital signs taken at a resting state? Yes  Focused Assessment No change from prior assessment  Early Detection of Sepsis Score *See Row Information* Low  MEWS guidelines implemented *See Row Information* No, previously yellow, continue vital signs every 4 hours  Document  Patient Outcome Stabilized after interventions  Progress note created (see row info) Yes

## 2020-01-07 NOTE — Progress Notes (Signed)
Physical Therapy Discharge Patient Details Name: Jereld Presti MRN: [id number] DOB: September 02, 1960 Today's Date: 01/07/2020 Time:  -     Patient discharged from PT services secondary to medical decline - will need to re-order PT to resume therapy services.  Please see latest therapy progress note for current level of functioning and progress toward goals.    Progress and discharge plan discussed with patient and/or caregiver: Patient unable to participate in discharge planning and no caregivers available  GP     Denice Paradise 01/07/2020, 11:11 AM  Lynnwood Beckford W,PT Acute Rehabilitation Services Pager:  [phone]  Office:  [phone]

## 2020-01-07 NOTE — Discharge Summary (Signed)
Physician Discharge Summary  Patient ID: Brian Ochoa MRN: [id number] DOB/AGE: 1960-08-31 59 y.o.  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  Patient be discharged home with hospice for comfort measures only.  Hospital Course: Patient is a 59 year old male past medical history significant for TIA.  Patient was admitted with encephalopathy.  Work-up done revealed poorly differentiated malignancy that was consistent with non-small cell carcinoma.  Lymphadenopathy of the left neck, right axilla, left groin and hepatosplenomegaly were also noted.  Patient remained unresponsive.  Oncology and neurology team were consulted to assist with patient's care.  Hospice and palliative care team again virtually consulted.  After extensive discussion, comfort directed care was elected.  Patient be discharged home with hospice, with the goal of care being comfort measures only.  Consults: -Oncology  -Neurology -Palliative care  Significant Diagnostic Studies:  -MRI brain with and without contrast "There are now at least 14 punctate acute/early subacute infarcts which are predominantly within the bilateral cerebral hemispheres, with an additional infarct in the right middle cerebellar peduncle. Several of these lesions are new as compared to 01/02/2020 (previously numbering 7). Additionally, there has been  significant interval progression of now extensive patchy cortical/subcortical T2/FLAIR hyperintensity throughout both cerebral hemispheres, as well as new small foci of T2/FLAIR hyperintensity within the pons, right middle cerebellar peduncle and right cerebellar white matter. There are now extensive multifocal microhemorrhages and petechial hemorrhage within the supratentorial and infratentorial brain, significantly progressed. Multifocal abnormal sulcal enhancement is also questioned. Favored differential considerations include vasculitis, autoimmune cerebritis and infectious cerebritis. Given the numerous small infarcts, a superimposed embolic process is difficult to exclude. Demyelinating disease is considered less likely. Correlation with CSF analysis is recommended".  MRA head/neck:: 1. No intracranial large vessel occlusion or proximal high-grade arterial stenosis 2. No significant vessel irregularity is appreciated. 3. Catheter based angiography may be helpful for further evaluation of the small arterial vessels. 4. Extensive cervical lymphadenopathy is subsequently noted on the concurrently performed MRA of the neck. In addition to the previously provided differential considerations, intravascular lymphoma is a strong differential consideration for the described intracranial findings. Discharge Exam: Blood pressure 122/67, pulse 68, temperature 98.9 F (37.2 C), temperature source Oral, resp. rate 17, height 5\' 11"  (1.803 m), weight 79.4 kg, SpO2 97 %.  Disposition: Discharge disposition: 01-Home or Self Care  Discharge Instructions    Diet - low sodium heart healthy   Complete by: As directed    Increase activity slowly   Complete by: As directed      Allergies as of 01/07/2020   No Known Allergies     Medication List    STOP taking these medications   albuterol 108 (90 Base) MCG/ACT inhaler Commonly known as: VENTOLIN HFA   aspirin 325 MG tablet    benzonatate 100 MG capsule Commonly known as: TESSALON   multivitamin capsule   pantoprazole 40 MG tablet Commonly known as: PROTONIX  TAKE these medications   acetaminophen 325 MG tablet Commonly known as: TYLENOL Take 2 tablets (650 mg total) by mouth every 6 (six) hours as needed for up to 14 days for mild pain (or Fever >/= 101). What changed:   when to take this  reasons to take this   acetaminophen 650 MG suppository Commonly known as: TYLENOL Place 1 suppository (650 mg total) rectally every 6 (six) hours as needed for mild pain (or Fever >/= 101). What changed: You were already taking a medication with the same name, and this prescription was added. Make sure you understand how and when to take each.   bisacodyl 10 MG suppository Commonly known as: DULCOLAX Place 1 suppository (10 mg total) rectally daily as needed for up to 14 days (Constipation).   hydroxypropyl methylcellulose / hypromellose 2.5 % ophthalmic solution Commonly known as: ISOPTO TEARS / GONIOVISC Place 2 drops into both eyes 4 (four) times daily as needed for dry eyes.   morphine 10 MG/5ML solution Take 1.3 mLs (2.6 mg total) by mouth every 3 (three) hours as needed for up to 7 days (Dyspnea/Pain).       [place] Follow up.   Why: home hospice Contact information: [address] [place] [address] [phone]               Signed: Bonnell Public 01/07/2020, 4:12 PM

## 2020-01-07 NOTE — Progress Notes (Signed)
   01/07/20 0300  Assess: MEWS Score  BP 127/69  Pulse Rate 78  ECG Heart Rate 78  SpO2 97 %  Assess: MEWS Score  MEWS Temp 0  MEWS Systolic 0  MEWS Pulse 0  MEWS RR 1  MEWS LOC 2  MEWS Score 3  MEWS Score Color Yellow  Assess: if the MEWS score is Yellow or Red  Were vital signs taken at a resting state? Yes  Focused Assessment No change from prior assessment  Early Detection of Sepsis Score *See Row Information* Low  MEWS guidelines implemented *See Row Information* No, altered LOC is baseline  Document  Patient Outcome Stabilized after interventions  Progress note created (see row info) Yes

## 2020-01-07 NOTE — Progress Notes (Signed)
attempted to give report to [facility]  Did they stated they would call  Back

## 2020-01-07 NOTE — Progress Notes (Signed)
   01/07/20 0345  Assess: MEWS Score  Temp 98.9 F (37.2 C)  BP 124/65  Pulse Rate 79  ECG Heart Rate 77  Resp (!) 26  SpO2 99 %  Assess: MEWS Score  MEWS Temp 0  MEWS Systolic 0  MEWS Pulse 0  MEWS RR 2  MEWS LOC 2  MEWS Score 4  MEWS Score Color Red  Assess: if the MEWS score is Yellow or Red  Were vital signs taken at a resting state? Yes  Focused Assessment No change from prior assessment  Early Detection of Sepsis Score *See Row Information* Low  MEWS guidelines implemented *See Row Information* No, altered LOC is baseline  Document  Patient Outcome Stabilized after interventions  Progress note created (see row info) Yes

## 2020-01-07 NOTE — Progress Notes (Signed)
   01/06/20 1945  Assess: MEWS Score  Temp 98.6 F (37 C)  BP 102/72  Pulse Rate (!) 101  ECG Heart Rate (!) 103  Resp (!) 36  SpO2 98 %  Assess: MEWS Score  MEWS Temp 0  MEWS Systolic 0  MEWS Pulse 1  MEWS RR 3  MEWS LOC 0  MEWS Score 4  MEWS Score Color Red  Assess: if the MEWS score is Yellow or Red  Were vital signs taken at a resting state? No  Focused Assessment No change from prior assessment  Early Detection of Sepsis Score *See Row Information* Low  MEWS guidelines implemented *See Row Information* No, previously red, continue vital signs every 4 hours  Document  Patient Outcome Stabilized after interventions  Progress note created (see row info) Yes

## 2020-01-07 NOTE — Progress Notes (Signed)
   01/06/20 2157  Assess: MEWS Score  Temp 98.8 F (37.1 C)  BP 115/73  Pulse Rate 86  Resp (!) 32  SpO2 100 %  O2 Device Nasal Cannula  O2 Flow Rate (L/min) 3 L/min  Assess: MEWS Score  MEWS Temp 0  MEWS Systolic 0  MEWS Pulse 0  MEWS RR 2  MEWS LOC 0  MEWS Score 2  MEWS Score Color Yellow  Assess: if the MEWS score is Yellow or Red  Were vital signs taken at a resting state? No  Focused Assessment No change from prior assessment  Early Detection of Sepsis Score *See Row Information* Low  MEWS guidelines implemented *See Row Information* No, previously yellow, continue vital signs every 4 hours  Document  Patient Outcome Stabilized after interventions  Progress note created (see row info) Yes

## 2020-01-07 NOTE — Progress Notes (Signed)
   01/07/20 0100  Assess: MEWS Score  BP 120/79  Pulse Rate 97  ECG Heart Rate 89  SpO2 98 %  O2 Device Nasal Cannula  O2 Flow Rate (L/min) 3 L/min  Assess: MEWS Score  MEWS Temp 0  MEWS Systolic 0  MEWS Pulse 0  MEWS RR 1  MEWS LOC 2  MEWS Score 3  MEWS Score Color Yellow  Assess: if the MEWS score is Yellow or Red  Were vital signs taken at a resting state? Yes  Focused Assessment No change from prior assessment  Early Detection of Sepsis Score *See Row Information* Low  MEWS guidelines implemented *See Row Information* No, altered LOC is baseline  Document  Patient Outcome Stabilized after interventions  Progress note created (see row info) Yes

## 2020-01-07 NOTE — Progress Notes (Signed)
This chaplain responded to PMT consult for EOL spiritual care.  The Pt. is resting comfortably at  time of visit.The Pt. father-Ike is bedside as the Pt. wife-Pamela coordinates care with Hospice.  The family's desire is for the Pt. to go home with Hospice today.  Ike shares the Pt. has a heart of gold and is loved by many.  Ike shares the Pt. faith is strong and contagious, allowing Ike to sit beside his son and know God's love. Ike accepted prayer for him and the Pt.  F/U spiritual care is available as needed.

## 2020-01-07 NOTE — Progress Notes (Signed)
PT Cancellation Note  Patient Details Name: Terald Jump MRN: [id number] DOB: 17-Apr-1960   Cancelled Treatment:    Reason Eval/Treat Not Completed: Other (comment) (Sign off due to comfort care/ family taking pt home today)   Denice Paradise 01/07/2020, 11:09 AM  Orry Sigl W,PT Acute Rehabilitation Services Pager:  [phone]  Office:  [phone]

## 2020-01-07 NOTE — TOC Transition Note (Addendum)
Transition of Care [facility]) - CM/SW Discharge Note   Patient Details  Name: Brian Ochoa MRN: [id number] Date of Birth: June 03, 1960  Transition of Care [facility]) CM/SW Contact:  Zenon Mayo, RN Phone Number: 01/07/2020, 10:13 AM   Clinical Narrative:    Patient is from home with wife, NCM informed this am by Sharyn Lull with Palliative, wife wants patient to go home with hospice of [facility].  NCM spoke with wife to confirm, she states yes this is what she wants.  Her phone is Olin Hauser [phone].  He has a walker at home, she will need hospital bed, bsc, oxygen, and a bedside table.  The physical address from wife is [place], [place] [address].  NCM made referral to Cassandra with [facility] today, she will contact [place] for the DME for patient and she will contact the wife. He will need ambulance transport home at dc.   10/15- NCM received call from Gamal[facility] stating that the DME is at the home and wife is ready to receive the patient.  NCM contacted wife, she is ready to receive patient.  CSW informed MD to put the DC order in , ptar called for transport. They state he has 5 in front of him it will be about 2 hrs before they pick him up.     Final next level of care: Home w Hospice Care Barriers to Discharge: Equipment Delay   Patient Goals and CMS Choice Patient states their goals for this hospitalization and ongoing recovery are:: home with hospice      Discharge Placement                       Discharge Plan and Services                DME Arranged:  (DME will be supplied by [facility])         HH Arranged: RN [facility] Agency: [facility] [place] [place] Date [place]: 01/07/20 Time [place]: 1013 Representative spoke with at [place]: [place] ([place]) Interventions     Readmission Risk Interventions No  flowsheet data found.

## 2020-01-07 NOTE — Consult Note (Signed)
Palliative Medicine Inpatient Consult Note  Reason for consult:  Goals of Care "end of life newly diagnose lung cancer"  HPI:  Per intake H&P --> Brian Ochoa is a 59 y.o. male with medical history significant of TIA, and recent admission for altered mental status from 9/22-9/29 who presents again with altered mental status. 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 Identified to have poorly differentiated malignancy consistent with non-small cell lung carcinoma. Oncology spoke to patient and daughter yesterday by telephone and discussed the incurable nature of this cancer.  Palliative care was asked to get involved to further discuss goals of care.  It appears that the patient's wife is interested in hospice of [facility] and has already requested conversations with them.  We will see Brian Ochoa and help he and his family navigate through this difficult time to determine the care that is in his best interest.  Clinical Assessment/Goals of Care: I have reviewed medical records including EPIC notes, labs and imaging, received report from bedside RN, assessed the patient.    I met with Brian Ochoa to further discuss diagnosis prognosis, GOC, EOL wishes, disposition and options.   I introduced Palliative Medicine as specialized medical care for people living with serious illness. It focuses on providing relief from the symptoms and stress of a serious illness. The goal is to improve quality of life for both the patient and the family.  I asked Brian Ochoa to tell me a little bit about Brian Ochoa.  She shares that he is from [facility] where they have spent the majority of their life.  They have been married for 35 years and shared 3 children, a daughter and 2 sons.  They share 4 grandchildren who are identified as the light of Brian Ochoa life.  As far as things that bring him joy he was an avid outdoorsman and enjoyed hunting fishing and golfing.  He has  been saved by the [facility] and ascribes to a [employer].  Prior to hospitalization in September he was able to participate in all the ADLs.  The decline he has endured has been extremely rapid.  I asked Brian Ochoa which she understood about his present illness.  She expressed that she had been told by the oncology team that his cancer is "incurable".  A detailed discussion was had today regarding advanced directives -there are none on file and [place].    Concepts specific to code status, artifical feeding and hydration, continued IV antibiotics and rehospitalization was had. I completed a MOST form today. The patient and family outlined their wishes for the following treatment decisions:  Cardiopulmonary Resuscitation: Do Not Attempt Resuscitation (DNR/No CPR)  Medical Interventions: Comfort Measures: Keep clean, warm, and dry. Use medication by any route, positioning, wound care, and other measures to relieve pain and suffering. Use oxygen, suction and manual treatment of airway obstruction as needed for comfort. Do not transfer to the hospital unless comfort needs cannot be met in current location.  Antibiotics: Determine use of limitation of antibiotics when infection occurs  IV Fluids: No IV fluids (provide other measures to ensure comfort)  Feeding Tube: No feeding tube   The difference between a aggressive medical intervention path  and a palliative comfort care path for this patient at this time was had. We talked about transition to comfort measures in house and what that would entail inclusive of medications to control pain, dyspnea, agitation, nausea, itching, and hiccups. We discussed stopping all uneccessary measures such  as blood draws, needle sticks, and frequent vital signs.   I described hospice as a service for patients for have a life expectancy of < 93month. It preserves dignity and quality at the end phases of life. The focus changes from curative to symptom relief.  [place] [facility] [place]  We talked about a formal consultation being made, and ideally getting [place] sooner rather than later.  Values and goals of care important to patient and family were attempted to be elicited.  At this point per Brian Ochoa most enjoy being at home surrounded by family and close friends.  He was very well known in his community and very well loved.  Utilized reflective listening throughout Brian Ochoa.  Brian Hauserwas reasonably emotional given the difficulties she is enduring at this point time.  Discussed the importance of continued conversation with family and their  medical providers regarding overall plan of care and treatment options, ensuring decisions are within the context of the patients values and GOCs.  Decision Maker: Brian Ochoa(Spouse) [phone] SUMMARY OF RECOMMENDATIONS   DNAR/DNI  MOST Completed, paper copy placed onto the chart electric copy can be found in Vynca  DNR Form Completed, paper copy placed onto the chart electric copy can be found in [place] DNAR/DNI   Symptom Management:  Dyspnea: Pain:  - Morphine 2.589mPO Q3H PRN Fever:  - Tylenol 65049mO/PR Q6H PRN Agitation: Anxiety:  - Haldol 2mg42mV Q6H PRN Nausea:  - Zofran 4mg 71mQ6H PRN  Secretions:  - Glycopyrrolate 0.4mg I23m6H ATC Dry Eyes:  - Artificial Tears PRN Xerostomia:  - BID oral care Urinary Retention:  - Maintain catheter Constipation:  - Bisacodyl 10mg P15mN QDay Spiritual:  - Chaplain consult  Palliative Prophylaxis:   Oral Care, Aspiration precuations  Additional Recommendations (Limitations, Scope, Preferences):  Comfort oriented care   Psycho-social/Spiritual:   Desire for further Chaplaincy support: Yes  Additional Recommendations: Educations on end of life care   Prognosis: Days - Weeks  Discharge  Planning: [facility]   This conversation/these recommendations were discussed with patient primary care team, Dr. Ogbata Marthenia RollingIn: 0900 Time Out: 1030 Total Time: 90 Greater than 50%  of this time was spent counseling and coordinating care related to the above assessment and plan.  [place] Cell Phone: [phone] utilize secure chat with additional questions, if there is no response within 30 minutes please call the above phone number  Palliative Medicine Team providers are available by phone from 7am to 7pm daily and can be reached through the team cell phone.  Should this patient require assistance outside of these hours, please call the patient's attending physician.

## 2020-01-10 ENCOUNTER — Telehealth: Payer: Self-pay | Admitting: *Deleted

## 2020-01-10 NOTE — Telephone Encounter (Signed)
Per D.r Irene Limbo, [facility] and [facility] 1 tested requested.  I notified the pathology dept to send.

## 2020-01-17 MED FILL — Phenylephrine HCl IV Soln 10 MG/ML: INTRAVENOUS | Qty: 1 | Status: AC

## 2020-01-17 MED FILL — Rocuronium Bromide IV Soln Pref Syr 100 MG/10ML (10 MG/ML): INTRAVENOUS | Qty: 10 | Status: AC

## 2020-01-17 MED FILL — Albumin, Human Inj 5%: INTRAVENOUS | Qty: 250 | Status: AC

## 2020-01-17 MED FILL — Propofol IV Emul 200 MG/20ML (10 MG/ML): INTRAVENOUS | Qty: 20 | Status: AC

## 2020-01-17 MED FILL — Succinylcholine Chloride Sol Pref Syr 200 MG/10ML (20 MG/ML): INTRAVENOUS | Qty: 10 | Status: AC

## 2020-01-17 MED FILL — Sugammadex Sodium IV 200 MG/2ML (Base Equivalent): INTRAVENOUS | Qty: 4 | Status: AC

## 2020-01-17 NOTE — Addendum Note (Signed)
Addendum  created 01/17/20 1011 by Duane Boston, MD   Intraprocedure Staff edited

## 2020-01-18 ENCOUNTER — Encounter: Payer: Self-pay | Admitting: *Deleted

## 2020-01-18 NOTE — Progress Notes (Signed)
I received an update from Mikey Bussing NP that patient has gone to Hospice care. I called [facility] and cancelled his testing

## 2020-01-24 DEATH — deceased

## 2020-02-14 LAB — SURGICAL PATHOLOGY
